# Patient Record
Sex: Female | Born: 1965 | Race: White | Hispanic: Yes | Marital: Single | Smoking: Former smoker
Health system: Southern US, Community
[De-identification: ages and names within clinical notes are randomized; demographics above are authoritative.]

## PROBLEM LIST (undated history)

## (undated) DIAGNOSIS — M1710 Unilateral primary osteoarthritis, unspecified knee: Secondary | ICD-10-CM

## (undated) DIAGNOSIS — K760 Fatty (change of) liver, not elsewhere classified: Secondary | ICD-10-CM

## (undated) DIAGNOSIS — M1990 Unspecified osteoarthritis, unspecified site: Secondary | ICD-10-CM

## (undated) DIAGNOSIS — R0789 Other chest pain: Secondary | ICD-10-CM

## (undated) DIAGNOSIS — M25579 Pain in unspecified ankle and joints of unspecified foot: Secondary | ICD-10-CM

## (undated) DIAGNOSIS — N200 Calculus of kidney: Secondary | ICD-10-CM

## (undated) DIAGNOSIS — E119 Type 2 diabetes mellitus without complications: Secondary | ICD-10-CM

## (undated) HISTORY — DX: Other chest pain: R07.89

## (undated) HISTORY — DX: Unspecified osteoarthritis, unspecified site: M19.90

## (undated) HISTORY — DX: Calculus of kidney: N20.0

## (undated) HISTORY — DX: Fatty (change of) liver, not elsewhere classified: K76.0

## (undated) HISTORY — DX: Unilateral primary osteoarthritis, unspecified knee: M17.10

## (undated) HISTORY — DX: Morbid (severe) obesity due to excess calories: E66.01

## (undated) HISTORY — PX: LITHOTRIPSY: SUR834

## (undated) HISTORY — DX: Pain in unspecified ankle and joints of unspecified foot: M25.579

---

## 1998-06-26 ENCOUNTER — Encounter: Payer: Self-pay | Admitting: Obstetrics & Gynecology

## 1998-06-26 ENCOUNTER — Inpatient Hospital Stay (HOSPITAL_COMMUNITY): Admission: AD | Admit: 1998-06-26 | Discharge: 1998-06-26 | Payer: Self-pay | Admitting: Obstetrics & Gynecology

## 1998-06-27 ENCOUNTER — Inpatient Hospital Stay (HOSPITAL_COMMUNITY): Admission: AD | Admit: 1998-06-27 | Discharge: 1998-06-27 | Payer: Self-pay | Admitting: Obstetrics & Gynecology

## 1998-07-04 ENCOUNTER — Encounter: Payer: Self-pay | Admitting: *Deleted

## 1998-07-04 ENCOUNTER — Inpatient Hospital Stay (HOSPITAL_COMMUNITY): Admission: AD | Admit: 1998-07-04 | Discharge: 1998-07-04 | Payer: Self-pay | Admitting: *Deleted

## 1998-07-12 ENCOUNTER — Inpatient Hospital Stay (HOSPITAL_COMMUNITY): Admission: AD | Admit: 1998-07-12 | Discharge: 1998-07-12 | Payer: Self-pay | Admitting: *Deleted

## 1998-07-14 ENCOUNTER — Inpatient Hospital Stay (HOSPITAL_COMMUNITY): Admission: AD | Admit: 1998-07-14 | Discharge: 1998-07-14 | Payer: Self-pay | Admitting: Obstetrics & Gynecology

## 1998-07-23 ENCOUNTER — Inpatient Hospital Stay (HOSPITAL_COMMUNITY): Admission: AD | Admit: 1998-07-23 | Discharge: 1998-07-23 | Payer: Self-pay | Admitting: Obstetrics

## 1998-07-25 ENCOUNTER — Inpatient Hospital Stay (HOSPITAL_COMMUNITY): Admission: EM | Admit: 1998-07-25 | Discharge: 1998-07-25 | Payer: Self-pay | Admitting: Obstetrics & Gynecology

## 1998-08-23 ENCOUNTER — Ambulatory Visit (HOSPITAL_COMMUNITY): Admission: RE | Admit: 1998-08-23 | Discharge: 1998-08-23 | Payer: Self-pay | Admitting: *Deleted

## 1998-09-21 ENCOUNTER — Inpatient Hospital Stay (HOSPITAL_COMMUNITY): Admission: AD | Admit: 1998-09-21 | Discharge: 1998-09-29 | Payer: Self-pay | Admitting: *Deleted

## 1998-09-21 ENCOUNTER — Encounter: Payer: Self-pay | Admitting: *Deleted

## 1998-09-25 ENCOUNTER — Encounter: Payer: Self-pay | Admitting: Obstetrics & Gynecology

## 1998-10-11 ENCOUNTER — Inpatient Hospital Stay (HOSPITAL_COMMUNITY): Admission: AD | Admit: 1998-10-11 | Discharge: 1998-10-11 | Payer: Self-pay | Admitting: *Deleted

## 1998-10-17 ENCOUNTER — Encounter (INDEPENDENT_AMBULATORY_CARE_PROVIDER_SITE_OTHER): Payer: Self-pay | Admitting: Specialist

## 1998-10-17 ENCOUNTER — Ambulatory Visit (HOSPITAL_COMMUNITY): Admission: AD | Admit: 1998-10-17 | Discharge: 1998-10-17 | Payer: Self-pay | Admitting: Obstetrics & Gynecology

## 1998-10-17 ENCOUNTER — Encounter: Payer: Self-pay | Admitting: Obstetrics & Gynecology

## 1998-10-18 ENCOUNTER — Encounter (INDEPENDENT_AMBULATORY_CARE_PROVIDER_SITE_OTHER): Payer: Self-pay | Admitting: Specialist

## 1998-10-18 ENCOUNTER — Inpatient Hospital Stay (HOSPITAL_COMMUNITY): Admission: AD | Admit: 1998-10-18 | Discharge: 1998-10-23 | Payer: Self-pay | Admitting: *Deleted

## 1998-10-19 ENCOUNTER — Encounter: Payer: Self-pay | Admitting: *Deleted

## 1998-10-20 ENCOUNTER — Encounter: Payer: Self-pay | Admitting: *Deleted

## 1999-03-16 ENCOUNTER — Inpatient Hospital Stay (HOSPITAL_COMMUNITY): Admission: AD | Admit: 1999-03-16 | Discharge: 1999-03-16 | Payer: Self-pay | Admitting: *Deleted

## 1999-04-26 ENCOUNTER — Encounter: Admission: RE | Admit: 1999-04-26 | Discharge: 1999-04-26 | Payer: Self-pay | Admitting: Obstetrics & Gynecology

## 1999-04-27 ENCOUNTER — Encounter: Payer: Self-pay | Admitting: Obstetrics & Gynecology

## 1999-04-27 ENCOUNTER — Ambulatory Visit (HOSPITAL_COMMUNITY): Admission: RE | Admit: 1999-04-27 | Discharge: 1999-04-27 | Payer: Self-pay | Admitting: Obstetrics & Gynecology

## 1999-05-01 ENCOUNTER — Inpatient Hospital Stay (HOSPITAL_COMMUNITY): Admission: AD | Admit: 1999-05-01 | Discharge: 1999-05-01 | Payer: Self-pay | Admitting: Obstetrics & Gynecology

## 1999-05-02 ENCOUNTER — Encounter (INDEPENDENT_AMBULATORY_CARE_PROVIDER_SITE_OTHER): Payer: Self-pay | Admitting: Specialist

## 1999-05-02 ENCOUNTER — Encounter: Payer: Self-pay | Admitting: Obstetrics & Gynecology

## 1999-05-02 ENCOUNTER — Ambulatory Visit (HOSPITAL_COMMUNITY): Admission: RE | Admit: 1999-05-02 | Discharge: 1999-05-02 | Payer: Self-pay | Admitting: Obstetrics & Gynecology

## 1999-05-10 ENCOUNTER — Encounter: Admission: RE | Admit: 1999-05-10 | Discharge: 1999-05-10 | Payer: Self-pay | Admitting: Obstetrics & Gynecology

## 1999-05-16 ENCOUNTER — Encounter: Admission: RE | Admit: 1999-05-16 | Discharge: 1999-05-16 | Payer: Self-pay | Admitting: Internal Medicine

## 1999-06-20 ENCOUNTER — Encounter: Admission: RE | Admit: 1999-06-20 | Discharge: 1999-06-20 | Payer: Self-pay | Admitting: Obstetrics & Gynecology

## 1999-07-07 ENCOUNTER — Encounter: Admission: RE | Admit: 1999-07-07 | Discharge: 1999-07-07 | Payer: Self-pay | Admitting: Obstetrics & Gynecology

## 1999-09-01 ENCOUNTER — Encounter: Admission: RE | Admit: 1999-09-01 | Discharge: 1999-09-01 | Payer: Self-pay | Admitting: Internal Medicine

## 1999-09-05 ENCOUNTER — Encounter: Admission: RE | Admit: 1999-09-05 | Discharge: 1999-09-05 | Payer: Self-pay | Admitting: Obstetrics

## 1999-09-08 ENCOUNTER — Encounter: Admission: RE | Admit: 1999-09-08 | Discharge: 1999-09-08 | Payer: Self-pay | Admitting: Internal Medicine

## 1999-11-24 ENCOUNTER — Encounter: Admission: RE | Admit: 1999-11-24 | Discharge: 1999-11-24 | Payer: Self-pay | Admitting: Obstetrics & Gynecology

## 2000-01-10 ENCOUNTER — Inpatient Hospital Stay (HOSPITAL_COMMUNITY): Admission: AD | Admit: 2000-01-10 | Discharge: 2000-01-10 | Payer: Self-pay | Admitting: Obstetrics

## 2000-01-15 ENCOUNTER — Inpatient Hospital Stay (HOSPITAL_COMMUNITY): Admission: AD | Admit: 2000-01-15 | Discharge: 2000-01-15 | Payer: Self-pay | Admitting: Obstetrics

## 2000-01-23 ENCOUNTER — Encounter: Admission: RE | Admit: 2000-01-23 | Discharge: 2000-01-23 | Payer: Self-pay | Admitting: Obstetrics & Gynecology

## 2000-03-07 ENCOUNTER — Emergency Department (HOSPITAL_COMMUNITY): Admission: EM | Admit: 2000-03-07 | Discharge: 2000-03-07 | Payer: Self-pay

## 2000-03-08 ENCOUNTER — Emergency Department (HOSPITAL_COMMUNITY): Admission: EM | Admit: 2000-03-08 | Discharge: 2000-03-09 | Payer: Self-pay | Admitting: Emergency Medicine

## 2000-03-18 ENCOUNTER — Encounter: Payer: Self-pay | Admitting: Urology

## 2000-03-18 ENCOUNTER — Ambulatory Visit (HOSPITAL_COMMUNITY): Admission: RE | Admit: 2000-03-18 | Discharge: 2000-03-18 | Payer: Self-pay | Admitting: Urology

## 2000-12-12 ENCOUNTER — Emergency Department (HOSPITAL_COMMUNITY): Admission: EM | Admit: 2000-12-12 | Discharge: 2000-12-12 | Payer: Self-pay | Admitting: Emergency Medicine

## 2001-02-02 ENCOUNTER — Emergency Department (HOSPITAL_COMMUNITY): Admission: EM | Admit: 2001-02-02 | Discharge: 2001-02-02 | Payer: Self-pay | Admitting: Emergency Medicine

## 2001-06-04 ENCOUNTER — Encounter: Admission: RE | Admit: 2001-06-04 | Discharge: 2001-06-04 | Payer: Self-pay | Admitting: *Deleted

## 2001-06-04 ENCOUNTER — Encounter: Payer: Self-pay | Admitting: Obstetrics and Gynecology

## 2001-06-04 ENCOUNTER — Inpatient Hospital Stay (HOSPITAL_COMMUNITY): Admission: AD | Admit: 2001-06-04 | Discharge: 2001-06-04 | Payer: Self-pay | Admitting: Obstetrics and Gynecology

## 2001-06-16 ENCOUNTER — Encounter: Payer: Self-pay | Admitting: Obstetrics and Gynecology

## 2001-06-16 ENCOUNTER — Ambulatory Visit (HOSPITAL_COMMUNITY): Admission: RE | Admit: 2001-06-16 | Discharge: 2001-06-16 | Payer: Self-pay | Admitting: Obstetrics and Gynecology

## 2001-06-19 ENCOUNTER — Inpatient Hospital Stay (HOSPITAL_COMMUNITY): Admission: AD | Admit: 2001-06-19 | Discharge: 2001-06-19 | Payer: Self-pay | Admitting: *Deleted

## 2001-06-23 ENCOUNTER — Encounter (INDEPENDENT_AMBULATORY_CARE_PROVIDER_SITE_OTHER): Payer: Self-pay | Admitting: *Deleted

## 2001-06-23 ENCOUNTER — Ambulatory Visit (HOSPITAL_COMMUNITY): Admission: AD | Admit: 2001-06-23 | Discharge: 2001-06-23 | Payer: Self-pay | Admitting: *Deleted

## 2001-09-26 ENCOUNTER — Inpatient Hospital Stay (HOSPITAL_COMMUNITY): Admission: RE | Admit: 2001-09-26 | Discharge: 2001-09-26 | Payer: Self-pay | Admitting: *Deleted

## 2001-09-26 ENCOUNTER — Encounter: Payer: Self-pay | Admitting: *Deleted

## 2001-09-29 ENCOUNTER — Inpatient Hospital Stay (HOSPITAL_COMMUNITY): Admission: AD | Admit: 2001-09-29 | Discharge: 2001-09-29 | Payer: Self-pay | Admitting: Obstetrics and Gynecology

## 2001-10-06 ENCOUNTER — Inpatient Hospital Stay (HOSPITAL_COMMUNITY): Admission: AD | Admit: 2001-10-06 | Discharge: 2001-10-06 | Payer: Self-pay | Admitting: Obstetrics and Gynecology

## 2001-10-13 ENCOUNTER — Inpatient Hospital Stay (HOSPITAL_COMMUNITY): Admission: AD | Admit: 2001-10-13 | Discharge: 2001-10-13 | Payer: Self-pay | Admitting: Obstetrics and Gynecology

## 2001-11-11 ENCOUNTER — Encounter: Admission: RE | Admit: 2001-11-11 | Discharge: 2001-11-11 | Payer: Self-pay | Admitting: *Deleted

## 2001-11-12 ENCOUNTER — Encounter: Admission: RE | Admit: 2001-11-12 | Discharge: 2001-11-12 | Payer: Self-pay | Admitting: Obstetrics and Gynecology

## 2001-12-12 ENCOUNTER — Encounter: Admission: RE | Admit: 2001-12-12 | Discharge: 2001-12-12 | Payer: Self-pay | Admitting: *Deleted

## 2001-12-26 ENCOUNTER — Encounter: Admission: RE | Admit: 2001-12-26 | Discharge: 2001-12-26 | Payer: Self-pay | Admitting: *Deleted

## 2002-01-02 ENCOUNTER — Ambulatory Visit (HOSPITAL_COMMUNITY): Admission: RE | Admit: 2002-01-02 | Discharge: 2002-01-02 | Payer: Self-pay | Admitting: *Deleted

## 2002-01-09 ENCOUNTER — Encounter: Admission: RE | Admit: 2002-01-09 | Discharge: 2002-01-09 | Payer: Self-pay | Admitting: *Deleted

## 2002-01-23 ENCOUNTER — Encounter: Admission: RE | Admit: 2002-01-23 | Discharge: 2002-04-23 | Payer: Self-pay | Admitting: *Deleted

## 2002-01-23 ENCOUNTER — Emergency Department (HOSPITAL_COMMUNITY): Admission: EM | Admit: 2002-01-23 | Discharge: 2002-01-24 | Payer: Self-pay | Admitting: Podiatry

## 2002-08-18 ENCOUNTER — Emergency Department (HOSPITAL_COMMUNITY): Admission: EM | Admit: 2002-08-18 | Discharge: 2002-08-18 | Payer: Self-pay

## 2002-08-18 ENCOUNTER — Encounter: Payer: Self-pay | Admitting: Emergency Medicine

## 2002-10-22 ENCOUNTER — Encounter: Payer: Self-pay | Admitting: Obstetrics & Gynecology

## 2002-10-22 ENCOUNTER — Inpatient Hospital Stay (HOSPITAL_COMMUNITY): Admission: AD | Admit: 2002-10-22 | Discharge: 2002-10-22 | Payer: Self-pay | Admitting: Obstetrics & Gynecology

## 2002-10-27 ENCOUNTER — Encounter: Admission: RE | Admit: 2002-10-27 | Discharge: 2002-10-27 | Payer: Self-pay | Admitting: *Deleted

## 2002-11-02 ENCOUNTER — Ambulatory Visit (HOSPITAL_COMMUNITY): Admission: RE | Admit: 2002-11-02 | Discharge: 2002-11-02 | Payer: Self-pay | Admitting: Obstetrics & Gynecology

## 2002-11-02 ENCOUNTER — Encounter: Payer: Self-pay | Admitting: Obstetrics & Gynecology

## 2002-11-04 ENCOUNTER — Encounter: Admission: RE | Admit: 2002-11-04 | Discharge: 2002-11-04 | Payer: Self-pay | Admitting: Family Medicine

## 2002-11-18 ENCOUNTER — Encounter: Admission: RE | Admit: 2002-11-18 | Discharge: 2002-11-18 | Payer: Self-pay | Admitting: *Deleted

## 2002-12-02 ENCOUNTER — Encounter: Admission: RE | Admit: 2002-12-02 | Discharge: 2002-12-02 | Payer: Self-pay | Admitting: *Deleted

## 2002-12-06 ENCOUNTER — Inpatient Hospital Stay (HOSPITAL_COMMUNITY): Admission: AD | Admit: 2002-12-06 | Discharge: 2002-12-07 | Payer: Self-pay | Admitting: Family Medicine

## 2002-12-09 ENCOUNTER — Encounter: Admission: RE | Admit: 2002-12-09 | Discharge: 2002-12-09 | Payer: Self-pay | Admitting: *Deleted

## 2002-12-16 ENCOUNTER — Encounter: Admission: RE | Admit: 2002-12-16 | Discharge: 2002-12-16 | Payer: Self-pay | Admitting: *Deleted

## 2002-12-30 ENCOUNTER — Ambulatory Visit (HOSPITAL_COMMUNITY): Admission: RE | Admit: 2002-12-30 | Discharge: 2002-12-30 | Payer: Self-pay | Admitting: Family Medicine

## 2002-12-30 ENCOUNTER — Encounter: Admission: RE | Admit: 2002-12-30 | Discharge: 2002-12-30 | Payer: Self-pay | Admitting: *Deleted

## 2002-12-30 ENCOUNTER — Encounter: Payer: Self-pay | Admitting: Family Medicine

## 2003-01-06 ENCOUNTER — Encounter: Admission: RE | Admit: 2003-01-06 | Discharge: 2003-01-06 | Payer: Self-pay | Admitting: *Deleted

## 2003-01-13 ENCOUNTER — Encounter: Admission: RE | Admit: 2003-01-13 | Discharge: 2003-01-13 | Payer: Self-pay | Admitting: *Deleted

## 2003-01-20 ENCOUNTER — Encounter: Admission: RE | Admit: 2003-01-20 | Discharge: 2003-01-20 | Payer: Self-pay | Admitting: *Deleted

## 2003-01-20 ENCOUNTER — Ambulatory Visit (HOSPITAL_COMMUNITY): Admission: RE | Admit: 2003-01-20 | Discharge: 2003-01-20 | Payer: Self-pay | Admitting: *Deleted

## 2003-01-27 ENCOUNTER — Encounter: Admission: RE | Admit: 2003-01-27 | Discharge: 2003-01-27 | Payer: Self-pay | Admitting: *Deleted

## 2003-02-03 ENCOUNTER — Encounter: Admission: RE | Admit: 2003-02-03 | Discharge: 2003-02-03 | Payer: Self-pay | Admitting: *Deleted

## 2003-02-10 ENCOUNTER — Encounter: Admission: RE | Admit: 2003-02-10 | Discharge: 2003-02-10 | Payer: Self-pay | Admitting: *Deleted

## 2003-02-17 ENCOUNTER — Encounter: Admission: RE | Admit: 2003-02-17 | Discharge: 2003-02-17 | Payer: Self-pay | Admitting: *Deleted

## 2003-02-24 ENCOUNTER — Encounter: Admission: RE | Admit: 2003-02-24 | Discharge: 2003-02-24 | Payer: Self-pay | Admitting: *Deleted

## 2003-03-03 ENCOUNTER — Ambulatory Visit (HOSPITAL_COMMUNITY): Admission: RE | Admit: 2003-03-03 | Discharge: 2003-03-03 | Payer: Self-pay | Admitting: *Deleted

## 2003-03-03 ENCOUNTER — Encounter: Admission: RE | Admit: 2003-03-03 | Discharge: 2003-03-03 | Payer: Self-pay | Admitting: *Deleted

## 2003-03-10 ENCOUNTER — Encounter: Admission: RE | Admit: 2003-03-10 | Discharge: 2003-03-10 | Payer: Self-pay | Admitting: *Deleted

## 2003-03-17 ENCOUNTER — Encounter: Admission: RE | Admit: 2003-03-17 | Discharge: 2003-03-17 | Payer: Self-pay | Admitting: *Deleted

## 2003-03-24 ENCOUNTER — Encounter: Admission: RE | Admit: 2003-03-24 | Discharge: 2003-03-24 | Payer: Self-pay | Admitting: *Deleted

## 2003-03-31 ENCOUNTER — Encounter: Admission: RE | Admit: 2003-03-31 | Discharge: 2003-03-31 | Payer: Self-pay | Admitting: *Deleted

## 2003-04-07 ENCOUNTER — Encounter: Admission: RE | Admit: 2003-04-07 | Discharge: 2003-04-07 | Payer: Self-pay | Admitting: *Deleted

## 2003-04-13 ENCOUNTER — Ambulatory Visit (HOSPITAL_COMMUNITY): Admission: RE | Admit: 2003-04-13 | Discharge: 2003-04-13 | Payer: Self-pay | Admitting: *Deleted

## 2003-04-14 ENCOUNTER — Encounter: Admission: RE | Admit: 2003-04-14 | Discharge: 2003-04-14 | Payer: Self-pay | Admitting: *Deleted

## 2003-04-21 ENCOUNTER — Encounter: Admission: RE | Admit: 2003-04-21 | Discharge: 2003-04-21 | Payer: Self-pay | Admitting: *Deleted

## 2003-04-28 ENCOUNTER — Encounter: Admission: RE | Admit: 2003-04-28 | Discharge: 2003-04-28 | Payer: Self-pay | Admitting: *Deleted

## 2003-04-29 ENCOUNTER — Inpatient Hospital Stay (HOSPITAL_COMMUNITY): Admission: AD | Admit: 2003-04-29 | Discharge: 2003-04-30 | Payer: Self-pay | Admitting: Obstetrics & Gynecology

## 2003-05-05 ENCOUNTER — Encounter: Admission: RE | Admit: 2003-05-05 | Discharge: 2003-05-05 | Payer: Self-pay | Admitting: *Deleted

## 2003-05-06 ENCOUNTER — Ambulatory Visit (HOSPITAL_COMMUNITY): Admission: RE | Admit: 2003-05-06 | Discharge: 2003-05-06 | Payer: Self-pay | Admitting: *Deleted

## 2003-05-10 ENCOUNTER — Encounter: Admission: RE | Admit: 2003-05-10 | Discharge: 2003-05-10 | Payer: Self-pay | Admitting: *Deleted

## 2003-05-12 ENCOUNTER — Encounter: Admission: RE | Admit: 2003-05-12 | Discharge: 2003-05-12 | Payer: Self-pay | Admitting: *Deleted

## 2003-05-17 ENCOUNTER — Encounter: Admission: RE | Admit: 2003-05-17 | Discharge: 2003-05-17 | Payer: Self-pay | Admitting: *Deleted

## 2003-05-19 ENCOUNTER — Ambulatory Visit (HOSPITAL_COMMUNITY): Admission: RE | Admit: 2003-05-19 | Discharge: 2003-05-19 | Payer: Self-pay | Admitting: *Deleted

## 2003-05-19 ENCOUNTER — Encounter: Admission: RE | Admit: 2003-05-19 | Discharge: 2003-05-19 | Payer: Self-pay | Admitting: *Deleted

## 2003-05-20 ENCOUNTER — Encounter: Admission: RE | Admit: 2003-05-20 | Discharge: 2003-05-20 | Payer: Self-pay | Admitting: *Deleted

## 2003-05-23 ENCOUNTER — Inpatient Hospital Stay (HOSPITAL_COMMUNITY): Admission: AD | Admit: 2003-05-23 | Discharge: 2003-05-23 | Payer: Self-pay | Admitting: Obstetrics & Gynecology

## 2003-05-24 ENCOUNTER — Encounter: Admission: RE | Admit: 2003-05-24 | Discharge: 2003-05-24 | Payer: Self-pay | Admitting: *Deleted

## 2003-05-26 ENCOUNTER — Encounter: Admission: RE | Admit: 2003-05-26 | Discharge: 2003-05-26 | Payer: Self-pay | Admitting: Obstetrics & Gynecology

## 2003-05-26 ENCOUNTER — Ambulatory Visit (HOSPITAL_COMMUNITY): Admission: RE | Admit: 2003-05-26 | Discharge: 2003-05-26 | Payer: Self-pay | Admitting: *Deleted

## 2003-05-28 ENCOUNTER — Inpatient Hospital Stay (HOSPITAL_COMMUNITY): Admission: AD | Admit: 2003-05-28 | Discharge: 2003-05-28 | Payer: Self-pay | Admitting: Family Medicine

## 2003-05-31 ENCOUNTER — Encounter: Admission: RE | Admit: 2003-05-31 | Discharge: 2003-05-31 | Payer: Self-pay | Admitting: *Deleted

## 2003-06-02 ENCOUNTER — Encounter: Admission: RE | Admit: 2003-06-02 | Discharge: 2003-06-02 | Payer: Self-pay | Admitting: Obstetrics & Gynecology

## 2003-06-02 ENCOUNTER — Inpatient Hospital Stay (HOSPITAL_COMMUNITY): Admission: AD | Admit: 2003-06-02 | Discharge: 2003-06-05 | Payer: Self-pay | Admitting: Obstetrics and Gynecology

## 2003-06-07 ENCOUNTER — Inpatient Hospital Stay (HOSPITAL_COMMUNITY): Admission: RE | Admit: 2003-06-07 | Discharge: 2003-06-07 | Payer: Self-pay | Admitting: Obstetrics and Gynecology

## 2003-07-20 ENCOUNTER — Encounter: Admission: RE | Admit: 2003-07-20 | Discharge: 2003-07-20 | Payer: Self-pay | Admitting: Internal Medicine

## 2004-07-12 ENCOUNTER — Emergency Department (HOSPITAL_COMMUNITY): Admission: EM | Admit: 2004-07-12 | Discharge: 2004-07-12 | Payer: Self-pay | Admitting: Emergency Medicine

## 2005-02-06 ENCOUNTER — Emergency Department (HOSPITAL_COMMUNITY): Admission: EM | Admit: 2005-02-06 | Discharge: 2005-02-06 | Payer: Self-pay | Admitting: Family Medicine

## 2005-09-14 ENCOUNTER — Inpatient Hospital Stay (HOSPITAL_COMMUNITY): Admission: AD | Admit: 2005-09-14 | Discharge: 2005-09-14 | Payer: Self-pay | Admitting: Obstetrics & Gynecology

## 2005-09-21 ENCOUNTER — Inpatient Hospital Stay (HOSPITAL_COMMUNITY): Admission: RE | Admit: 2005-09-21 | Discharge: 2005-09-21 | Payer: Self-pay | Admitting: Obstetrics & Gynecology

## 2005-09-23 ENCOUNTER — Inpatient Hospital Stay (HOSPITAL_COMMUNITY): Admission: AD | Admit: 2005-09-23 | Discharge: 2005-09-23 | Payer: Self-pay | Admitting: Obstetrics and Gynecology

## 2005-10-04 ENCOUNTER — Ambulatory Visit: Payer: Self-pay | Admitting: Gynecology

## 2005-10-04 ENCOUNTER — Encounter (INDEPENDENT_AMBULATORY_CARE_PROVIDER_SITE_OTHER): Payer: Self-pay | Admitting: Gynecology

## 2005-10-08 ENCOUNTER — Ambulatory Visit: Payer: Self-pay | Admitting: Gynecology

## 2005-11-01 ENCOUNTER — Ambulatory Visit: Payer: Self-pay | Admitting: Gynecology

## 2005-11-02 ENCOUNTER — Ambulatory Visit (HOSPITAL_COMMUNITY): Admission: RE | Admit: 2005-11-02 | Discharge: 2005-11-02 | Payer: Self-pay | Admitting: Family Medicine

## 2005-11-17 ENCOUNTER — Inpatient Hospital Stay (HOSPITAL_COMMUNITY): Admission: AD | Admit: 2005-11-17 | Discharge: 2005-11-17 | Payer: Self-pay | Admitting: Obstetrics & Gynecology

## 2005-11-29 ENCOUNTER — Ambulatory Visit: Payer: Self-pay | Admitting: Family Medicine

## 2005-12-09 ENCOUNTER — Inpatient Hospital Stay (HOSPITAL_COMMUNITY): Admission: AD | Admit: 2005-12-09 | Discharge: 2005-12-09 | Payer: Self-pay | Admitting: Obstetrics & Gynecology

## 2005-12-10 ENCOUNTER — Inpatient Hospital Stay (HOSPITAL_COMMUNITY): Admission: AD | Admit: 2005-12-10 | Discharge: 2005-12-10 | Payer: Self-pay | Admitting: Gynecology

## 2005-12-20 ENCOUNTER — Ambulatory Visit (HOSPITAL_COMMUNITY): Admission: RE | Admit: 2005-12-20 | Discharge: 2005-12-20 | Payer: Self-pay | Admitting: Obstetrics & Gynecology

## 2005-12-27 ENCOUNTER — Ambulatory Visit: Payer: Self-pay | Admitting: Family Medicine

## 2005-12-29 ENCOUNTER — Ambulatory Visit: Payer: Self-pay | Admitting: Obstetrics and Gynecology

## 2005-12-29 ENCOUNTER — Inpatient Hospital Stay (HOSPITAL_COMMUNITY): Admission: AD | Admit: 2005-12-29 | Discharge: 2005-12-29 | Payer: Self-pay | Admitting: Obstetrics & Gynecology

## 2006-01-17 ENCOUNTER — Ambulatory Visit: Payer: Self-pay | Admitting: Family Medicine

## 2006-02-07 ENCOUNTER — Ambulatory Visit: Payer: Self-pay | Admitting: *Deleted

## 2006-02-21 ENCOUNTER — Ambulatory Visit: Payer: Self-pay | Admitting: Family Medicine

## 2006-02-25 ENCOUNTER — Ambulatory Visit: Payer: Self-pay | Admitting: Obstetrics & Gynecology

## 2006-02-25 ENCOUNTER — Ambulatory Visit (HOSPITAL_COMMUNITY): Admission: RE | Admit: 2006-02-25 | Discharge: 2006-02-25 | Payer: Self-pay | Admitting: Obstetrics and Gynecology

## 2006-03-07 ENCOUNTER — Ambulatory Visit: Payer: Self-pay | Admitting: Family Medicine

## 2006-03-11 ENCOUNTER — Ambulatory Visit: Payer: Self-pay | Admitting: *Deleted

## 2006-03-18 ENCOUNTER — Ambulatory Visit: Payer: Self-pay | Admitting: Obstetrics & Gynecology

## 2006-03-21 ENCOUNTER — Ambulatory Visit: Payer: Self-pay | Admitting: Obstetrics & Gynecology

## 2006-03-25 ENCOUNTER — Ambulatory Visit (HOSPITAL_COMMUNITY): Admission: RE | Admit: 2006-03-25 | Discharge: 2006-03-25 | Payer: Self-pay | Admitting: Obstetrics and Gynecology

## 2006-03-25 ENCOUNTER — Ambulatory Visit: Payer: Self-pay | Admitting: Obstetrics & Gynecology

## 2006-03-28 ENCOUNTER — Ambulatory Visit: Payer: Self-pay | Admitting: Obstetrics and Gynecology

## 2006-04-01 ENCOUNTER — Ambulatory Visit (HOSPITAL_COMMUNITY): Admission: RE | Admit: 2006-04-01 | Discharge: 2006-04-01 | Payer: Self-pay | Admitting: Obstetrics and Gynecology

## 2006-04-01 ENCOUNTER — Ambulatory Visit: Payer: Self-pay | Admitting: Family Medicine

## 2006-04-04 ENCOUNTER — Ambulatory Visit: Payer: Self-pay | Admitting: Obstetrics & Gynecology

## 2006-04-08 ENCOUNTER — Ambulatory Visit (HOSPITAL_COMMUNITY): Admission: RE | Admit: 2006-04-08 | Discharge: 2006-04-08 | Payer: Self-pay | Admitting: Obstetrics and Gynecology

## 2006-04-08 ENCOUNTER — Ambulatory Visit: Payer: Self-pay | Admitting: Family Medicine

## 2006-04-11 ENCOUNTER — Ambulatory Visit: Payer: Self-pay | Admitting: Obstetrics and Gynecology

## 2006-04-15 ENCOUNTER — Ambulatory Visit (HOSPITAL_COMMUNITY): Admission: RE | Admit: 2006-04-15 | Discharge: 2006-04-15 | Payer: Self-pay | Admitting: Obstetrics and Gynecology

## 2006-04-15 ENCOUNTER — Ambulatory Visit: Payer: Self-pay | Admitting: Obstetrics & Gynecology

## 2006-04-18 ENCOUNTER — Ambulatory Visit: Payer: Self-pay | Admitting: Family Medicine

## 2006-04-22 ENCOUNTER — Ambulatory Visit: Payer: Self-pay | Admitting: Obstetrics & Gynecology

## 2006-04-22 ENCOUNTER — Ambulatory Visit (HOSPITAL_COMMUNITY): Admission: RE | Admit: 2006-04-22 | Discharge: 2006-04-22 | Payer: Self-pay | Admitting: Family Medicine

## 2006-04-24 ENCOUNTER — Ambulatory Visit: Payer: Self-pay | Admitting: Family Medicine

## 2006-04-24 ENCOUNTER — Inpatient Hospital Stay (HOSPITAL_COMMUNITY): Admission: AD | Admit: 2006-04-24 | Discharge: 2006-04-24 | Payer: Self-pay | Admitting: Obstetrics & Gynecology

## 2006-04-25 ENCOUNTER — Ambulatory Visit: Payer: Self-pay | Admitting: Gynecology

## 2006-04-25 ENCOUNTER — Inpatient Hospital Stay (HOSPITAL_COMMUNITY): Admission: AD | Admit: 2006-04-25 | Discharge: 2006-04-25 | Payer: Self-pay | Admitting: Gynecology

## 2006-04-29 ENCOUNTER — Ambulatory Visit: Payer: Self-pay | Admitting: Obstetrics & Gynecology

## 2006-04-29 ENCOUNTER — Encounter: Payer: Self-pay | Admitting: *Deleted

## 2006-04-29 ENCOUNTER — Inpatient Hospital Stay (HOSPITAL_COMMUNITY): Admission: RE | Admit: 2006-04-29 | Discharge: 2006-05-02 | Payer: Self-pay | Admitting: Obstetrics & Gynecology

## 2006-05-08 ENCOUNTER — Ambulatory Visit: Payer: Self-pay | Admitting: Gynecology

## 2007-09-04 ENCOUNTER — Emergency Department (HOSPITAL_COMMUNITY): Admission: EM | Admit: 2007-09-04 | Discharge: 2007-09-04 | Payer: Self-pay | Admitting: Emergency Medicine

## 2007-09-22 ENCOUNTER — Emergency Department (HOSPITAL_COMMUNITY): Admission: EM | Admit: 2007-09-22 | Discharge: 2007-09-22 | Payer: Self-pay | Admitting: Emergency Medicine

## 2009-10-13 ENCOUNTER — Ambulatory Visit: Payer: Self-pay | Admitting: Obstetrics and Gynecology

## 2009-10-14 ENCOUNTER — Encounter: Payer: Self-pay | Admitting: Family Medicine

## 2009-10-14 ENCOUNTER — Ambulatory Visit: Payer: Self-pay | Admitting: Obstetrics and Gynecology

## 2009-11-03 ENCOUNTER — Ambulatory Visit (HOSPITAL_COMMUNITY): Admission: RE | Admit: 2009-11-03 | Discharge: 2009-11-03 | Payer: Self-pay | Admitting: Family Medicine

## 2009-11-18 ENCOUNTER — Ambulatory Visit: Payer: Self-pay | Admitting: Obstetrics & Gynecology

## 2009-11-23 ENCOUNTER — Ambulatory Visit (HOSPITAL_COMMUNITY): Admission: RE | Admit: 2009-11-23 | Discharge: 2009-11-23 | Payer: Self-pay | Admitting: Obstetrics & Gynecology

## 2010-03-31 ENCOUNTER — Emergency Department (HOSPITAL_COMMUNITY)
Admission: EM | Admit: 2010-03-31 | Discharge: 2010-03-31 | Disposition: A | Discharge status: Other Acute Inpatient Hospital | Payer: Self-pay | Source: Home / Self Care | Admitting: Family Medicine

## 2010-03-31 ENCOUNTER — Emergency Department (HOSPITAL_COMMUNITY)
Admission: EM | Admit: 2010-03-31 | Discharge: 2010-04-01 | Payer: Self-pay | Source: Home / Self Care | Admitting: Emergency Medicine

## 2010-04-03 LAB — POCT URINALYSIS DIPSTICK
Ketones, ur: NEGATIVE mg/dL
Nitrite: NEGATIVE
Protein, ur: NEGATIVE mg/dL
Specific Gravity, Urine: 1.015 (ref 1.005–1.030)
Urine Glucose, Fasting: NEGATIVE mg/dL
Urobilinogen, UA: 0.2 mg/dL (ref 0.0–1.0)
pH: 7 (ref 5.0–8.0)

## 2010-04-03 LAB — DIFFERENTIAL
Basophils Absolute: 0 10*3/uL (ref 0.0–0.1)
Monocytes Relative: 6 % (ref 3–12)

## 2010-04-03 LAB — CBC
Hemoglobin: 13.7 g/dL (ref 12.0–15.0)
MCV: 87.9 fL (ref 78.0–100.0)
Platelets: 289 10*3/uL (ref 150–400)
RBC: 4.86 MIL/uL (ref 3.87–5.11)
RDW: 14.1 % (ref 11.5–15.5)

## 2010-04-03 LAB — COMPREHENSIVE METABOLIC PANEL
AST: 40 U/L — ABNORMAL HIGH (ref 0–37)
Alkaline Phosphatase: 98 U/L (ref 39–117)
Calcium: 8.9 mg/dL (ref 8.4–10.5)
Creatinine, Ser: 0.66 mg/dL (ref 0.4–1.2)
GFR calc non Af Amer: >60 mL/min (ref >60)
Sodium: 135 mEq/L (ref 135–145)

## 2010-04-03 LAB — POCT PREGNANCY, URINE: Preg Test, Ur: NEGATIVE

## 2010-07-28 NOTE — Op Note (Signed)
[facility]  Patient:    Amanda Santiago, Amanda Santiago                       MRN: [id number] Proc. Date: 05/02/99 Adm. Date:  [id number] Disc. Date: [id number] Attending:  Antionette Char CC:         [facility]                           Operative Report  PREOPERATIVE DIAGNOSIS:       Intrauterine pregnancy at 15+ weeks with anencephaly.  POSTOPERATIVE DIAGNOSIS:      Intrauterine pregnancy at 15+ weeks with anencephaly.  OPERATION:                    Suction D&E with ultrasound guidance.  SURGEON:                      Roseanna Rainbow, M.D.  ASSISTANT:  ANESTHESIA:                   Spinal.  COMPLICATIONS:                None.  ESTIMATED BLOOD LOSS:         500 cc.  FINDINGS:                     14 to 16 weeks size anteverted uterus.  Copious products of conception.  DESCRIPTION OF PROCEDURE:     The patient was taken to the operating room. Spinal anesthetic was administered without difficulty.  She was then placed in the dorsal lithotomy position and prepped and draped in the usual sterile fashion.  A weighted speculum was then placed in the vagina and Sims retractors used anteriorly. The anterior lip of the cervix was then grasped with a Christella Hartigan tenaculum.  The cervix was then dilated with [facility] dilators.  A 14 mm suction curet was then advanced nto the lower uterine segment and rotated to evacuate the uterus of the products of  conception.  Ovum-crushing forceps were then used to retrieve products of conception as well.  This technique was alternated until as per ultrasound the uterine cavity was completely evacuated of the products of conception.  Sharp curettage was then performed and a gritty texture was noted.  There was moderate bleeding noted which responded to intravenous Pitocin and massage.  The tenaculum was removed with good hemostasis noted.  The patient tolerated the procedure well. The patient was  taken to the PACU in stable condition.  PATHOLOGY:                    Products of conception. DD:  05/04/99 TD:  05/04/99 Job: [id number]

## 2010-07-28 NOTE — Op Note (Signed)
[facility]  Patient:    DENYLA, CORTESE Visit Number: [id number] MRN: [id number]          Service Type: OBS Location: [facility] Attending Physician:  Enid Cutter Dictated by:   Elinor Dodge, M.D. Proc. Date: 06/23/01 Admit Date:  06/23/2001                             Operative Report  PREOPERATIVE DIAGNOSIS:       Missed abortion.  POSTOPERATIVE DIAGNOSES:      1. Missed abortion.                               2. ______ abortion.  PROCEDURE:                    Dilatation and suction curettage.  SURGEON:                      Elinor Dodge, M.D.  DESCRIPTION OF PROCEDURE:     Under satisfactory IV sedation with the patient in the dorsal lithotomy position, bimanual pelvic examination revealed a uterus about six weeks gestational size, freely moveable and anterior.  The adnexa could not be palpated.  A weighted speculum was placed in the posterior fourchette of the vagina.  The anterior lip of the cervix was grasped with a single-tooth tenaculum.  A total of 10 cc was injected into the paracervical areas for local anesthetic at 4 and 8 oclock.  The cervix was clean and nulliparous.  The uterine cavity sounded to a depth of 8 cm.  The cervical oximetry dilated up to a #7 Hegar dilator.  The uterine cavity was evacuated with a suction curet without incident and seemed to empty at the end of the procedure after it was explored with polyp forceps.  The tenaculum and speculum were removed from the vagina and the patient transferred to the recovery room in satisfactory condition, having tolerated the procedure well. Dictated by:   Elinor Dodge, M.D. Attending Physician:  Enid Cutter DD:  06/23/01 TD:  06/23/01 Job: [id number]

## 2010-07-28 NOTE — Op Note (Signed)
Amanda Santiago, Amanda Santiago       ACCOUNT NO.:  [id number]   MEDICAL RECORD NO.:  [id number]          PATIENT TYPE:  INP   LOCATION:                                FACILITY:  [facility]   PHYSICIAN:  Lesly Dukes, M.D. DATE OF BIRTH:  [birth year]   DATE OF PROCEDURE:  04/29/2006  DATE OF DISCHARGE:                               OPERATIVE REPORT   PREOP DIAGNOSIS:  1. Intrauterine pregnancy at term.  2. Gestational diabetes.  3. Advanced maternal age.  4. Transverse infant.  5. Oligohydramnios.  6. Morbid obesity.  7. Desires permanent sterilization.   POSTOP DIAGNOSIS:  1. Intrauterine pregnancy at term.  2. Gestational diabetes.  3. Advanced maternal age.  4. Transverse infant.  5. Oligohydramnios.  6. Morbid obesity.  7. Desires permanent sterilization.   PROCEDURE:  1. Primary classical cesarean section.  2. Bilateral tubal ligation with Filshie clips.   SURGEON:  Lesly Dukes, M.D.   ASSISTANT:  Allie Bossier, MD and Paticia Stack, MD   ANESTHESIA:  Spinal and local.   SPECIMEN:  Placenta sent to L&D.   ESTIMATED BLOOD LOSS:  800 mL.   URINE OUTPUT:  300 mL.   FLUIDS:  4 liters.   COMPLICATIONS:  None.   FINDINGS:  Viable female infant, Apgars 8 and 10 at one and five minutes  respectively.  Birth weight 6 pounds 11 ounces.   REASON FOR PROCEDURE:  This is a 45 year old gravida 7, para 1-1-4-1 at  10 and 1/7th weeks' gestation who presented to out clinic this morning.  She had testing done and was found to have low fluid with an AFI of 5.  Ultrasound showed baby in transverse position with the back down.  She  was sent for primary cesarean section.   DESCRIPTION OF PROCEDURE:  The patient was taken to the operating room  where her spinal anesthesia was found to be adequate.  She was then  prepped and draped in a normal sterile fashion in dorsal supine position  with a leftward tilt.  A Pfannenstiel skin incision was then made with  the scalpel, and  carried through the underlying layer of fascia.  The  fascia was incised in the midline and the incision extended laterally  with Mayo scissors.  The superior aspect of the fascial incision was  then grasped with the Kocher clamps, elevated, and the underlying rectus  muscles dissected off bluntly.   Attention was then turned to the inferior aspect of the incision which,  in a similar fashion was grasped, tented up with Kocher clamps, and the  rectus muscles dissected off bluntly.  The rectus muscles were then  separated in the midline; and the peritoneum identified, tented up, and  entered sharply with the Metzenbaum scissors.  The peritoneal incision  was then extended superiorly and inferiorly with good visualization of  the bladder.  The bladder blade was inserted, and the vesicouterine  peritoneum identified, grasped with the pickups and entered sharply with  the Metzenbaum scissors.  The incision was then extended laterally and  the bladder flap created digitally, inserted in the lower uterine  segment.  The bladder blade was reinserted and a vertical uterine  incision was made with a scalpel.  The incision was then extended  superiorly with the bandage scissors.   The bladder blade was removed.  The infant was found to be transverse,  back down, with the head to the maternal left.  Baby was rotated and  delivered cephalically.  The nose and mouth were suctioned; and the cord  clamped and cut.  The infant was handed off to the waiting  pediatricians.  Cord blood was obtained.  The placenta was then removed  manually.  The uterus exteriorized and cleared of all clots and debris.  The uterine incision was repaired with #0 chromic in a running locked  fashion. The second imbricating layer of the same suture was then used  to obtain excellent hemostasis.   Attention was then turned to the tubal ligation.  The patient's left  fallopian tube was identified, grasped with Babcock clamps,  and Filshie  clips placed in the isthmus region of the tube; excellent hemostasis was  noted.  The patient's right fallopian tube was identified, grasped with  Babcock clamps, and Filshie clip placed in the isthmus region of the  tube; excellent hemostasis was noted.  The uterus was returned to the  abdomen, and the uterine incision was reinspected with excellent  hemostasis noted.  The gutters were cleared of all clots; and the fascia  was reapproximated with #0 Vicryl in a running locked fashion.  The skin  was closed with staples.  The patient tolerated the procedure well.  Sponge, lap and needle counts were correct x2; 1 gram of Ancef was given  at cord clamp.  The patient was taken to recovery room in stable  condition.     ______________________________  Paticia Stack, MD    ______________________________  Lesly Dukes, M.D.    LNJ/MEDQ  D:  04/29/2006  T:  04/29/2006  Job:  [id number]

## 2010-07-28 NOTE — Discharge Summary (Signed)
Amanda Santiago, Amanda Santiago       ACCOUNT NO.:  [id number]   MEDICAL RECORD NO.:  [id number]          PATIENT TYPE:  INP   LOCATION:  9147                          FACILITY:  [facility]   PHYSICIAN:  Allie Bossier, MD        DATE OF BIRTH:  1965/08/02   DATE OF ADMISSION:  04/29/2006  DATE OF DISCHARGE:                               DISCHARGE SUMMARY   DISCHARGE DIAGNOSES:  1. Primary classical cesarean section.  2. Term delivery.  3. Decreased amniotic fluid with transverse lie in back-down position.  4. Status post bilateral tubal ligation.   DISCHARGE MEDICATIONS:  1. Motrin 600 mg p.o. every 6 hours as needed for pain.  2. Percocet 5/325 mg one to two tablets every 4-6 hours as needed for      pain.  3. Prenatal vitamins daily while breastfeeding.  4. Colace 100 mg p.o. b.i.d. for constipation.   BRIEF HOSPITAL COURSE:  This is a 45 year old Hispanic female who  presented as a G7, P1-1-4-2 at 69 and one-seventh weeks to clinic with  gestational diabetes for BPP and NST.  The patient was found to have  decreased amniotic fluid on BPP as well as transverse lie in the back-  down position.  Thus, the patient was sent for primary classical C-  section.  The patient tolerated the procedure well and underwent  bilateral tubal ligation during the procedure.  The patient delivered a  viable female infant with Apgars of 8 at one minute and 10 at five  minutes.  The patient declined circumcision for her infant.  The patient  is bottle feeding at the time of discharge.   PERTINENT LABORATORY DATA:  OB panel revealed O positive blood type,  antibody negative.  RPR nonreactive.  Rubella immune.  Hepatitis B  surface antigen negative.  GBS negative.  HIV was declined.  Hemoglobin  was 12.1 on April 30, 2006, postoperative day #1 prior to discharge.  The patient did undergo PIH labs postoperatively for mildly elevated  blood pressures in the 130s to 140s systolic with normal diastolics and  PIH labs were within normal limits.  Thus, no magnesium was needed.   The patient will need 2-hour glucose tolerance test at 6 weeks  postpartum visit.   INFANT DATA:  The patient delivered female infant weighing 6 pounds 11  ounces and 19.25 inches in length.  The infant was not circumcised and  was discharged home with the mother with no complications.   DISCHARGE INSTRUCTIONS:  The patient is to avoid heavy lifting and avoid  sexual activity for 6 weeks postpartum.  The patient may follow a  routine diabetic diet.  The patient is to take medications as prescribed  above.  The [facility]  The  patient's [facility] nurse is to remove her staples on postoperative day  #7 or #8, which will be May 06, 2006.   The patient was discharged home in stable condition with her infant.     ______________________________  Drue Dun, M.D.      Allie Bossier, MD  Electronically  Signed    EE/MEDQ  D:  05/02/2006  T:  05/02/2006  Job:  [id number]

## 2010-07-28 NOTE — H&P (Signed)
Amanda Santiago, Amanda Santiago       ACCOUNT NO.:  [id number]   MEDICAL RECORD NO.:  [id number]          PATIENT TYPE:  INP   LOCATION:                                FACILITY:  [facility]   PHYSICIAN:  Allie Bossier, MD        DATE OF BIRTH:  07-Mar-1966   DATE OF ADMISSION:  04/29/2006  DATE OF DISCHARGE:                              HISTORY & PHYSICAL   Mrs. Pitstick is a 45 year old, married Hispanic, gravida 7, para 1-  1-4-1. She arrived to her regularly scheduled routine OB visit today,  mentioning that she had a gush of fluid last night.  Sterile speculum  exam was negative for Nitrazine, negative for fern, negative for pool,  negative for Valsalva.  However, an ultrasound done today showed not  only a transverse lie of the baby, but oligohydramnios (5.5 cm).  Her  AFI last week was 11.5 cm.  Her pregnancy has been complicated by the  fact that she is 45 years old. She has a history of second trimester  loss.  She has gestational diabetes, class A2, and she is morbidly  obese.  She also wishes permanent sterility in the form of a tubal  ligation.  I have discussed with her the 1% failure rate. She declines  alternative forms of birth control.   PAST MEDICAL HISTORY:  As above.   PAST SURGICAL HISTORY:  1. Urethral dilation done at [facility].  2. She had suction D&E for an anencephalic baby in 9528.  3. She had a D&C done in 2003 for miscarriage.   ALLERGIES:  No known drug allergies. No latex allergies.   SOCIAL HISTORY:  Negative for tobacco, alcohol, or drug use.   PHYSICAL EXAMINATION:  VITAL SIGNS:  Weight 305 pounds, blood pressure  141/93.  HEENT:  Normal.  HEART:  Regular rate and rhythm.  ABDOMEN:  Benign.  Obese.  Gravid. The baby does have a transverse lie.  Biophysical profile is 6/8.  Capillary blood glucose today is 77.  Pulse  81.   ASSESSMENT/PLAN:  1. Pregnancy 38+ weeks with gestational diabetes, advanced maternal      age, pregnancy-induced  hypertension, and oligohydramnios.  Will      proceed with primary low transverse cesarean in light of the baby's      transverse lie.  2. Multiparity, desires sterility.  Will plan for BPS.  Risks,      benefits and alternatives of surgery is explained and accepted.      Allie Bossier, MD  Electronically Signed     MCD/MEDQ  D:  04/29/2006  T:  04/29/2006  Job:  [id number]

## 2013-07-03 ENCOUNTER — Ambulatory Visit: Payer: Self-pay

## 2013-09-01 ENCOUNTER — Ambulatory Visit: Payer: Self-pay | Admitting: Internal Medicine

## 2013-09-14 ENCOUNTER — Encounter: Payer: Self-pay | Admitting: Internal Medicine

## 2013-09-14 ENCOUNTER — Ambulatory Visit: Payer: No Typology Code available for payment source | Attending: Internal Medicine | Admitting: Internal Medicine

## 2013-09-14 VITALS — BP 108/68 | HR 66 | Temp 97.8°F | Resp 22 | Ht 64.0 in | Wt 318.8 lb

## 2013-09-14 DIAGNOSIS — R1011 Right upper quadrant pain: Secondary | ICD-10-CM

## 2013-09-14 DIAGNOSIS — Z87891 Personal history of nicotine dependence: Secondary | ICD-10-CM | POA: Insufficient documentation

## 2013-09-14 DIAGNOSIS — M129 Arthropathy, unspecified: Secondary | ICD-10-CM | POA: Insufficient documentation

## 2013-09-14 DIAGNOSIS — Z1239 Encounter for other screening for malignant neoplasm of breast: Secondary | ICD-10-CM

## 2013-09-14 DIAGNOSIS — M79609 Pain in unspecified limb: Secondary | ICD-10-CM | POA: Insufficient documentation

## 2013-09-14 DIAGNOSIS — M25569 Pain in unspecified knee: Secondary | ICD-10-CM | POA: Insufficient documentation

## 2013-09-14 DIAGNOSIS — M79671 Pain in right foot: Secondary | ICD-10-CM

## 2013-09-14 DIAGNOSIS — Z7689 Persons encountering health services in other specified circumstances: Secondary | ICD-10-CM

## 2013-09-14 DIAGNOSIS — Z0000 Encounter for general adult medical examination without abnormal findings: Secondary | ICD-10-CM | POA: Insufficient documentation

## 2013-09-14 DIAGNOSIS — Z7189 Other specified counseling: Secondary | ICD-10-CM

## 2013-09-14 LAB — CBC
HCT: 43.3 % (ref 36.0–46.0)
Hemoglobin: 14.6 g/dL (ref 12.0–15.0)
MCH: 29.5 pg (ref 26.0–34.0)
MCHC: 33.7 g/dL (ref 30.0–36.0)
MCV: 87.5 fL (ref 78.0–100.0)
Platelets: 235 10*3/uL (ref 150–400)
RBC: 4.95 MIL/uL (ref 3.87–5.11)
RDW: 14.6 % (ref 11.5–15.5)
WBC: 7.3 10*3/uL (ref 4.0–10.5)

## 2013-09-14 LAB — LIPID PANEL
Cholesterol: 88 mg/dL (ref 0–200)
HDL: 41 mg/dL (ref >39)
LDL CALC: 24 mg/dL (ref 0–99)
TRIGLYCERIDES: 116 mg/dL (ref <150)
Total CHOL/HDL Ratio: 2.1 Ratio
VLDL: 23 mg/dL (ref 0–40)

## 2013-09-14 LAB — COMPLETE METABOLIC PANEL WITH GFR
ALT: 39 U/L — ABNORMAL HIGH (ref 0–35)
AST: 32 U/L (ref 0–37)
Albumin: 4 g/dL (ref 3.5–5.2)
Alkaline Phosphatase: 79 U/L (ref 39–117)
BILIRUBIN TOTAL: 0.5 mg/dL (ref 0.2–1.2)
BUN: 11 mg/dL (ref 6–23)
CALCIUM: 8.4 mg/dL (ref 8.4–10.5)
CO2: 26 meq/L (ref 19–32)
Chloride: 102 mEq/L (ref 96–112)
Creat: 0.41 mg/dL — ABNORMAL LOW (ref 0.50–1.10)
GFR, Est African American: >89 mL/min
GFR, Est Non African American: >89 mL/min
GLUCOSE: 108 mg/dL — AB (ref 70–99)
Potassium: 4.1 mEq/L (ref 3.5–5.3)
SODIUM: 139 meq/L (ref 135–145)
TOTAL PROTEIN: 7.4 g/dL (ref 6.0–8.3)

## 2013-09-14 LAB — HEMOGLOBIN A1C
Hgb A1c MFr Bld: 6.3 % — ABNORMAL HIGH (ref <5.7)
Mean Plasma Glucose: 134 mg/dL — ABNORMAL HIGH (ref <117)

## 2013-09-14 LAB — TSH: TSH: 0.968 u[IU]/mL (ref 0.350–4.500)

## 2013-09-14 NOTE — Patient Instructions (Signed)
Fascitis plantar (sndrome del espoln en el taln) con rehabilitacin (Plantar Fasciitis, Heel Spur Syndrome, with Rehab) La fascia plantar es una estructura fibrosa, tipo ligamento, de tejido blando que abarca la parte inferior del pie. La fascitis plantar es una enfermedad que ocasiona dolor en el pie debido a la inflamacin del tejido. SNTOMAS  Dolor y sensibilidad en la planta del pie.  Dolor especialmente al ponerse de pie o caminar. CAUSAS La fascitis plantar est causada por irritacin y lesin en la fascia plantar debajo del pie. Los mecanismos ms frecuentes de una lesin son:  Golpe directo en la planta del pie.  Dao a un pequeo nervio que [employer], Mudlogger en que la fascia principal se une al hueso del taln.  Estrs aplicado en la fascia plantar debido a espolones seos. EL RIESGO AUMENTA CON:   Actividades que estresan la fascia plantar (correr, saltar, pivotar o cortar).  Poca fuerza y flexibilidad.  Calzado mal ajustado.  Msculos de la pantorrilla tensos.  Pie plano.  No hacer un precalentamiento adecuado.  Obesidad. PREVENCIN  Precalentamiento adecuado y elongacin antes de la [place].  Descanso y recuperacin entre actividades.  Mantener la forma fsica:  Kerry Hough, flexibilidad y resistencia muscular.  Capacidad cardiovascular.  Mantenga un peso corporal adecuado.  Evite el estrs en la fascia plantar.  Para deportistas con pie plano, utilizacin de plantillas anatmicas para los arcos. PRONSTICO Si se trata adecuadamente, generalmente es curable sin [place]. En ocasiones requiere someterse a [place]. POSIBLES COMPLICACIONES  La recurrencia frecuente de los sntomas puede dar como resultado un problema crnico.  Problemas en la cintura causados para compensar la lesin, como renguera.  Dolor o debilidad en el pie al adelantar el pie luego de la [place].  Inflamacin crnica, cicatrizacin y ruptura parcial o completa  de la fascia, que se produce luego de repetidas inyecciones. TRATAMIENTO El tratamiento inicial incluye el uso de medicamentos y la aplicacin de hielo para reducir Conservation officer, historic buildings y la inflamacin. Los ejercicios de elongacin y fortalecimiento pueden ayudar a reducir Conservation officer, historic buildings con la actividad, en especial los dirigidos al tendn de [place]. Los ejercicios pueden Press photographer o con un terapeuta. El [employer] podr recomendar que utilice tacos o soportes para el arco para ayudar a Software engineer de la fascia plantar. En algunos casos se indica una inyeccin de corticoides para reducir la inflamacin. Si los sntomas persisten por ms de 6 meses de tratamiento no quirrgico (conservador), se Biochemist, clinical.  MEDICAMENTOS  Si necesita analgsicos, se recomiendan los antiinflamatorios no esteroides, como aspirina e ibuprofeno y otros calmantes menores, como acetaminofeno  No tome medicamentos para Conservation officer, historic buildings dentro de los 7 das previos a la [place].  Los analgsicos prescriptos se indicarn si el mdico lo considera necesario. Utilcelos como se le indique y slo cuando lo necesite.  En algunos casos se indica una inyeccin de corticosteroides. Estas inyecciones deben reservarse para los casos graves, porque slo se pueden administrar una determinada cantidad de veces. CALOR Y FRO  El tratamiento con fro [employer] y reduce la inflamacin. El fro debe aplicarse durante 10 a 15 minutos cada 2  3 horas para reducir la inflamacin y Conservation officer, historic buildings e inmediatamente despus de cualquier actividad que agrava los sntomas. Utilice bolsas de hielo o masajee la zona con un trozo de hielo (masaje de hielo).  El calor puede usarse antes de Neurosurgeon y [place] fortalecimiento indicadas por el profesional, le fisioterapeuta  o el entrenador. Utilice una bolsa trmica o sumerja la lesin en agua caliente. SOLICITE ATENCIN MDICA DE INMEDIATO SI:  El tratamiento no lo beneficia, o el  trastorno empeora.  Los medicamentos producen efectos secundarios. EJERCICIOS EJERCICIOS DE AMPLITUD DE MOVIMIENTOS Y ELONGACIN - Fascitis plantar (sndrome del espoln en el taln) Estos ejercicios le ayudarn en la recuperacin de la lesin. Los sntomas podrn aliviarse con o sin asistencia adicional de su mdico, fisioterapeuta o entrenador. Al completar estos ejercicios, recuerde:   Restaurar la flexibilidad del tejido ayuda a que las articulaciones recuperen el movimiento normal. Esto permite que el movimiento y la actividad sea ms saludables y menos dolorosos.  Para que sea efectiva, cada elongacin debe realizarse durante al menos 30 segundos.  La elongacin nunca debe ser dolorosa. Deber sentir slo un alargamiento o distensin suave del tejido que estira. AMPLITUD DE MOVIMIENTOS - Extensin de los dedos - flexin  Sintese con la pierna derecha / izquierdo cruzada sobre la rodilla opuesta.  Tome los dedos de los pies y empjelos hacia usted. Debe sentir un estiramiento suave en la zona inferior de los dedos y del pie.  Mantenga esta posicin durante __________ segundos.  Luego, tome los dedos de los pies y empjelos hacia abajo. Debe sentir un estiramiento suave en la zona superior de los dedos y del pie.  Mantenga esta posicin durante __________ segundos. Reptalo __________ veces. Realice este estiramiento __________ veces por da.  AMPLITUD DE MOVIMIENTOS - Dorsiflexin del tobillo - activa, asistida  Qutese los zapatos y sintese en una silla, preferiblemente en una superficie sin alfombra.  Coloque el pie derecha / izquierdo debajo de la rodilla. Extienda la pierna contraria para estar apoyado.  Con el taln hacia abajo, deslice el pie derecha / izquierdo hacia la silla hasta que sienta un estiramiento en el tobillo o pantorrilla. Si no lo siente, deslice la cadera hacia adelante hasta el borde de la silla, manteniendo el taln hacia abajo.  Mantenga esta posicin  durante __________ segundos. Reptalo __________ veces. Realice este estiramiento __________ veces por da.  ELONGACIN - Gastroc De pie  Coloque las manos en la pared.  Extienda la pierna derecha / izquierdo y mantenga la rodilla levemente flexionada.  Apunte los dedos ligeramente hacia adentro con el pie de atrs.  Mantenga el taln derecha / izquierdo en el suelo y la rodilla recta, cambie el peso hacia la pared y no permita que la espalda se arquee.  Debe sentir un estiramiento en la pantorrila derecha / izquierdo. Mantenga esta posicicin durante __________ segundos. Reptalo __________ veces. Realice este estiramiento __________ veces por da. ELONGACIN - Sleo De pie  Coloque las manos en la pared.  Extienda la pierna derecha / izquierdo y mantenga la rodilla levemente flexionada.  Apunte los dedos ligeramente hacia adentro con el pie de atrs.  Mantenga el taln derecha / izquierdo en el suelo, doble la rodilla de atrs y cambie suavemente el peso sobre la pierna de atrs hasta que sienta un ligero estiramiento en la pantorrilla de atrs.  Mantenga esta posicicin durante __________ segundos. Reptalo __________ veces. Realice este estiramiento __________ veces por da. ELONGACIN - Gastrocsoleus De pie Nota: Este ejercicio puede ser muy estresante para el pie y el tobillo. Realice los ejercicios slo en la forma indicada por el profesional que lo asiste.   Coloque la regin metatarsiana del pie derecha / izquierdo y el otro en el mismo escaln.  Si es necesario, sostngase de la pared o de la barandilla de   una escalera para mantener el equilibrio.  Levante lentamente el [employer], y permita que el peso del cuerpo presione el taln sobre el borde del escaln.  Debe sentir un estiramiento en la pantorrilla derecha / izquierdo.  Mantenga esta posicicin durante __________ segundos.  Repita este ejercicio con la rodilla derecha / izquierdo levemente flexionada. Reptalo  __________ veces. Realice este estiramiento __________ Vicenta Aly por da.  EJERCICIOS DE FORTALECIMIENTO - Fascitis plantar (sndrome del espoln en el taln) Estos ejercicios le ayudarn en la recuperacin de la lesin. Los sntomas podrn desaparecer con o sin mayor intervencin del profesional, el fisioterapeuta o Industrial/product designer. Al completar estos ejercicios, recuerde:   Los msculos pueden ganar tanto la resistencia como la fortaleza que necesita para sus actividades diarias a travs de ejercicios controlados.  Realice los ejercicios como se lo indic el mdico, el fisioterapeuta o Industrial/product designer. Aumente la resistencia y las repeticiones segn se le haya indicado. [place] de toalla  Sintese en una silla en una superficie no alfombrada.  Coloque el pie en Rozann Lesches, y mantenga el taln en el suelo.  Coloque la toalla alrededor del taln pero envuelva slo los dedos del pie. Mantenga el taln contra el piso.  Aada ____________________ al extremo de la toalla si el mdico, fisioterapeuta o entrenador se lo indican. Reptalo __________ veces. Realice este ejercicio __________ veces por da. FUERZA - Inversin del tobillo  Asegure un extremo de una banda de goma para ejercicios a un objeto fijo (mesa, columna). Ate el extremo opuesto a su pie, justo antes de los dedos.  Coloque los puos [employer] rodillas. Esto har que la fuerza se concentre en el tobillo.  Lentamente, tire del dedo gordo [place] y Johny Shears y asegrese de que la banda est posicionada de tal forma que puede resistir el movimiento completo.  Mantenga esta posicicin durante __________ segundos.  Haga que los msculos resistan la banda mientras tira lentamente el pie hacia atrs hasta la posicin inicial. Reptalo __________ veces. Realice este ejercicio __________ veces por da.  Document Released: 12/13/2005 Document Revised: 05/21/2011 [facility] Patient Information 2015 [place]. This information  is not intended to replace advice given to you by your health care provider. Make sure you discuss any questions you have with your health care provider. Ejercicios para perder peso (Exercise to Lose Weight) La actividad fsica y Ardelia Mems dieta saludable ayudan a perder peso. El mdico podr sugerirle ejercicios especficos. IDEAS Y CONSEJOS PARA HACER EJERCICIOS  Elija opciones econmicas que disfrute hacer , como caminar, andar en bicicleta o los vdeos para ejercitarse.  Utilice las Clinical cytogeneticist del ascensor.  Camine durante la hora del almuerzo.  Estacione el auto lejos del lugar de [place] o [place].  Concurra a un gimnasio o tome clases de gimnasia.  Comience con 5  10 minutos de actividad fsica por da. Ejercite hasta 30 minutos, 4 a 6 das por semana.  Utilice zapatos que tengan un buen soporte y ropas cmodas.  Elongue antes y despus de Chief Technology Officer.  Ejercite hasta que aumente la respiracin y el corazn palpite rpido.  Beba agua extra cuando ejercite.  No haga ejercicio Contractor, sentirse mareado o que le falte mucho el aire. La actividad fsica puede quemar alrededor de 150 caloras.  Correr 20 cuadras en 15 minutos.  Jugar vley durante 45 a 60 minutos.  Limpiar y encerar el auto durante 45 a 60 minutos.  Jugar ftbol americano de toque.  Caminar 25 cuadras en 35  minutos.  Empujar un cochecito 20 cuadras en 30 minutos.  Jugar baloncesto durante 30 minutos.  Rastrillar hojas secas durante 30 minutos.  Andar en bicicleta 80 cuadras en 30 minutos.  Caminar 30 cuadras en 30 minutos.  Bailar durante 30 minutos.  Quitar la nieve con una pala durante 15 minutos.  Nadar vigorosamente durante 20 minutos.  Subir escaleras durante 15 minutos.  Andar en bicicleta 60 cuadras durante 15 minutos.  Arreglar el jardn entre 30 y 69 minutos.  Saltar a la soga durante 15 minutos.  Limpiar vidrios o pisos durante 45 a 60 minutos. Document Released:  06/02/2010 Document Revised: 05/21/2011 [facility] Patient Information 2015 [place]. This information is not intended to replace advice given to you by your health care provider. Make sure you discuss any questions you have with your health care provider.

## 2013-09-14 NOTE — Progress Notes (Signed)
Patient ID: Amanda Santiago, female   DOB: 1965/12/06, 48 y.o.   MRN: [id number]   SFK:[id number]  [id number]  DOB - 01/31/1966  CC:  Chief Complaint  Patient presents with  . Establish Care  . Arthritis  . Knee Pain       HPI: Amanda Santiago is a 48 y.o. female with a past medical history of arthritis, here today to establish medical care. Patient reports that she has bilateral knee pain from arthritis.  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  For the past 8 months she has had pain in her right heel that is preventing her from exercising and walking. She would like to be referred back to podiatry for injections.  Patient c/o of itching on face for past two months and is unsure if it is related to chemicals in swimming pool.  She c/o a non-tender bump on left side of head. Pain in RUQ-in the past was told that she needs to have her gallbladder removed.  Feels like pressure and sharp pain with movement, not associated with food intake. Usually has BM two times per day, last two days have been constipated.   Allergies  Allergen Reactions  . Alka4-Complex   . Aspirin Swelling  . Shellfish-Derived Products Swelling   Past Medical History  Diagnosis Date  . Arthritis   . Kidney stone 9-10 years ago   No current outpatient prescriptions on file prior to visit.   No current facility-administered medications on file prior to visit.   No family history on file. History   Social History  . Marital Status: Married    Spouse Name: N/A    Number of Children: N/A  . Years of Education: N/A   Occupational History  . Not on file.   Social History Main Topics  . Smoking status: Former Smoker -- 1.50 packs/day for 18 years    Types: Cigarettes  . Smokeless tobacco: Former Systems developer    Quit date: 03/12/1997  . Alcohol Use: Not on file  . Drug Use: Not on file  . Sexual Activity:  Not on file   Other Topics Concern  . Not on file   Social History Narrative  . No narrative on file    Review of Systems: See HPI  Objective:   Filed Vitals:   09/14/13 0907  BP: 108/68  Pulse: 66  Temp: 97.8 F (36.6 C)  Resp: 22    Physical Exam: Constitutional: Patient appears well-developed and well-nourished. No distress. HENT: Normocephalic, atraumatic, External right and left ear normal. Oropharynx is clear and moist.  Eyes: Conjunctivae and EOM are normal. PERRLA, no scleral icterus. Neck: Normal ROM. Neck supple. No JVD. No tracheal deviation. No thyromegaly. CVS: RRR, S1/S2 +, no murmurs, no gallops, no carotid bruit.  Pulmonary: Effort and breath sounds normal, no stridor, rhonchi, wheezes, rales.  Abdominal: Soft. BS +, no distension, tenderness, rebound or guarding.  Musculoskeletal: Normal range of motion. No edema and no tenderness.  Lymphadenopathy: No lymphadenopathy noted, cervical Neuro: Alert. Normal reflexes, muscle tone coordination. No cranial nerve deficit. Skin: Skin is warm and dry. No rash noted. Not diaphoretic. No erythema. No pallor. Psychiatric: Normal mood and affect. Behavior, judgment, thought content normal.  Lab Results  Component Value Date   WBC 10.3 03/31/2010   HGB 13.7 03/31/2010   HCT 42.7 03/31/2010   MCV 87.9 03/31/2010   PLT 289  03/31/2010   Lab Results  Component Value Date   CREATININE 0.66 03/31/2010   BUN 11 03/31/2010   NA 135 03/31/2010   K 4.8 HEMOLYZED SPECIMEN, RESULTS MAY BE AFFECTED 03/31/2010   CL 102 03/31/2010   CO2 22 03/31/2010    No results found for this basename: HGBA1C   Lipid Panel  No results found for this basename: chol, trig, hdl, cholhdl, vldl, ldlcalc       Assessment and plan:   Amanda Santiago was seen today for establish care, arthritis, knee pain and abdominal pain.  Diagnoses and associated orders for this visit:  Encounter to establish care - CBC - Hemoglobin A1C - COMPLETE METABOLIC  PANEL WITH GFR - TSH - Lipid panel  RUQ abdominal pain - US Abdomen Limited RUQ; Future  Heel pain, right - Ambulatory referral to Podiatry Explained that patient may use diclofenac BID for two weeks to help with inflammation and pain Breast cancer screening - MM Digital Screening; Future   Return in about 4 weeks (around 10/12/2013) for pap smear/breast exam.   Chari Manning, [place] [facility] [phone] 09/14/2013, 9:31 AM

## 2013-09-14 NOTE — Progress Notes (Signed)
Patient presents to establish care C/O bilateral knee pain for 8 years right > left  C/O bilateral foot pain for 8 months States she was seen in ED a year ago for RUQ pain and told she needed gallbladder surgery. RUQ pain has been constant for last 3 days Interpreter present.

## 2013-09-15 ENCOUNTER — Other Ambulatory Visit: Payer: Self-pay | Admitting: Internal Medicine

## 2013-09-15 ENCOUNTER — Telehealth: Payer: Self-pay | Admitting: *Deleted

## 2013-09-15 DIAGNOSIS — R7303 Prediabetes: Secondary | ICD-10-CM

## 2013-09-15 MED ORDER — METFORMIN HCL ER 500 MG PO TB24: 500.0000 mg | ORAL_TABLET | Freq: Every day | ORAL | Status: DC

## 2013-09-15 NOTE — Telephone Encounter (Signed)
Message copied by Velora Heckler on Tue Sep 15, 2013  2:47 PM ------      Message from: Chari Manning A      Created: Tue Sep 15, 2013 10:41 AM       Please call patient and inform her that she is prediabetic. Explain to patient what the Hemoglobin a1c is and the normal value.  Explain to patient that she is high risk for developing diabetes and I would like her to begin taking Metformin XR 500 mg daily to help prevent the onset.  Explain to patient that Metformin may help with mild weight loss as well. Stress to patient the importance of weight loss and diet in slowing the onset of diabetes.  All other labs are normal. Thanks. ------

## 2013-09-15 NOTE — Telephone Encounter (Signed)
Patient notified of lab results and instructions via [employer]. Patient states understanding. States she will pick up med today.

## 2013-09-16 ENCOUNTER — Ambulatory Visit (HOSPITAL_COMMUNITY)
Admission: RE | Admit: 2013-09-16 | Discharge: 2013-09-16 | Disposition: A | Discharge status: Home / Self Care | Payer: No Typology Code available for payment source | Source: Ambulatory Visit | Attending: Internal Medicine | Admitting: Internal Medicine

## 2013-09-16 DIAGNOSIS — R1011 Right upper quadrant pain: Secondary | ICD-10-CM

## 2013-09-16 DIAGNOSIS — K7689 Other specified diseases of liver: Secondary | ICD-10-CM | POA: Insufficient documentation

## 2013-09-24 ENCOUNTER — Telehealth: Payer: Self-pay

## 2013-09-24 NOTE — Telephone Encounter (Signed)
Interpreter line used  Patient is aware of her ultra sound results

## 2013-09-24 NOTE — Telephone Encounter (Signed)
Message copied by Dorothe Pea on Thu Sep 24, 2013 10:34 AM ------      Message from: Chari Manning A      Created: Wed Sep 23, 2013  7:14 PM       Reason for patient that her abdominal ultrasound showed fatty liver. Explained to patient weight loss and dietary changes will help with fatty liver. Explained to patient pain iis usually not associated with fatty liver. Please advise patient to avoid alcohol ------

## 2013-10-05 ENCOUNTER — Ambulatory Visit
Admission: RE | Admit: 2013-10-05 | Discharge: 2013-10-05 | Disposition: A | Discharge status: Home / Self Care | Payer: No Typology Code available for payment source | Source: Ambulatory Visit | Attending: Internal Medicine | Admitting: Internal Medicine

## 2013-10-05 DIAGNOSIS — Z1239 Encounter for other screening for malignant neoplasm of breast: Secondary | ICD-10-CM

## 2013-10-22 ENCOUNTER — Ambulatory Visit: Payer: No Typology Code available for payment source | Attending: Internal Medicine | Admitting: Internal Medicine

## 2013-10-22 ENCOUNTER — Encounter: Payer: Self-pay | Admitting: Internal Medicine

## 2013-10-22 ENCOUNTER — Other Ambulatory Visit (HOSPITAL_COMMUNITY)
Admission: RE | Admit: 2013-10-22 | Discharge: 2013-10-22 | Disposition: A | Discharge status: Home / Self Care | Payer: No Typology Code available for payment source | Source: Ambulatory Visit | Attending: Internal Medicine | Admitting: Internal Medicine

## 2013-10-22 VITALS — BP 116/74 | HR 69 | Temp 97.5°F | Resp 20 | Ht 66.0 in | Wt 302.8 lb

## 2013-10-22 DIAGNOSIS — Z01419 Encounter for gynecological examination (general) (routine) without abnormal findings: Secondary | ICD-10-CM | POA: Insufficient documentation

## 2013-10-22 DIAGNOSIS — L538 Other specified erythematous conditions: Secondary | ICD-10-CM | POA: Insufficient documentation

## 2013-10-22 DIAGNOSIS — Z1272 Encounter for screening for malignant neoplasm of vagina: Secondary | ICD-10-CM | POA: Insufficient documentation

## 2013-10-22 DIAGNOSIS — M25562 Pain in left knee: Secondary | ICD-10-CM

## 2013-10-22 DIAGNOSIS — R739 Hyperglycemia, unspecified: Secondary | ICD-10-CM

## 2013-10-22 DIAGNOSIS — Z113 Encounter for screening for infections with a predominantly sexual mode of transmission: Secondary | ICD-10-CM | POA: Insufficient documentation

## 2013-10-22 DIAGNOSIS — R7309 Other abnormal glucose: Secondary | ICD-10-CM | POA: Insufficient documentation

## 2013-10-22 DIAGNOSIS — Z124 Encounter for screening for malignant neoplasm of cervix: Secondary | ICD-10-CM

## 2013-10-22 DIAGNOSIS — M25569 Pain in unspecified knee: Secondary | ICD-10-CM | POA: Insufficient documentation

## 2013-10-22 DIAGNOSIS — Z87891 Personal history of nicotine dependence: Secondary | ICD-10-CM | POA: Insufficient documentation

## 2013-10-22 DIAGNOSIS — Z1151 Encounter for screening for human papillomavirus (HPV): Secondary | ICD-10-CM | POA: Insufficient documentation

## 2013-10-22 DIAGNOSIS — L304 Erythema intertrigo: Secondary | ICD-10-CM

## 2013-10-22 DIAGNOSIS — N760 Acute vaginitis: Secondary | ICD-10-CM | POA: Insufficient documentation

## 2013-10-22 DIAGNOSIS — M25561 Pain in right knee: Secondary | ICD-10-CM

## 2013-10-22 LAB — POCT CBG (FASTING - GLUCOSE)-MANUAL ENTRY: Glucose Fasting, POC: 111 mg/dL — AB (ref 70–99)

## 2013-10-22 MED ORDER — NYSTATIN 100000 UNIT/GM EX POWD: CUTANEOUS | Status: DC

## 2013-10-22 MED ORDER — DICLOFENAC SODIUM 75 MG PO TBEC: 75.0000 mg | DELAYED_RELEASE_TABLET | Freq: Two times a day (BID) | ORAL | Status: DC

## 2013-10-22 MED ORDER — CLOTRIMAZOLE 1 % EX CREA: 1.0000 "application " | TOPICAL_CREAM | Freq: Two times a day (BID) | CUTANEOUS | Status: DC

## 2013-10-22 NOTE — Progress Notes (Signed)
Pap smear last one was done 3 years ago stated no hx of abnormal pap in the past

## 2013-10-22 NOTE — Progress Notes (Signed)
Patient ID: Amanda Santiago, female   DOB: [birth year], 48 y.o.   MRN: [id number]  CC: GYN exam  HPI:  Last pap 3 years ago and was normal.  She c/o bilateral achy knee pain.  She has lost 16 pounds since last visit, last month.   She has been using the [facility] system to help with weight loss.  No vaginal c/o today.    Allergies  Allergen Reactions  . Alk Sq Cat [Cat Hair Extract]   . Aspirin Swelling  . Shellfish-Derived Products Swelling   Past Medical History  Diagnosis Date  . Arthritis   . Kidney stone 9-10 years ago   Current Outpatient Prescriptions on File Prior to Visit  Medication Sig Dispense Refill  . metFORMIN (GLUCOPHAGE-XR) 500 MG 24 hr tablet Take 1 tablet (500 mg total) by mouth daily with breakfast.  30 tablet  3  . Specialty Vitamins Products (WEIGHT LOSS DAILY MULTI) TABS Take 1 tablet by mouth 4 (four) times daily.       No current facility-administered medications on file prior to visit.   Family History  Problem Relation Age of Onset  . Diabetes Mother   . Hyperlipidemia Mother   . Hypertension Mother    History   Social History  . Marital Status: Married    Spouse Name: N/A    Number of Children: N/A  . Years of Education: N/A   Occupational History  . Not on file.   Social History Main Topics  . Smoking status: Former Smoker -- 1.50 packs/day for 18 years    Types: Cigarettes  . Smokeless tobacco: Former Systems developer    Quit date: 03/12/1997  . Alcohol Use: Not on file  . Drug Use: Not on file  . Sexual Activity: Not on file   Other Topics Concern  . Not on file   Social History Narrative  . No narrative on file    Review of Systems: Constitutional: Negative for fever, chills, diaphoresis, activity change, appetite change and fatigue. HENT: Negative for ear pain, nosebleeds, congestion, facial swelling, rhinorrhea, neck pain, neck stiffness and ear discharge.  Eyes: Negative for pain, discharge, redness, itching and visual  disturbance. Respiratory: Negative for cough, choking, chest tightness, shortness of breath, wheezing and stridor.  Cardiovascular: Negative for chest pain, palpitations and leg swelling. Gastrointestinal: Negative for abdominal distention. Genitourinary: Negative for dysuria, urgency, frequency, hematuria, flank pain, decreased urine volume, difficulty urinating and dyspareunia.  Musculoskeletal: Negative for back pain, joint swelling, and gait problem. + knee pain Neurological: Negative for dizziness, tremors, seizures, syncope, facial asymmetry, speech difficulty, weakness, light-headedness, numbness and headaches.  Hematological: Negative for adenopathy. Does not bruise/bleed easily. Psychiatric/Behavioral: Negative for hallucinations, behavioral problems, confusion, dysphoric mood, decreased concentration and agitation.    Objective:   Filed Vitals:   10/22/13 1147  BP: 116/74  Pulse: 69  Temp: 97.5 F (36.4 C)  Resp: 20    Physical Exam  Constitutional: She is oriented to person, place, and time.  Cardiovascular: Normal rate, regular rhythm and normal heart sounds.   Pulmonary/Chest: Effort normal and breath sounds normal. Right breast exhibits no mass. Left breast exhibits no mass.    Intertrigo   Abdominal: Soft. Bowel sounds are normal.  Genitourinary: Vagina normal and uterus normal. No vaginal discharge found.  Musculoskeletal: Normal range of motion.  Neurological: She is alert and oriented to person, place, and time.     Lab Results  Component Value Date   WBC 7.3 09/14/2013  HGB 14.6 09/14/2013   HCT 43.3 09/14/2013   MCV 87.5 09/14/2013   PLT 235 09/14/2013   Lab Results  Component Value Date   CREATININE 0.41* 09/14/2013   BUN 11 09/14/2013   NA 139 09/14/2013   K 4.1 09/14/2013   CL 102 09/14/2013   CO2 26 09/14/2013    Lab Results  Component Value Date   HGBA1C 6.3* 09/14/2013   Lipid Panel     Component Value Date/Time   CHOL 88 09/14/2013 1012   TRIG 116  09/14/2013 1012   HDL 41 09/14/2013 1012   CHOLHDL 2.1 09/14/2013 1012   VLDL 23 09/14/2013 1012   LDLCALC 24 09/14/2013 1012       Assessment and plan:   Amanda Santiago was seen today for gynecologic exam and medication refill.  Diagnoses and associated orders for this visit:  Papanicolaou smear - Cytology - PAP [place] - Cervicovaginal ancillary only  Arthralgia of both knees - diclofenac (VOLTAREN) 75 MG EC tablet; Take 1 tablet (75 mg total) by mouth 2 (two) times daily.  Intertrigo - nystatin (MYCOSTATIN/NYSTOP) 100000 UNIT/GM POWD; Use twice daily in skin folds  Hyperglycemia - Glucose (CBG), Fasting Continue metformin and weight loss   Return in about 3 months (around 01/22/2014) for prediabetes.        Chari Manning, NP-C [facility] and [facility] [phone] 10/26/2013, 9:26 AM

## 2013-10-22 NOTE — Patient Instructions (Signed)
Ejercicios para perder peso (Exercise to Lose Weight) La actividad fsica y una dieta saludable ayudan a perder peso. El mdico podr sugerirle ejercicios especficos. IDEAS Y CONSEJOS PARA HACER EJERCICIOS  Elija opciones econmicas que disfrute hacer , como caminar, andar en bicicleta o los vdeos para ejercitarse.  Utilice las escaleras en lugar del ascensor.  Camine durante la hora del almuerzo.  Estacione el auto lejos del lugar de trabajo o estudio.  Concurra a un gimnasio o tome clases de gimnasia.  Comience con 5  10 minutos de actividad fsica por da. Ejercite hasta 30 minutos, 4 a 6 das por semana.  Utilice zapatos que tengan un buen soporte y ropas cmodas.  Elongue antes y despus de ejercitar.  Ejercite hasta que aumente la respiracin y el corazn palpite rpido.  Beba agua extra cuando ejercite.  No haga ejercicio hasta lastimarse, sentirse mareado o que le falte mucho el aire. La actividad fsica puede quemar alrededor de 150 caloras.  Correr 20 cuadras en 15 minutos.  Jugar vley durante 45 a 60 minutos.  Limpiar y encerar el auto durante 45 a 60 minutos.  Jugar ftbol americano de toque.  Caminar 25 cuadras en 35 minutos.  Empujar un cochecito 20 cuadras en 30 minutos.  Jugar baloncesto durante 30 minutos.  Rastrillar hojas secas durante 30 minutos.  Andar en bicicleta 80 cuadras en 30 minutos.  Caminar 30 cuadras en 30 minutos.  Bailar durante 30 minutos.  Quitar la nieve con una pala durante 15 minutos.  Nadar vigorosamente durante 20 minutos.  Subir escaleras durante 15 minutos.  Andar en bicicleta 60 cuadras durante 15 minutos.  Arreglar el jardn entre 30 y 45 minutos.  Saltar a la soga durante 15 minutos.  Limpiar vidrios o pisos durante 45 a 60 minutos. Document Released: 06/02/2010 Document Revised: 05/21/2011 [employer] Patient Information 2015 [employer]. This information is not intended to replace advice given to you  by your health care provider. Make sure you discuss any questions you have with your health care provider.  

## 2013-10-23 LAB — CYTOLOGY - PAP

## 2013-10-28 ENCOUNTER — Telehealth: Payer: Self-pay | Admitting: Internal Medicine

## 2013-10-28 NOTE — Telephone Encounter (Signed)
Pt. Calling to get results of Pap smear. Please f/u with pt.

## 2013-10-29 NOTE — Telephone Encounter (Signed)
Patient notified via [facility] that pap was negative for malignancies and infection. Patient instructed to repeat pap in 3 years per provider.

## 2013-10-29 NOTE — Telephone Encounter (Signed)
Message copied by Velora Heckler on Thu Oct 29, 2013 11:32 AM ------      Message from: Chari Manning A      Created: Tue Oct 27, 2013 11:09 PM       Pap negative for malignancies and infections ------

## 2013-11-05 ENCOUNTER — Ambulatory Visit: Payer: No Typology Code available for payment source | Attending: Internal Medicine

## 2013-11-12 ENCOUNTER — Ambulatory Visit: Payer: No Typology Code available for payment source | Attending: Internal Medicine

## 2013-12-23 ENCOUNTER — Encounter: Payer: Self-pay | Admitting: Internal Medicine

## 2013-12-23 ENCOUNTER — Ambulatory Visit: Payer: Self-pay | Attending: Internal Medicine | Admitting: Internal Medicine

## 2013-12-23 ENCOUNTER — Encounter (HOSPITAL_COMMUNITY): Payer: Self-pay | Admitting: Emergency Medicine

## 2013-12-23 ENCOUNTER — Emergency Department (HOSPITAL_COMMUNITY)
Admission: EM | Admit: 2013-12-23 | Discharge: 2013-12-23 | Disposition: A | Discharge status: Home / Self Care | Payer: Self-pay | Attending: Emergency Medicine | Admitting: Emergency Medicine

## 2013-12-23 VITALS — BP 124/80 | HR 62 | Temp 98.5°F | Resp 16 | Ht 64.0 in | Wt 291.0 lb

## 2013-12-23 DIAGNOSIS — T360X5A Adverse effect of penicillins, initial encounter: Secondary | ICD-10-CM | POA: Insufficient documentation

## 2013-12-23 DIAGNOSIS — T50905A Adverse effect of unspecified drugs, medicaments and biological substances, initial encounter: Secondary | ICD-10-CM

## 2013-12-23 DIAGNOSIS — Z87442 Personal history of urinary calculi: Secondary | ICD-10-CM | POA: Insufficient documentation

## 2013-12-23 DIAGNOSIS — Y9289 Other specified places as the place of occurrence of the external cause: Secondary | ICD-10-CM | POA: Insufficient documentation

## 2013-12-23 DIAGNOSIS — Z87891 Personal history of nicotine dependence: Secondary | ICD-10-CM | POA: Insufficient documentation

## 2013-12-23 DIAGNOSIS — Z792 Long term (current) use of antibiotics: Secondary | ICD-10-CM | POA: Insufficient documentation

## 2013-12-23 DIAGNOSIS — M13862 Other specified arthritis, left knee: Secondary | ICD-10-CM | POA: Insufficient documentation

## 2013-12-23 DIAGNOSIS — Z23 Encounter for immunization: Secondary | ICD-10-CM | POA: Insufficient documentation

## 2013-12-23 DIAGNOSIS — R202 Paresthesia of skin: Secondary | ICD-10-CM | POA: Insufficient documentation

## 2013-12-23 DIAGNOSIS — M1990 Unspecified osteoarthritis, unspecified site: Secondary | ICD-10-CM | POA: Insufficient documentation

## 2013-12-23 DIAGNOSIS — M13861 Other specified arthritis, right knee: Secondary | ICD-10-CM | POA: Insufficient documentation

## 2013-12-23 DIAGNOSIS — R7309 Other abnormal glucose: Secondary | ICD-10-CM | POA: Insufficient documentation

## 2013-12-23 DIAGNOSIS — Z3202 Encounter for pregnancy test, result negative: Secondary | ICD-10-CM | POA: Insufficient documentation

## 2013-12-23 DIAGNOSIS — K047 Periapical abscess without sinus: Secondary | ICD-10-CM | POA: Insufficient documentation

## 2013-12-23 DIAGNOSIS — T404X5A Adverse effect of other synthetic narcotics, initial encounter: Secondary | ICD-10-CM | POA: Insufficient documentation

## 2013-12-23 DIAGNOSIS — R110 Nausea: Secondary | ICD-10-CM | POA: Insufficient documentation

## 2013-12-23 DIAGNOSIS — M129 Arthropathy, unspecified: Secondary | ICD-10-CM

## 2013-12-23 DIAGNOSIS — Z79899 Other long term (current) drug therapy: Secondary | ICD-10-CM | POA: Insufficient documentation

## 2013-12-23 DIAGNOSIS — Y939 Activity, unspecified: Secondary | ICD-10-CM | POA: Insufficient documentation

## 2013-12-23 DIAGNOSIS — R7303 Prediabetes: Secondary | ICD-10-CM

## 2013-12-23 DIAGNOSIS — Z791 Long term (current) use of non-steroidal anti-inflammatories (NSAID): Secondary | ICD-10-CM | POA: Insufficient documentation

## 2013-12-23 DIAGNOSIS — M170 Bilateral primary osteoarthritis of knee: Secondary | ICD-10-CM

## 2013-12-23 LAB — CBC WITH DIFFERENTIAL/PLATELET
BASOS PCT: 0 % (ref 0–1)
Basophils Absolute: 0 10*3/uL (ref 0.0–0.1)
EOS ABS: 0.1 10*3/uL (ref 0.0–0.7)
Eosinophils Relative: 1 % (ref 0–5)
HCT: 44.1 % (ref 36.0–46.0)
Hemoglobin: 15 g/dL (ref 12.0–15.0)
LYMPHS ABS: 4.2 10*3/uL — AB (ref 0.7–4.0)
Lymphocytes Relative: 36 % (ref 12–46)
MCH: 29.9 pg (ref 26.0–34.0)
MCHC: 34 g/dL (ref 30.0–36.0)
MCV: 87.8 fL (ref 78.0–100.0)
MONOS PCT: 6 % (ref 3–12)
Monocytes Absolute: 0.7 10*3/uL (ref 0.1–1.0)
NEUTROS ABS: 6.7 10*3/uL (ref 1.7–7.7)
NEUTROS PCT: 57 % (ref 43–77)
PLATELETS: 252 10*3/uL (ref 150–400)
RBC: 5.02 MIL/uL (ref 3.87–5.11)
RDW: 14.9 % (ref 11.5–15.5)
WBC: 11.7 10*3/uL — ABNORMAL HIGH (ref 4.0–10.5)

## 2013-12-23 LAB — COMPREHENSIVE METABOLIC PANEL
ALK PHOS: 117 U/L (ref 39–117)
ALT: 23 U/L (ref 0–35)
ANION GAP: 14 (ref 5–15)
AST: 25 U/L (ref 0–37)
Albumin: 3.5 g/dL (ref 3.5–5.2)
BUN: 17 mg/dL (ref 6–23)
CHLORIDE: 99 meq/L (ref 96–112)
CO2: 25 mEq/L (ref 19–32)
Calcium: 9.2 mg/dL (ref 8.4–10.5)
Creatinine, Ser: 0.62 mg/dL (ref 0.50–1.10)
GFR calc Af Amer: >90 mL/min (ref >90)
GFR calc non Af Amer: >90 mL/min (ref >90)
Glucose, Bld: 103 mg/dL — ABNORMAL HIGH (ref 70–99)
Potassium: 4.1 mEq/L (ref 3.7–5.3)
SODIUM: 138 meq/L (ref 137–147)
Total Protein: 8.6 g/dL — ABNORMAL HIGH (ref 6.0–8.3)

## 2013-12-23 LAB — URINALYSIS, ROUTINE W REFLEX MICROSCOPIC
Bilirubin Urine: NEGATIVE
GLUCOSE, UA: NEGATIVE mg/dL
HGB URINE DIPSTICK: NEGATIVE
Ketones, ur: NEGATIVE mg/dL
LEUKOCYTES UA: NEGATIVE
Nitrite: NEGATIVE
Protein, ur: NEGATIVE mg/dL
SPECIFIC GRAVITY, URINE: 1.023 (ref 1.005–1.030)
Urobilinogen, UA: 0.2 mg/dL (ref 0.0–1.0)
pH: 7.5 (ref 5.0–8.0)

## 2013-12-23 LAB — POCT GLYCOSYLATED HEMOGLOBIN (HGB A1C): Hemoglobin A1C: 5.9

## 2013-12-23 LAB — GLUCOSE, POCT (MANUAL RESULT ENTRY): POC GLUCOSE: 106 mg/dL — AB (ref 70–99)

## 2013-12-23 LAB — PREGNANCY, URINE: Preg Test, Ur: NEGATIVE

## 2013-12-23 LAB — LIPASE, BLOOD: Lipase: 39 U/L (ref 11–59)

## 2013-12-23 MED ORDER — METFORMIN HCL ER 500 MG PO TB24: 500.0000 mg | ORAL_TABLET | Freq: Every day | ORAL | Status: DC

## 2013-12-23 MED ORDER — NAPROXEN 500 MG PO TABS: 500.0000 mg | ORAL_TABLET | Freq: Two times a day (BID) | ORAL | Status: DC

## 2013-12-23 MED ORDER — TRAMADOL HCL 50 MG PO TABS: 50.0000 mg | ORAL_TABLET | Freq: Three times a day (TID) | ORAL | Status: DC | PRN

## 2013-12-23 MED ORDER — LACTINEX PO CHEW: 1.0000 | CHEWABLE_TABLET | Freq: Three times a day (TID) | ORAL | Status: DC

## 2013-12-23 MED ORDER — ONDANSETRON HCL 4 MG PO TABS: 4.0000 mg | ORAL_TABLET | Freq: Three times a day (TID) | ORAL | Status: DC | PRN

## 2013-12-23 MED ORDER — AMOXICILLIN-POT CLAVULANATE 875-125 MG PO TABS: 1.0000 | ORAL_TABLET | Freq: Two times a day (BID) | ORAL | Status: DC

## 2013-12-23 NOTE — Progress Notes (Signed)
Patient ID: Amanda Santiago, female   DOB: 06/21/1965, 48 y.o.   MRN: [id number]  CC: dental pain  HPI:  Patient presents to clinic today for mouth pain.  She states that she had her lower right molar removed on Monday by a hispanic dentist in [place].  She states that the tooth was infected and it was completed as a emergency extraction.  She feels as if she has swelling in her mouth and her face is sore under her right eye.  She reports that she has taken two days worth of antibiotics from [place] that is similar to amoxicillin.   She has lost 27 pounds in 3 months and is still taking dietary supplements called "Yes I can".  She states that she has continued to have severe pain in her bilateral knees. She has noticed some improvement since weight loss.  She was told by a provider that she has chronic arthritis in her knees.    Allergies  Allergen Reactions  . Alk Sq Cat [Cat Hair Extract]   . Aspirin Swelling  . Shellfish-Derived Products Swelling   Past Medical History  Diagnosis Date  . Arthritis   . Kidney stone 9-10 years ago   Current Outpatient Prescriptions on File Prior to Visit  Medication Sig Dispense Refill  . metFORMIN (GLUCOPHAGE-XR) 500 MG 24 hr tablet Take 1 tablet (500 mg total) by mouth daily with breakfast.  30 tablet  3  . clotrimazole (LOTRIMIN) 1 % cream Apply 1 application topically 2 (two) times daily.  30 g  0  . diclofenac (VOLTAREN) 75 MG EC tablet Take 1 tablet (75 mg total) by mouth 2 (two) times daily.  60 tablet  1  . nystatin (MYCOSTATIN/NYSTOP) 100000 UNIT/GM POWD Use twice daily in skin folds  30 g  2  . Specialty Vitamins Products (WEIGHT LOSS DAILY MULTI) TABS Take 1 tablet by mouth 4 (four) times daily.       No current facility-administered medications on file prior to visit.   Family History  Problem Relation Age of Onset  . Diabetes Mother   . Hyperlipidemia Mother   . Hypertension Mother    History   Social History  . Marital Status:  Married    Spouse Name: N/A    Number of Children: N/A  . Years of Education: N/A   Occupational History  . Not on file.   Social History Main Topics  . Smoking status: Former Smoker -- 1.50 packs/day for 18 years    Types: Cigarettes  . Smokeless tobacco: Former Systems developer    Quit date: 03/12/1997  . Alcohol Use: Not on file  . Drug Use: Not on file  . Sexual Activity: Not on file   Other Topics Concern  . Not on file   Social History Narrative  . No narrative on file    Review of Systems: See HPI   Objective:   Filed Vitals:   12/23/13 0927  BP: 124/80  Pulse: 62  Temp: 98.5 F (36.9 C)  Resp: 16    Physical Exam  Constitutional: She is oriented to person, place, and time.  HENT:  Right Ear: External ear normal.  Left Ear: External ear normal.  Right sided facial tenderness  Cardiovascular: Normal rate, regular rhythm and normal heart sounds.   Pulmonary/Chest: Effort normal and breath sounds normal.  Abdominal: Soft. Bowel sounds are normal.  Musculoskeletal: Normal range of motion. She exhibits no edema and no tenderness.  Neurological: She is alert and oriented  to person, place, and time.     Lab Results  Component Value Date   WBC 7.3 09/14/2013   HGB 14.6 09/14/2013   HCT 43.3 09/14/2013   MCV 87.5 09/14/2013   PLT 235 09/14/2013   Lab Results  Component Value Date   CREATININE 0.41* 09/14/2013   BUN 11 09/14/2013   NA 139 09/14/2013   K 4.1 09/14/2013   CL 102 09/14/2013   CO2 26 09/14/2013    Lab Results  Component Value Date   HGBA1C 5.9 12/23/2013   Lipid Panel     Component Value Date/Time   CHOL 88 09/14/2013 1012   TRIG 116 09/14/2013 1012   HDL 41 09/14/2013 1012   CHOLHDL 2.1 09/14/2013 1012   VLDL 23 09/14/2013 1012   LDLCALC 24 09/14/2013 1012       Assessment and plan:   Blanca was seen today for follow-up.  Diagnoses and associated orders for this visit:  Prediabetes - Glucose (CBG) - HgB A1c - Continue metFORMIN (GLUCOPHAGE-XR) 500 MG 24  hr tablet; Take 1 tablet (500 mg total) by mouth daily with breakfast.  Dental infection - amoxicillin-clavulanate (AUGMENTIN) 875-125 MG per tablet; Take 1 tablet by mouth 2 (two) times daily. - traMADol (ULTRAM) 50 MG tablet; Take 1 tablet (50 mg total) by mouth every 8 (eight) hours as needed.  Arthritis of both knees - Ambulatory referral to Sports Medicine  Need for influenza vaccination received  Return if symptoms worsen or fail to improve.        Chari Manning, NP-C [facility] [phone] 12/23/2013, 9:59 AM

## 2013-12-23 NOTE — ED Notes (Signed)
Attempted to draw blood x2 with no success. Notified Phlebotomy

## 2013-12-23 NOTE — ED Provider Notes (Signed)
CSN: [id number]     Arrival date & time 12/23/13  1853 History   First MD Initiated Contact with Patient 12/23/13 2004     Chief Complaint  Patient presents with  . Abdominal Pain     (Consider location/radiation/quality/duration/timing/severity/associated sxs/prior Treatment) HPI  Amanda Santiago is a(n) 48 y.o. female who presents to the ED with CC of nausea, tingling over her whole body , and abnormal sensation to her vagina which she describes as tingling. The symptoms began yesterday after taking her first dose of tramadol and Augmentin which she was prescribed from her PCP after undergoing a dental procedure. She denies wheezing, swelling of lips/tongue/ throat, urticaria or other sxs of alllergic rxn. She denies vomiting, abdominal pain, diarrhea or constipation. She denies any other vaginal sxs She states that she feels woozy after taking the pain medication.  Denies fevers, chills, myalgias, arthralgias. Denies DOE, SOB, chest tightness or pressure, radiation to left arm, jaw or back, or diaphoresis. Denies dysuria, flank pain, suprapubic pain, frequency, urgency, or hematuria. Denies headaches,weakness, visual disturbances.   Past Medical History  Diagnosis Date  . Arthritis   . Kidney stone 9-10 years ago   Past Surgical History  Procedure Laterality Date  . Cesarean section     Family History  Problem Relation Age of Onset  . Diabetes Mother   . Hyperlipidemia Mother   . Hypertension Mother    History  Substance Use Topics  . Smoking status: Former Smoker -- 1.50 packs/day for 18 years    Types: Cigarettes  . Smokeless tobacco: Former Neurosurgeon    Quit date: 03/12/1997  . Alcohol Use: Not on file   OB History   Grav Para Term Preterm Abortions TAB SAB Ect Mult Living                 Review of Systems Ten systems reviewed and are negative for acute change, except as noted in the HPI.      Allergies  Alk sq cat; Aspirin; and Shellfish-derived  products  Home Medications   Prior to Admission medications   Medication Sig Start Date End Date Taking? Authorizing Provider  amoxicillin-clavulanate (AUGMENTIN) 875-125 MG per tablet Take 1 tablet by mouth 2 (two) times daily. 12/23/13   Ambrose Finland, NP  clotrimazole (LOTRIMIN) 1 % cream Apply 1 application topically 2 (two) times daily. 10/22/13   Ambrose Finland, NP  diclofenac (VOLTAREN) 75 MG EC tablet Take 1 tablet (75 mg total) by mouth 2 (two) times daily. 10/22/13   Ambrose Finland, NP  metFORMIN (GLUCOPHAGE-XR) 500 MG 24 hr tablet Take 1 tablet (500 mg total) by mouth daily with breakfast. 12/23/13   Ambrose Finland, NP  nystatin (MYCOSTATIN/NYSTOP) 100000 UNIT/GM POWD Use twice daily in skin folds 10/22/13   Ambrose Finland, NP  Specialty Vitamins Products (WEIGHT LOSS DAILY MULTI) TABS Take 1 tablet by mouth 4 (four) times daily. 08/13/13   Historical Provider, MD  traMADol (ULTRAM) 50 MG tablet Take 1 tablet (50 mg total) by mouth every 8 (eight) hours as needed. 12/23/13   Ambrose Finland, NP   BP 133/84  Pulse 69  Temp(Src) 98.3 F (36.8 C) (Oral)  Resp 18  Ht 5\' 4"  (1.626 m)  Wt 288 lb (130.636 kg)  BMI 49.41 kg/m2  SpO2 94% Physical Exam Physical Exam  Nursing note and vitals reviewed. Constitutional: She is oriented to person, place, and time. She appears well-developed and well-nourished. No distress.  HENT:  Head:  Normocephalic and atraumatic.  Eyes: Conjunctivae normal and EOM are normal. Pupils are equal, round, and reactive to light. No scleral icterus.  Neck: Normal range of motion.  Cardiovascular: Normal rate, regular rhythm and normal heart sounds.  Exam reveals no gallop and no friction rub.   No murmur heard. Pulmonary/Chest: Effort normal and breath sounds normal. No respiratory distress.  Abdominal: Soft. Bowel sounds are normal. She exhibits no distension and no mass. There is no tenderness. There is no guarding.  Neurological: She is alert and oriented to  person, place, and time.  Skin: Skin is warm and dry. She is not diaphoretic.    ED Course  Procedures (including critical care time) Labs Review Labs Reviewed  CBC WITH DIFFERENTIAL - Abnormal; Notable for the following:    WBC 11.7 (*)    Lymphs Abs 4.2 (*)    All other components within normal limits  COMPREHENSIVE METABOLIC PANEL - Abnormal; Notable for the following:    Glucose, Bld 103 (*)    Total Protein 8.6 (*)    Total Bilirubin <0.2 (*)    All other components within normal limits  URINALYSIS, ROUTINE W REFLEX MICROSCOPIC - Abnormal; Notable for the following:    APPearance CLOUDY (*)    All other components within normal limits  LIPASE, BLOOD  PREGNANCY, URINE    Imaging Review No results found.   EKG Interpretation None      MDM   Final diagnoses:  Medication side effects present, initial encounter  Nausea   .  Patient with vague complaints that appear to be side effects of her new mediations. No focal neuro deficits, no abdominal pain , afebrile, labs are unremarkable, and HDS. I doubt any emergent cause of her sxs such as stroke, anaphylaxis, or infection.  I will d/c patient with antiemetic and change in pain medication. I have encouraged her to continue with the course of abx and explained possible side effects as well as reasons to discontinue to medication and to seek emergent medical attention. Transalations services used due to language barrier. The patient appears reasonably screened and/or stabilized for discharge and I doubt any other medical condition or other [facility] requiring further screening, evaluation, or treatment in the ED at this time prior to discharge.    Margarita Mail, PA-C 01/05/14 1034

## 2013-12-23 NOTE — Discharge Instructions (Signed)
Abdominal (belly) pain can be caused by many things. Your caregiver performed an examination and possibly ordered blood/urine tests and imaging (CT scan, x-rays, ultrasound). Many cases can be observed and treated at home after initial evaluation in the emergency department. Even though you are being discharged home, abdominal pain can be unpredictable. Therefore, you need a repeated exam if your pain does not resolve, returns, or worsens. Most patients with abdominal pain don't have to be admitted to the hospital or have surgery, but serious problems like appendicitis and gallbladder attacks can start out as nonspecific pain. Many abdominal conditions cannot be diagnosed in one visit, so follow-up evaluations are very important. SEEK IMMEDIATE MEDICAL ATTENTION IF: The pain does not go away or becomes severe.  A temperature above 101 develops.  Repeated vomiting occurs (multiple episodes).  The pain becomes localized to portions of the abdomen. The right side could possibly be appendicitis. In an adult, the left lower portion of the abdomen could be colitis or diverticulitis.  Blood is being passed in stools or vomit (bright red or black tarry stools).  Return also if you develop chest pain, difficulty breathing, dizziness or fainting, or become confused, poorly responsive, or inconsolable (young children).  Nuseas, Adulto (Nausea, Adult)  La nusea es la sensacin de Tree surgeon en el estmago o de la necesidad de vomitar. No constituye una preocupacin seria en s misma, pero puede ser un signo de problemas mdicos ms graves. Si empeora, puede provocar vmitos. Si aparecen vmitos, hay riesgo de deshidratacin.  CAUSES   Infecciones por virus.  Intoxicacin alimentaria.  Medicamentos.  Embarazo.  Mareos por movimiento.  Cefaleas migraosas.  Estrs emocional.  Dolor intenso producido en Clinical cytogeneticist.  Intoxicacin por alcohol. INSTRUCCIONES PARA EL CUIDADO EN EL HOGAR   Debe  hacer reposo.  Pida instrucciones especficas a su mdico con respecto a la rehidratacin.  Consuma cantidades pequeas de alimentos y tome sorbos de lquidos con ms frecuencia.  [employer] como le indic el mdico. SOLICITE ATENCIN MDICA SI:   No mejora, o [place], despus de 2 das de tratamiento.  Tiene cefalea. SOLICITE ATENCIN MDICA DE INMEDIATO SI:  Tiene fiebre.  Se desmaya.  Sigue vomitando u observa sangre en el vmito.  Se siente extremadamente dbil o deshidratado.  La materia fecal es negra o tiene [place].  Siente dolor intenso en el pecho o en el abdomen. ASEGRESE DE QUE:   Comprende estas instrucciones.  Controlar su enfermedad.  Solicitar ayuda de inmediato si no mejora o si empeora. Document Released: 02/26/2005 Document Revised: 11/21/2011 [facility] Patient Information 2015 [place]. This information is not intended to replace advice given to you by your health care provider. Make sure you discuss any questions you have with your health care provider.    Antibiotic Medication Antibiotics are among the most frequently prescribed medicines. Antibiotics cure illness by assisting our body to injure or kill the bacteria that cause infection. While antibiotics are useful to treat a wide variety of infections they are useless against viruses. Antibiotics cannot cure colds, flu, or other viral infections.  There are many types of antibiotics available. Your caregiver will decide which antibiotic will be useful for an illness. Never take or give someone else's antibiotics or left over medicine. Your caregiver may also take into account:  Allergies.  The cost of the medicine.  Dosing schedules.  Taste.  Common side effects when choosing an antibiotic for an infection. Ask your caregiver if you have questions about why  a certain medicine was chosen. HOME CARE INSTRUCTIONS Read all instructions and labels on medicine bottles  carefully. Some antibiotics should be taken on an empty stomach while others should be taken with food. Taking antibiotics incorrectly may reduce how well they work. Some antibiotics need to be kept in the refrigerator. Others should be kept at room temperature. Ask your caregiver or pharmacist if you do not understand how to give the medicine. Be sure to give the amount of medicine your caregiver has prescribed. Even if you feel better and your symptoms improve, bacteria may still remain alive in the body. Taking all of the medicine will prevent:  The infection from returning and becoming harder to treat.  Complications from partially treated infections. If there is any medicine left over after you have taken the medicine as your caregiver has instructed, throw the medicine away. Be sure to tell your caregiver if you:  Are allergic to any medicines.  Are pregnant or intend to become pregnant while using this medicine.  Are breastfeeding.  Are taking any other prescription, non-prescription medicine, or herbal remedies.  Have any other medical conditions or problems you have not already discussed. If you are taking birth control pills, they may not work while you are on antibiotics. To avoid unwanted pregnancy:  Continue taking your birth control pills as usual.  Use a second form of birth control (such as condoms) while you are taking antibiotic medicine.  When you finish taking the antibiotic medicine, continue using the second form of birth control until you are finished with your current 1 month cycle of birth control pills. Try not to miss any doses of medicine. If you miss a dose, take it as soon as possible. However, if it is almost time for the next dose and the dosing schedule is:  2 doses a day, take the missed dose and the next dose 5 to 6 hours apart.  3 or more doses a day, take the missed dose and the next dose 2 to 4 hours apart, then go back to the normal schedule.  If  you are unable to make up a missed dose, take the next scheduled dose on time and complete the missed dose at the end of the prescribed time for your medicine. SIDE EFFECTS TO TAKING ANTIBIOTICS Common side effects to antibiotic use include:  Soft stools or diarrhea.  Mild stomach upset.  Sun sensitivity. SEEK MEDICAL CARE IF:   If you get worse or do not improve within a few days of starting the medicine.  Vomiting develops.  Diaper rash or rash on the genitals appears.  Vaginal itching occurs.  White patches appear on the tongue or in the mouth.  Severe watery diarrhea and abdominal cramps occur.  Signs of an allergy develop (hives, unknown itchy rash appears). STOP TAKING THE ANTIBIOTIC. SEEK IMMEDIATE MEDICAL CARE IF:   Urine turns dark or blood colored.  Skin turns yellow.  Easy bruising or bleeding occurs.  Joint pain or muscle aches occur.  Fever returns.  Severe headache occurs.  Signs of an allergy develop (trouble breathing, wheezing, swelling of the lips, face or tongue, fainting, or blisters on the skin or in the mouth). STOP TAKING THE ANTIBIOTIC. Document Released: 11/09/2003 Document Revised: 05/21/2011 Document Reviewed: 11/18/2008 [facility] Patient Information 2015 [place], [place]. This information is not intended to replace advice given to you by your health care provider. Make sure you discuss any questions you have with your health care provider.  Tramadol tablets  What is this medicine? TRAMADOL (TRA ma dole) is a pain reliever. It is used to treat moderate to severe pain in adults. This medicine may be used for other purposes; ask your health care provider or pharmacist if you have questions. COMMON BRAND NAME(S): Ultram What should I tell my health care provider before I take this medicine? They need to know if you have any of these conditions: -brain tumor -depression -drug abuse or addiction -head injury -if you frequently drink alcohol  containing drinks -kidney disease or trouble passing urine -liver disease -lung disease, asthma, or breathing problems -seizures or epilepsy -suicidal thoughts, plans, or attempt; a previous suicide attempt by you or a family member -an unusual or allergic reaction to tramadol, codeine, other medicines, foods, dyes, or preservatives -pregnant or trying to get pregnant -breast-feeding How should I use this medicine? Take this medicine by mouth with a full glass of water. Follow the directions on the prescription label. If the medicine upsets your stomach, take it with food or milk. Do not take more medicine than you are told to take. Talk to your pediatrician regarding the use of this medicine in children. Special care may be needed. Overdosage: If you think you have taken too much of this medicine contact a poison control center or emergency room at once. NOTE: This medicine is only for you. Do not share this medicine with others. What if I miss a dose? If you miss a dose, take it as soon as you can. If it is almost time for your next dose, take only that dose. Do not take double or extra doses. What may interact with this medicine? Do not take this medicine with any of the following medications: -MAOIs like Carbex, Eldepryl, Marplan, Nardil, and Parnate This medicine may also interact with the following medications: -alcohol or medicines that contain alcohol -antihistamines -benzodiazepines -bupropion -carbamazepine or oxcarbazepine -clozapine -cyclobenzaprine -digoxin -furazolidone -linezolid -medicines for depression, anxiety, or psychotic disturbances -medicines for migraine headache like almotriptan, eletriptan, frovatriptan, naratriptan, rizatriptan, sumatriptan, zolmitriptan -medicines for pain like pentazocine, buprenorphine, butorphanol, meperidine, nalbuphine, and propoxyphene -medicines for sleep -muscle relaxants -naltrexone -phenobarbital -phenothiazines like  perphenazine, thioridazine, chlorpromazine, mesoridazine, fluphenazine, prochlorperazine, promazine, and trifluoperazine -procarbazine -warfarin This list may not describe all possible interactions. Give your health care provider a list of all the medicines, herbs, non-prescription drugs, or dietary supplements you use. Also tell them if you smoke, drink alcohol, or use illegal drugs. Some items may interact with your medicine. What should I watch for while using this medicine? Tell your doctor or health care professional if your pain does not go away, if it gets worse, or if you have new or a different type of pain. You may develop tolerance to the medicine. Tolerance means that you will need a higher dose of the medicine for pain relief. Tolerance is normal and is expected if you take this medicine for a long time. Do not suddenly stop taking your medicine because you may develop a severe reaction. Your body becomes used to the medicine. This does NOT mean you are addicted. Addiction is a behavior related to getting and using a drug for a non-medical reason. If you have pain, you have a medical reason to take pain medicine. Your doctor will tell you how much medicine to take. If your doctor wants you to stop the medicine, the dose will be slowly lowered over time to avoid any side effects. You may get drowsy or dizzy. Do not drive, use machinery,  or do anything that needs mental alertness until you know how this medicine affects you. Do not stand or sit up quickly, especially if you are an older patient. This reduces the risk of dizzy or fainting spells. Alcohol can increase or decrease the effects of this medicine. Avoid alcoholic drinks. You may have constipation. Try to have a bowel movement at least every 2 to 3 days. If you do not have a bowel movement for 3 days, call your doctor or health care professional. Your mouth may get dry. Chewing sugarless gum or sucking hard candy, and drinking plenty of  water may help. Contact your doctor if the problem does not go away or is severe. What side effects may I notice from receiving this medicine? Side effects that you should report to your doctor or health care professional as soon as possible: -allergic reactions like skin rash, itching or hives, swelling of the face, lips, or tongue -breathing difficulties, wheezing -confusion -itching -light headedness or fainting spells -redness, blistering, peeling or loosening of the skin, including inside the mouth -seizures Side effects that usually do not require medical attention (report to your doctor or health care professional if they continue or are bothersome): -constipation -dizziness -drowsiness -headache -nausea, vomiting This list may not describe all possible side effects. Call your doctor for medical advice about side effects. You may report side effects to FDA at 1-800-FDA-1088. Where should I keep my medicine? Keep out of the reach of children. Store at room temperature between 15 and 30 degrees C (59 and 86 degrees F). Keep container tightly closed. Throw away any unused medicine after the expiration date. NOTE: This sheet is a summary. It may not cover all possible information. If you have questions about this medicine, talk to your doctor, pharmacist, or health care provider.  2015, Elsevier/[employer] Standard. (2009-11-09 11:55:44)

## 2013-12-23 NOTE — ED Notes (Signed)
Pt. reports mid abdominal pain with nausea , lips/feet tingling onset this evening , denies diarrhea , no fever or chills.

## 2013-12-23 NOTE — ED Notes (Signed)
Pt went to dr for antibiotics for tooth pain today at 1730 and after taking augmentin and tramadol she started feeling nauseated, pain in abdomin, tingling in mouth feet and vaginal area.  Denies itching or redness.  Pt also stated she had the flu vaccine today and wonders if she is having a reaction to it.

## 2013-12-23 NOTE — Progress Notes (Signed)
Pt is here following up on her prediabetes. Pt states that she had a molar removed and now she is in pain and she said that there is puss. Her neck is swollen and throbbing. Pt is requesting a specialist to look at her knees.

## 2013-12-23 NOTE — Patient Instructions (Signed)
Absceso dental  (Dental Abscess)  Un absceso dental es la acumulacin de lquido infectado (pus) debido a una infeccin bacteriana en la parte interna del diente (pulpa). Generalmente se produce en la punta de la raz del diente.  CAUSAS   Caries dentales graves.  Traumatismo dental que permite el ingreso de bacterias en la pulpa, como en el caso de un diente roto o [place]. SNTOMAS   Dolor intenso en el interior y alrededor del diente infectado.  Hinchazn y enrojecimiento alrededor del diente, en la boca o en la cara.  Sensibilidad.  Secrecin de pus.  Mal aliento.  Gusto desagradable en la boca.  Dificultad para tragar.  Dificultad para abrir Equities trader.  Nuseas.  Vmitos.  Escalofros.  Ganglios hinchados en el cuello. DIAGNSTICO   Deben tomarle una historia clnica y dental.  El examen consistir en punzar el absceso del diente.  Le tomarn radiografas del diente para identificar el absceso. TRATAMIENTO  El objetivo del tratamiento es eliminar la infeccin. Le indicarn antibiticos para evitar que la infeccin se extienda. Para salvar el diente, el dentista podra realizarle un tratamiento de conducto. Si el diente no puede salvarse, habr que sacarlo (extraerlo) y se drenar el absceso.  INSTRUCCIONES PARA EL CUIDADO EN EL HOGAR   Slo tome medicamentos de venta libre o recetados para [employer], la fiebre, o el [place], segn las indicaciones de su mdico.  Enjuguese la boca con frecuencia (haga buches) con agua y sal ( de cucharadita de sal en 240 ml. de agua tibia) para aliviar el dolor y la inflamacin.  No conduzca mientras toma analgsicos (narcticos).  No aplique calor en la parte externa del rostro.  Regrese para completar el tratamiento con el dentista, segn las indicaciones. SOLICITE ATENCIN MDICA SI:   El dolor no se alivia con los [employer].  El dolor empeora en vez de [place]. SOLICITE ATENCIN MDICA DE INMEDIATO SI:    Tiene fiebre o sntomas persistentes durante ms de 2  3 das.  Tiene fiebre y los sntomas empeoran.  Siente escalofros o un dolor de cabeza muy intenso.  Tiene problemas para respirar o tragar.  Tiene dificultad para abrir Equities trader.  Observa hinchazn en el cuello o alrededor del ojo. Document Released: 02/26/2005 Document Revised: 08/28/2011 [facility] Patient Information 2015 [place], [place]. This information is not intended to replace advice given to you by your health care provider. Make sure you discuss any questions you have with your health care provider.

## 2014-01-07 ENCOUNTER — Ambulatory Visit: Payer: Self-pay | Admitting: Sports Medicine

## 2014-01-07 NOTE — ED Provider Notes (Signed)
Medical screening examination/treatment/procedure(s) were performed by non-physician practitioner and as supervising physician I was immediately available for consultation/collaboration.   EKG Interpretation None        Sharyon Cable, MD 01/07/14 1601

## 2014-01-15 ENCOUNTER — Encounter: Payer: Self-pay | Admitting: Sports Medicine

## 2014-01-15 ENCOUNTER — Ambulatory Visit (INDEPENDENT_AMBULATORY_CARE_PROVIDER_SITE_OTHER): Payer: Self-pay | Admitting: Sports Medicine

## 2014-01-15 VITALS — BP 150/95 | HR 62 | Ht 64.0 in | Wt 291.0 lb

## 2014-01-15 DIAGNOSIS — M25561 Pain in right knee: Secondary | ICD-10-CM | POA: Insufficient documentation

## 2014-01-15 DIAGNOSIS — M25562 Pain in left knee: Secondary | ICD-10-CM

## 2014-01-15 MED ORDER — METHYLPREDNISOLONE ACETATE 40 MG/ML IJ SUSP
40.0000 mg | Freq: Once | INTRAMUSCULAR | Status: AC
  Administered 2014-01-15: 40 mg via INTRA_ARTICULAR

## 2014-01-15 NOTE — Patient Instructions (Signed)
Interpreter, Windle Guard

## 2014-01-15 NOTE — Progress Notes (Signed)
Amanda Santiago - 48 y.o. female MRN [id number]  Date of birth: Dec 22, 1965  CC & HPI:  Patient presents as new patient for evaluation of: Bilateral knee pain: patient reports long-standing history of bilateral knee pain. She denies any significant trauma to her knees in the past but has previously had injections for her right knee. She's been told she has arthritis and is here today for further evaluation. She reports significant difficulty with walking, pain with prolonged standing or prolonged sitting. She denies any significant locking or giving way. Her knee pain is diffuse in bilateral but seems to be worse over the medial aspect bilaterally. Right leg is worse than left.  She reports significant 50-60 pound weight loss associated with increased activity and swimming. She is interested in resuming swimming and is looking into joining the [facility].   ROS:  Per HPI.   HISTORY: Past Medical, Surgical, Social, and Family History Reviewed & Updated per EMR.  Pertinent Historical Findings include: Prediabetes, arthritis, dental infections. Obesity Prior C-section, no orthopedic surgeries Non smoker currently quit 1999. 27 pack yr hx  Historical Data Reviewed: Prior x-ray of right knee from 2009 shows tricompartmental disease.  OBJECTIVE:  VS:   HT:5\' 4"  (162.6 cm)   WT:291 lb (131.997 kg)  BMI:50.1          BP:(!) 150/95 mmHg  HR:62bpm  TEMP: ( )  RESP:   PHYSICAL EXAM: GENERAL: Adult morbidly obese Hispanic  female. In no discomfort; no respiratory distress . In person interpreter from [facility] use today for the entirety of the exam  PSYCH: alert and appropriate, good insight   NEURO: Sensation is intact to light touch in bilateral lower extremities  VASCULAR: DP pulses 2+/4.  No significant edema.    BILATERAL KNEE EXAM: Appearance: Large knee with chronic osteoarthritic bossing. No significant effusion appreciated but exam limited due to body habitus.  Skin: No overlying  erythema/ecchymosis.  Palpation: TTP over: medial joint line  No TTP over: patellar tendon  Strength & ROM: 0-90 of knee flexion. Extensor mechanism strength intact, poor VMO definition  Special Tests: Knee is stable to varus and valgus strain, anterior posterior drawer. Negative McMurray's.   ASSESSMENT: 1. Knee pain, bilateral   - Osteoarthrtic changes on X-ray from 2009.  Suspect bilateral worsening given significant wt gain since that time  PROCEDURE NOTE : Left Knee Injection After discussing the risks, benefits and expected outcomes of the injection and all questions were reviewed and answered,  she wished to undergo the above named procedure.  Written consent was obtained. After an appropriate time out was taken the Left knee was sterilely prepped and injected as below: Prep:    Betadine and alcohol,  Ethel chloride.  Approach:  anteriomedial Needle:  22g 1.5 inch Meds:   3cc 1% lido; 1cc 40mg  depomedrol A bandaid was applied to the area. This procedure was well tolerated and there were no complications.    PROCEDURE NOTE : Right Knee Injection After discussing the risks, benefits and expected outcomes of the injection and all questions were reviewed and answered,  she wished to undergo the above named procedure.  Written consent was obtained. After an appropriate time out was taken the right knee was sterilely prepped and injected as below: Prep:    Betadine and alcohol,  Ethel chloride.  Approach:  anteriomedial Needle:  22g 1.5 inch Meds:   3cc 1% lido; 1cc 40mg  depomedrol A bandaid was applied to the area. This procedure was well tolerated and there  were no complications.    PLAN: See problem based charting & AVS for additional documentation. - Bilateral knee injections today - recommended resuming aerobic exercise and encouraged to join [employer] to continue swimming given significant wt loss over the summer - recommended bilateral straight leg raises - Will follow-up when  necessary, consider viscous supplementation. Will need repeat standing x-rays if worsening or recurrent within the next 6 months. > Return if symptoms worsen or fail to improve.

## 2014-02-16 ENCOUNTER — Other Ambulatory Visit: Payer: Self-pay | Admitting: Internal Medicine

## 2014-03-04 ENCOUNTER — Ambulatory Visit: Payer: Self-pay | Admitting: Internal Medicine

## 2014-03-08 ENCOUNTER — Encounter: Payer: Self-pay | Admitting: Internal Medicine

## 2014-03-08 ENCOUNTER — Ambulatory Visit: Payer: Self-pay | Attending: Internal Medicine | Admitting: Internal Medicine

## 2014-03-08 VITALS — BP 121/80 | HR 71 | Temp 97.8°F | Resp 14 | Ht 64.0 in | Wt 296.0 lb

## 2014-03-08 DIAGNOSIS — Z87891 Personal history of nicotine dependence: Secondary | ICD-10-CM | POA: Insufficient documentation

## 2014-03-08 DIAGNOSIS — K088 Other specified disorders of teeth and supporting structures: Secondary | ICD-10-CM | POA: Insufficient documentation

## 2014-03-08 DIAGNOSIS — R1900 Intra-abdominal and pelvic swelling, mass and lump, unspecified site: Secondary | ICD-10-CM | POA: Insufficient documentation

## 2014-03-08 DIAGNOSIS — K760 Fatty (change of) liver, not elsewhere classified: Secondary | ICD-10-CM | POA: Insufficient documentation

## 2014-03-08 DIAGNOSIS — Z98811 Dental restoration status: Secondary | ICD-10-CM

## 2014-03-08 DIAGNOSIS — M25562 Pain in left knee: Secondary | ICD-10-CM | POA: Insufficient documentation

## 2014-03-08 LAB — POCT URINALYSIS DIPSTICK
Bilirubin, UA: NEGATIVE
GLUCOSE UA: NEGATIVE
KETONES UA: NEGATIVE
Leukocytes, UA: NEGATIVE
Nitrite, UA: NEGATIVE
Protein, UA: NEGATIVE
SPEC GRAV UA: 1.015
UROBILINOGEN UA: 1
pH, UA: 7.5

## 2014-03-08 MED ORDER — METFORMIN HCL ER 500 MG PO TB24: ORAL_TABLET | ORAL | Status: DC

## 2014-03-08 NOTE — Patient Instructions (Signed)
Hgado graso (Fatty Liver) El hgado graso es la acumulacin de grasa en las clulas del hgado. Tambin se llama hepatoesteatosis o esteatohepatitis. Es normal que el hgado contenga algo de [place]. Si la grasa es de ms del 5% al 10% del peso del hgado, la persona tiene hgado graso.  A menudo, esta afeccin no presenta sntomas (problemas) durante aos, pero el dao va avanzando. Las personas suelen enterarse de que tienen hgado graso cuando se hacen estudios por otras razones. La grasa puede daar el hgado durante aos o incluso dcadas sin causar problemas. Cuando se convierte en algo grave, puede causar fatiga, prdida de peso, debilidad y confusin. Esto aumenta las probabilidades de tener problemas hepticos ms graves. El hgado es el rgano ms grande del cuerpo. [place] mucho y, cuando est enfermo, en general no muestra signos de advertencia hasta una etapa avanzada. El hgado cumple muchas funciones importantes, que incluyen:  Nurse, mental health los alimentos.  Almacenar vitaminas, hierro y [employer].  Producir protenas.  Producir bilis para la digestin de los alimentos.  Degradar muchos productos, entre ellos, medicamentos, alcohol y algunos txicos. CAUSAS  Hay una variedad de afecciones, medicamentos y txicos diferentes que pueden causar hgado graso. El consumo de demasiadas caloras hace que la grasa se acumule en el hgado. [place] no se procesan y degradan normalmente, tambin pueden acumularse. Determinadas afecciones, como obesidad, diabetes y triglicridos altos, podran ser [place] causas. En general, la [employer] con hgado graso son de edad media y tienen sobrepeso.  Algunas de las causas del hgado graso incluyen:  Consumo excesivo de alcohol.  Desnutricin.  Consumo de corticoides.  Toxicidad por cido valproico.  Ser obeso.  Sndrome de [place].  Txicos.  Tetraciclina en altas  dosis.  Embarazo.  Diabetes.  Hiperlipidemia.  Rpida prdida de peso. Algunas personas presentan hgado graso aunque no tengan ninguna de estas afecciones. SNTOMAS  En la [employer], el hgado graso no causa problemas. Esto significa que es asintomtico.  Puede diagnosticarse a travs de anlisis de [place], adems de una biopsia de hgado.  Es una de las causas ms frecuentes de las pequeas elevaciones de enzimas hepticas en los anlisis de [place].  Los estudios especializados de diagnstico por imgenes del hgado que se Market researcher, tomografas computarizadas o resonancias magnticas pueden sugerir la presencia de hgado [place], pero se necesita una biopsia para confirmarlo.  Una biopsia implica extraer Radford Pax de tejido del hgado con Maxwell Caul. Luego, un especialista examina la muestra con un microscopio. TRATAMIENTO  Es importante tratar la causa. El hgado graso simple sin una razn mdica puede no Warden/ranger.  La prdida de peso, la restriccin de grasas y la actividad fsica en los pacientes con sobrepeso tienen resultados imprevisibles, pero vale la pena intentar con estas medidas.  El hgado graso causado por el consumo excesivo de alcohol puede no mejorar incluso cuando se deja de beber.  El control adecuado de la diabetes puede reducir el hgado graso.  Reduzca los triglicridos a travs de la dieta o de medicamentos, o con ambos mtodos.  Consuma una dieta saludable y equilibrada.  Aumente la actividad fsica.  Haga controles frecuentes con un especialista en hgado.  No hay tratamientos quirrgicos o mdicos para el hgado graso o la esteatohepatitis no Risk analyst, Environmental health practitioner la dieta y aumentar la actividad fsica puede ayudar a Product/process development scientist o a Scientist, research (medical) del dao. PRONSTICO  El hgado graso puede no causar  dao o puede inflamar el hgado. Esto se denomina esteatohepatitis. Cuando se relaciona con el  abuso de alcohol, se conoce como esteatohepatitis alcohlica. A menudo, no se vincula con el alcohol. En este caso, se llama esteatohepatitis no alcohlica. Con el tiempo, el hgado puede endurecerse y formar tejido cicatricial. Esta afeccin se llama cirrosis. La cirrosis es grave y puede llevar a la insuficiencia heptica o al cncer. La esteatohepatitis no alcohlica es una de las causas principales de la cirrosis. Alrededor del 10% al 20% de los estadounidenses tienen hgado [place], y una cantidad Garment/textile technologist, del 2% al 5%, tiene esteatohepatitis no Risk analyst. Document Released: 12/17/2012 [facility] Patient Information 2015 [place]. This information is not intended to replace advice given to you by your health care provider. Make sure you discuss any questions you have with your health care provider.

## 2014-03-08 NOTE — Progress Notes (Signed)
Patient ID: Amanda Santiago, female   DOB: [birth year], 48 y.o.   MRN: [id number]  CC: RUQ pain, left knee pain  HPI: Amanda Santiago is a 48 y.o. female here today for a follow up visit.  Patient has past medical history of arthritis and kidney stones.  Patient reports that she has been having RUQ pain for 20 days.  She states that some days the pain becomes very severe.  She states that while sitting she has a pulsating pain and when she stands to walk it becomes a sharp pain.  No nausea or vomiting.  Not associated with food intake. Worse at night with movement.  Today she is concerned about left knee pain that has been present for several years.  She is established with sports medicine who gives her injections in her knee.  She reports that after the injections she is able to walk better but she continues to have sharp pains in her left knee. She was told that if she continues to have pain she was told that she may need another referral to return back to sports medicine.   Allergies  Allergen Reactions  . Alk Sq Cat [Cat Hair Extract]   . Aspirin Swelling  . Morphine And Related Swelling  . Shellfish-Derived Products Swelling   Past Medical History  Diagnosis Date  . Arthritis   . Kidney stone 9-10 years ago   Current Outpatient Prescriptions on File Prior to Visit  Medication Sig Dispense Refill  . amoxicillin-clavulanate (AUGMENTIN) 875-125 MG per tablet Take 1 tablet by mouth 2 (two) times daily. (Patient not taking: Reported on 03/08/2014) 20 tablet 0  . clotrimazole (LOTRIMIN) 1 % cream Apply 1 application topically 2 (two) times daily. (Patient not taking: Reported on 03/08/2014) 30 g 0  . diclofenac (VOLTAREN) 75 MG EC tablet Take 1 tablet (75 mg total) by mouth 2 (two) times daily. (Patient not taking: Reported on 03/08/2014) 60 tablet 1  . lactobacillus acidophilus & bulgar (LACTINEX) chewable tablet Chew 1 tablet by mouth 3 (three) times daily with meals. (Patient not  taking: Reported on 03/08/2014) 90 tablet 0  . metFORMIN (GLUCOPHAGE-XR) 500 MG 24 hr tablet TAKE 1 TABLET BY MOUTH ONCE DAILY WITH BREAKFAST (Patient not taking: Reported on 03/08/2014) 30 tablet 3  . naproxen (NAPROSYN) 500 MG tablet Take 1 tablet (500 mg total) by mouth 2 (two) times daily with a meal. (Patient not taking: Reported on 03/08/2014) 30 tablet 0  . nystatin (MYCOSTATIN/NYSTOP) 100000 UNIT/GM POWD Use twice daily in skin folds (Patient not taking: Reported on 03/08/2014) 30 g 2  . ondansetron (ZOFRAN) 4 MG tablet Take 1 tablet (4 mg total) by mouth every 8 (eight) hours as needed for nausea or vomiting. (Patient not taking: Reported on 03/08/2014) 10 tablet 0  . Specialty Vitamins Products (WEIGHT LOSS DAILY MULTI) TABS Take 1 tablet by mouth 4 (four) times daily.    . traMADol (ULTRAM) 50 MG tablet   0   No current facility-administered medications on file prior to visit.   Family History  Problem Relation Age of Onset  . Diabetes Mother   . Hyperlipidemia Mother   . Hypertension Mother    History   Social History  . Marital Status: Married    Spouse Name: N/A    Number of Children: N/A  . Years of Education: N/A   Occupational History  . Not on file.   Social History Main Topics  . Smoking status: Former Smoker -- 1.50 packs/day for  18 years    Types: Cigarettes  . Smokeless tobacco: Former Systems developer    Quit date: 03/12/1997  . Alcohol Use: Not on file  . Drug Use: Not on file  . Sexual Activity: Not on file   Other Topics Concern  . Not on file   Social History Narrative    Review of Systems  Constitutional: Negative for fever and chills.  HENT: Positive for ear pain and sore throat.        Ear itching   Eyes: Negative.   Respiratory: Negative for cough, sputum production and shortness of breath.   Cardiovascular: Negative.   Gastrointestinal: Positive for abdominal pain (RUQ). Negative for nausea, vomiting, constipation and blood in stool.   Genitourinary: Negative.   Musculoskeletal: Positive for joint pain.  Neurological: Negative for headaches.     Objective:   Filed Vitals:   03/08/14 1700  BP: 121/80  Pulse: 71  Temp: 97.8 F (36.6 C)  Resp: 14    Physical Exam  Constitutional: She is oriented to person, place, and time.  Cardiovascular: Normal rate, regular rhythm and normal heart sounds.   Pulmonary/Chest: Effort normal and breath sounds normal.  Abdominal: Bowel sounds are normal. She exhibits no pulsatile liver, no abdominal bruit and no pulsatile midline mass. There is hepatomegaly (very hard). There is tenderness in the right upper quadrant.  Neurological: She is alert and oriented to person, place, and time.  Skin: Skin is warm and dry.     Lab Results  Component Value Date   WBC 11.7* 12/23/2013   HGB 15.0 12/23/2013   HCT 44.1 12/23/2013   MCV 87.8 12/23/2013   PLT 252 12/23/2013   Lab Results  Component Value Date   CREATININE 0.62 12/23/2013   BUN 17 12/23/2013   NA 138 12/23/2013   K 4.1 12/23/2013   CL 99 12/23/2013   CO2 25 12/23/2013    Lab Results  Component Value Date   HGBA1C 5.9 12/23/2013   Lipid Panel     Component Value Date/Time   CHOL 88 09/14/2013 1012   TRIG 116 09/14/2013 1012   HDL 41 09/14/2013 1012   CHOLHDL 2.1 09/14/2013 1012   VLDL 23 09/14/2013 1012   LDLCALC 24 09/14/2013 1012       Assessment and plan:   Amanda Santiago was seen today for follow-up.  Diagnoses and associated orders for this visit:  Fatty liver/Abdominal mass - MR Liver W Contrast; Future - POCT urinalysis dipstick  Dental crowns status (missing) - Ambulatory referral to Dentistry  Other Orders - Refill metFORMIN (GLUCOPHAGE-XR) 500 MG 24 hr tablet; TAKE 1 TABLET BY MOUTH ONCE DAILY WITH BREAKFAST  Due to language barrier, an interpreter was present during the history-taking and subsequent discussion (and for part of the physical exam) with this patient.   Return for will  call with results.        Chari Manning, NP-C [facility] [phone] 03/08/2014, 5:31 PM

## 2014-03-08 NOTE — Progress Notes (Signed)
Pt is here today wanting to address her pain in her right upper abdomen and in her left knee.  Pt is requesting a referral to a dentist.

## 2014-03-09 ENCOUNTER — Telehealth: Payer: Self-pay | Admitting: Internal Medicine

## 2014-03-09 NOTE — Telephone Encounter (Signed)
Patient called to check on the status of the appointment she was going to get in order to get an MRI, patient was not very clear on what appointment she needs but does believe it is for the MRI for the pain on her right side. Patient states that she has been in pain all night and would like to get the appointment as soon as possible. Please f/u with pt.

## 2014-03-15 NOTE — Telephone Encounter (Signed)
Appointment On Mar 26, 2014 at 09:00am arriving at 08:45 NPO 4 hr prior. Left voice message with appointment information, return call if any question

## 2014-03-22 ENCOUNTER — Ambulatory Visit (INDEPENDENT_AMBULATORY_CARE_PROVIDER_SITE_OTHER): Payer: Self-pay | Admitting: Sports Medicine

## 2014-03-22 ENCOUNTER — Encounter: Payer: Self-pay | Admitting: Sports Medicine

## 2014-03-22 ENCOUNTER — Ambulatory Visit: Payer: Self-pay | Admitting: Sports Medicine

## 2014-03-22 VITALS — BP 142/83 | HR 72 | Ht 64.0 in | Wt 295.0 lb

## 2014-03-22 DIAGNOSIS — M25562 Pain in left knee: Secondary | ICD-10-CM

## 2014-03-22 DIAGNOSIS — M25561 Pain in right knee: Secondary | ICD-10-CM

## 2014-03-22 MED ORDER — TRAMADOL HCL 50 MG PO TABS: ORAL_TABLET | ORAL | Status: DC

## 2014-03-22 NOTE — Progress Notes (Signed)
Patient ID: Amanda Santiago, female   DOB: 1966-01-24, 49 y.o.   MRN: [id number]   Patient comes in today for follow-up on bilateral knee pain. Recent cortisone injections provided her with a positive yet limited response. Pain has now returned. Physical exam was not repeated. We simply talked about getting some plain x-rays of her knee so that I can evaluate the degree of osteoarthritis present. I will see her back in the office in a week or two to discuss these findings. In the meantime, she has some diclofenac which she can continue to take and I will give her a prescription for tramadol as well.

## 2014-03-26 ENCOUNTER — Ambulatory Visit (HOSPITAL_COMMUNITY): Admission: RE | Admit: 2014-03-26 | Payer: Self-pay | Source: Ambulatory Visit

## 2014-03-31 ENCOUNTER — Ambulatory Visit
Admission: RE | Admit: 2014-03-31 | Discharge: 2014-03-31 | Disposition: A | Discharge status: Home / Self Care | Payer: No Typology Code available for payment source | Source: Ambulatory Visit | Attending: Sports Medicine | Admitting: Sports Medicine

## 2014-03-31 DIAGNOSIS — M25561 Pain in right knee: Secondary | ICD-10-CM

## 2014-03-31 DIAGNOSIS — M25562 Pain in left knee: Principal | ICD-10-CM

## 2014-04-05 ENCOUNTER — Encounter: Payer: Self-pay | Admitting: Family Medicine

## 2014-04-05 ENCOUNTER — Ambulatory Visit (INDEPENDENT_AMBULATORY_CARE_PROVIDER_SITE_OTHER): Payer: Self-pay | Admitting: Family Medicine

## 2014-04-05 VITALS — BP 136/86 | HR 65 | Ht 64.0 in | Wt 295.0 lb

## 2014-04-05 DIAGNOSIS — M25572 Pain in left ankle and joints of left foot: Secondary | ICD-10-CM

## 2014-04-05 DIAGNOSIS — M25562 Pain in left knee: Secondary | ICD-10-CM

## 2014-04-05 DIAGNOSIS — M170 Bilateral primary osteoarthritis of knee: Secondary | ICD-10-CM

## 2014-04-05 DIAGNOSIS — M25561 Pain in right knee: Secondary | ICD-10-CM

## 2014-04-05 MED ORDER — METHYLPREDNISOLONE ACETATE 40 MG/ML IJ SUSP
40.0000 mg | Freq: Once | INTRAMUSCULAR | Status: AC
  Administered 2014-04-05: 40 mg via INTRA_ARTICULAR

## 2014-04-05 NOTE — Patient Instructions (Signed)
Interpreter for visit is Zenda Alpers

## 2014-04-06 DIAGNOSIS — M179 Osteoarthritis of knee, unspecified: Secondary | ICD-10-CM

## 2014-04-06 DIAGNOSIS — M25579 Pain in unspecified ankle and joints of unspecified foot: Secondary | ICD-10-CM | POA: Insufficient documentation

## 2014-04-06 DIAGNOSIS — M1710 Unilateral primary osteoarthritis, unspecified knee: Secondary | ICD-10-CM

## 2014-04-06 HISTORY — DX: Pain in unspecified ankle and joints of unspecified foot: M25.579

## 2014-04-06 HISTORY — DX: Morbid (severe) obesity due to excess calories: E66.01

## 2014-04-06 HISTORY — DX: Osteoarthritis of knee, unspecified: M17.9

## 2014-04-06 HISTORY — DX: Unilateral primary osteoarthritis, unspecified knee: M17.10

## 2014-04-06 NOTE — Assessment & Plan Note (Signed)
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 I do think she would benefit from orthotic discussed that. She would need to get some lace up shoes and come back for that and she agrees.

## 2014-04-06 NOTE — Assessment & Plan Note (Signed)
Bilateral corticosteroid injections today. We discussed how often she can have these. They usually last about 6 weeks. Typically most physicians will inject every 3 months. Given her situation I would be willing to inject her a little bit more frequently, perhaps every 8-10 weeks with the goal that she would be more mobile and get very serious about weight loss. She has successfully lost weight in the past and I encouraged her to get back into that program.

## 2014-04-06 NOTE — Progress Notes (Signed)
Patient ID: Amanda Santiago, female   DOB: [birth year], 49 y.o.   MRN: [id number]  Amanda Santiago - 49 y.o. female MRN [id number]  Date of birth: 09/28/1965    SUBJECTIVE:      Interview conducted with assistance of an interpreter: Blanca Bilateral knee pain, left greater than right which she has had before in new complaint of left ankle/foot pain. She has previously been diagnosed with osteoarthritis. Recently had some new x-rays. Had benefited from corticosteroid injections in the past and would like to consider some today. Would also like to discuss her x-rays and long-term treatment. #2. Ankle/foot pain on the left. She notes that her shoes are wearing unevenly. The pain is in the area of the lateral heel and the distal Achilles, not exactly over the area of the Achilles insertion but about 3 cm proximal to that. No specific injury. Pain has been insidious over the last 4-6 weeks. ROS:     She had some successful weight loss last year but has unfortunately gained some of that back. Bilateral knee pain, some knee swelling on the left. Has noted no erythema or warmth of either knee. She's not had any fever. No other arthralgias noted except as in history of present illness.  PERTINENT  PMH / PSH FH / / SH:  Past Medical, Surgical, Social, and Family History Reviewed & Updated in the EMR.  Pertinent findings include:  Nephrolithiasis, obesity, nonsmoker. No personal history of diabetes mellitus, her last A1c was 5.9.  OBJECTIVE: BP 136/86 mmHg  Pulse 65  Ht 5\' 4"  (1.626 m)  Wt 295 lb (133.811 kg)  BMI 50.61 kg/m2  Physical Exam:  Vital signs are reviewed. GEN.: Well-developed obese female no acute distress KNEES: Bilaterally symmetrical although landmarks are less easily define secondary to habitus. Full extension and flexion bilaterally. Ligaments intact to varus and valgus stress and normal Lachman bilaterally. Crepitus on extension left greater than right. Extremity: Calf is  soft NEURO:distally she has intact sensation to soft touch bilaterally bilateral feet. VASCULAR: Posterior tibialis and dorsalis pedis pulses 2+ bilaterally equal. FEET: Area of pain she points to is in the Achilles tendon but is not at the insertion. Is about 3-4 cm proximal. There is no defect here. Very mildly tender to palpation. She has pes planus, midfoot collapse on the left. Examination of her shoes reveals extreme amount of wear on the medial heel counter. GAIT: Antalgic. Foot plan on the left is toe out and very slight shortened stride length.  INJECTION: Patient was given informed consent, signed copy in the chart. Appropriate time out was taken. Area prepped and draped in usual sterile fashion. 1 cc of methylprednisolone 40 mg/ml plus  4 cc of 1% lidocaine without epinephrine was injected into the bilateral knees using a(n) anterior medial approach. The patient tolerated the procedure well. There were no complications. Post procedure instructions were given.   ASSESSMENT & PLAN:  See problem based charting & AVS for pt instructions.

## 2014-04-12 ENCOUNTER — Ambulatory Visit (HOSPITAL_COMMUNITY)
Admission: RE | Admit: 2014-04-12 | Discharge: 2014-04-12 | Disposition: A | Discharge status: Home / Self Care | Payer: No Typology Code available for payment source | Source: Ambulatory Visit | Attending: Internal Medicine | Admitting: Internal Medicine

## 2014-04-12 DIAGNOSIS — K760 Fatty (change of) liver, not elsewhere classified: Secondary | ICD-10-CM | POA: Insufficient documentation

## 2014-04-12 DIAGNOSIS — R1011 Right upper quadrant pain: Secondary | ICD-10-CM | POA: Insufficient documentation

## 2014-04-12 DIAGNOSIS — R1900 Intra-abdominal and pelvic swelling, mass and lump, unspecified site: Secondary | ICD-10-CM

## 2014-04-12 MED ORDER — GADOBENATE DIMEGLUMINE 529 MG/ML IV SOLN
20.0000 mL | Freq: Once | INTRAVENOUS | Status: AC | PRN
  Administered 2014-04-12: 20 mL via INTRAVENOUS

## 2014-04-14 ENCOUNTER — Telehealth: Payer: Self-pay | Admitting: *Deleted

## 2014-04-14 NOTE — Telephone Encounter (Signed)
-----   Message from Lance Bosch, NP sent at 04/13/2014  8:49 PM EST ----- Normal liver MRI

## 2014-04-14 NOTE — Telephone Encounter (Signed)
Left voice message with normal MRI If any question return call (LVM in Spanish)

## 2014-04-19 ENCOUNTER — Encounter: Payer: Self-pay | Admitting: Family Medicine

## 2014-04-19 ENCOUNTER — Ambulatory Visit (INDEPENDENT_AMBULATORY_CARE_PROVIDER_SITE_OTHER): Payer: No Typology Code available for payment source | Admitting: Family Medicine

## 2014-04-19 VITALS — BP 165/94 | HR 61 | Ht 64.0 in | Wt 295.0 lb

## 2014-04-19 DIAGNOSIS — M25562 Pain in left knee: Secondary | ICD-10-CM

## 2014-04-19 DIAGNOSIS — M25561 Pain in right knee: Secondary | ICD-10-CM

## 2014-04-19 DIAGNOSIS — M25572 Pain in left ankle and joints of left foot: Secondary | ICD-10-CM

## 2014-04-19 DIAGNOSIS — M25571 Pain in right ankle and joints of right foot: Secondary | ICD-10-CM

## 2014-04-19 NOTE — Patient Instructions (Signed)
Interpreter for pt visit today is Doretha Imus

## 2014-04-20 NOTE — Progress Notes (Signed)
Patient ID: Amanda Santiago, female   DOB: [birth year], 49 y.o.   MRN: [id number] Today's office visit conducted with the aid of an interpreter.  At last office visit I had given her bilateral knee injections which seem to help significantly. She's had about 50% improvement in her pain. We had also discussed possibly getting her into some orthotics that she was having some left ankle pain. She's here today for evaluation of custom molded orthotics.  Objective: Obese Hispanic female no acute distress FEET: Bilaterally pes planus. She has an extremely broad foot withbi lateral transverse arch collapse. She's tender to palpation at the left medial ankle and during her stance phase of her gait she has some medial collapse of the subtalar joint. She has intact sensation to soft touch bilateral feet. VASCULAR: Dorsalis and posterior tibialis pulses are 2+ bilaterally equal. SKIN: Slight callus formation but no fissuring. There are no ecchymoses, no lesions, no rash she has normal capillary refill and skin color bilateral feet.  Patient was fitted for a : standard, cushioned, semi-rigid orthotic. The orthotic was heated, placed on the orthotic stand. The patient was positioned in subtalar neutral position and 10 degrees of ankle dorsiflexion in a weight bearing stance on the heated orthotic blank After completion of molding, a stable base was applied to the orthotic blank. The blank was ground to a stable position for weight bearing. Blank: Red size 13 Base: White F for Posting: Slight amount of first ray post mostly for padding especially on the right foot. Also put some scaphoid pads on the top of her orthotic.  Face to face time spent in evaluation, measurement and manufacture of custom molded orthotic was 40 minutes.  Hopefully will this will help her ankle and foot pain some. I told her to bring them back if she needs adjustments.

## 2014-06-01 ENCOUNTER — Ambulatory Visit: Payer: No Typology Code available for payment source | Attending: Internal Medicine | Admitting: Internal Medicine

## 2014-06-01 ENCOUNTER — Encounter: Payer: Self-pay | Admitting: Internal Medicine

## 2014-06-01 VITALS — BP 122/80 | HR 79 | Temp 98.6°F | Resp 16 | Ht 64.0 in | Wt 295.0 lb

## 2014-06-01 DIAGNOSIS — R7309 Other abnormal glucose: Secondary | ICD-10-CM | POA: Insufficient documentation

## 2014-06-01 DIAGNOSIS — J309 Allergic rhinitis, unspecified: Secondary | ICD-10-CM | POA: Insufficient documentation

## 2014-06-01 DIAGNOSIS — R7303 Prediabetes: Secondary | ICD-10-CM

## 2014-06-01 DIAGNOSIS — K088 Other specified disorders of teeth and supporting structures: Secondary | ICD-10-CM | POA: Insufficient documentation

## 2014-06-01 DIAGNOSIS — Z79899 Other long term (current) drug therapy: Secondary | ICD-10-CM | POA: Insufficient documentation

## 2014-06-01 DIAGNOSIS — K0889 Other specified disorders of teeth and supporting structures: Secondary | ICD-10-CM

## 2014-06-01 LAB — POCT GLYCOSYLATED HEMOGLOBIN (HGB A1C): HEMOGLOBIN A1C: 5.9

## 2014-06-01 LAB — GLUCOSE, POCT (MANUAL RESULT ENTRY): POC Glucose: 94 mg/dl (ref 70–99)

## 2014-06-01 MED ORDER — METFORMIN HCL ER 500 MG PO TB24
ORAL_TABLET | ORAL | Status: DC
Start: 2014-06-01 — End: 2015-04-04

## 2014-06-01 NOTE — Progress Notes (Signed)
Patient ID: Amanda Santiago, female   DOB: 07/14/1965, 49 y.o.   MRN: [id number]  CC: allergy, dental pain HPI: Amanda Santiago is a 49 y.o. female here today for a follow up visit.  Patient has past medical history of arthritis.  She states that she has been having upper left back tooth pain since last night and she does not have a dentist due to lack of insurance. Patient reports that 10 years ago she was diagnosed with a seafood allergy and was told that she almost died. She reports that she began to eat seafood again one year later without any problems. 15 days ago she reports that she had shrimp that she cooked at home and she began to have hives, swelling, and dry skin on her face. She took claritin and zyrtec which made the symptoms disappear. She denies swelling of lips, tongue, or throat closure.    Allergies  Allergen Reactions  . Alk Sq Cat [Cat Hair Extract]   . Aspirin Swelling  . Morphine And Related Swelling  . Shellfish-Derived Products Swelling   Past Medical History  Diagnosis Date  . Arthritis   . Kidney stone 9-10 years ago   Current Outpatient Prescriptions on File Prior to Visit  Medication Sig Dispense Refill  . ibuprofen (ADVIL,MOTRIN) 200 MG tablet Take 200 mg by mouth every 6 (six) hours as needed.    . metFORMIN (GLUCOPHAGE-XR) 500 MG 24 hr tablet TAKE 1 TABLET BY MOUTH ONCE DAILY WITH BREAKFAST 30 tablet 3  . amoxicillin-clavulanate (AUGMENTIN) 875-125 MG per tablet   0  . clotrimazole (LOTRIMIN) 1 % cream Apply 1 application topically 2 (two) times daily. (Patient not taking: Reported on 03/08/2014) 30 g 0  . diclofenac (VOLTAREN) 75 MG EC tablet Take 1 tablet (75 mg total) by mouth 2 (two) times daily. (Patient not taking: Reported on 03/08/2014) 60 tablet 1  . lactobacillus acidophilus & bulgar (LACTINEX) chewable tablet Chew 1 tablet by mouth 3 (three) times daily with meals. (Patient not taking: Reported on 03/08/2014) 90 tablet 0  . nystatin  (MYCOSTATIN/NYSTOP) 100000 UNIT/GM POWD Use twice daily in skin folds (Patient not taking: Reported on 03/08/2014) 30 g 2  . Specialty Vitamins Products (WEIGHT LOSS DAILY MULTI) TABS Take 1 tablet by mouth 4 (four) times daily.    . traMADol (ULTRAM) 50 MG tablet Take one tablet twice a day as needed for pain (Patient not taking: Reported on 06/01/2014) 60 tablet 0   No current facility-administered medications on file prior to visit.   Family History  Problem Relation Age of Onset  . Diabetes Mother   . Hyperlipidemia Mother   . Hypertension Mother    History   Social History  . Marital Status: Married    Spouse Name: N/A  . Number of Children: N/A  . Years of Education: N/A   Occupational History  . Not on file.   Social History Main Topics  . Smoking status: Former Smoker -- 1.50 packs/day for 18 years    Types: Cigarettes  . Smokeless tobacco: Former Systems developer    Quit date: 03/12/1997  . Alcohol Use: Not on file  . Drug Use: Not on file  . Sexual Activity: Not on file   Other Topics Concern  . Not on file   Social History Narrative    Review of Systems  HENT: Positive for congestion.        Rhinitis Left upper molar pain  Eyes:       Watery eyes  Skin: Positive for itching and rash.  All other systems reviewed and are negative.     Objective:   Filed Vitals:   06/01/14 1504  BP: 122/80  Pulse: 79  Temp: 98.6 F (37 C)  Resp: 16    Physical Exam  Constitutional: She is oriented to person, place, and time.  HENT:  Mouth/Throat: Dental caries present.    Cardiovascular: Normal rate, regular rhythm and normal heart sounds.   Pulmonary/Chest: Effort normal and breath sounds normal.  Abdominal: Soft. Bowel sounds are normal.  Neurological: She is alert and oriented to person, place, and time.  Skin: Skin is warm and dry. No rash noted.     Lab Results  Component Value Date   WBC 11.7* 12/23/2013   HGB 15.0 12/23/2013   HCT 44.1 12/23/2013    MCV 87.8 12/23/2013   PLT 252 12/23/2013   Lab Results  Component Value Date   CREATININE 0.62 12/23/2013   BUN 17 12/23/2013   NA 138 12/23/2013   K 4.1 12/23/2013   CL 99 12/23/2013   CO2 25 12/23/2013    Lab Results  Component Value Date   HGBA1C 5.90 06/01/2014   Lipid Panel     Component Value Date/Time   CHOL 88 09/14/2013 1012   TRIG 116 09/14/2013 1012   HDL 41 09/14/2013 1012   CHOLHDL 2.1 09/14/2013 1012   VLDL 23 09/14/2013 1012   LDLCALC 24 09/14/2013 1012       Assessment and plan:   Amanda Santiago was seen today for follow-up.  Diagnoses and all orders for this visit:  Prediabetes Orders: -     Glucose (CBG) -     HgB A1c -    refill metFORMIN (GLUCOPHAGE-XR) 500 MG 24 hr tablet; TAKE 1 TABLET BY MOUTH ONCE DAILY WITH BREAKFAST Patient will continue metformin in hopes that it will assist with some weight loss.  Morbid obesity Weight loss discussed at length and its complications to health.  Patient will loss 10 months by next visit in 3 months.  Diet and exercise discussed as well as calorie intake.  Pain, dental Orders: -     Ambulatory referral to Dentistry  Allergic rhinitis, unspecified allergic rhinitis type Patient will continue to use claritin daily   Return in about 3 months (around 09/01/2014) for prediabetes/weight .  Due to language barrier, an interpreter was present during the history-taking and subsequent discussion (and for part of the physical exam) with this patient.       Chari Manning, NP-C [facility] [phone] 06/01/2014, 3:41 PM

## 2014-06-01 NOTE — Patient Instructions (Signed)
Ejercicios para perder peso (Exercise to Lose Weight) La actividad fsica y Ardelia Mems dieta saludable ayudan a perder peso. El mdico podr sugerirle ejercicios especficos. IDEAS Y CONSEJOS PARA HACER EJERCICIOS  Elija opciones econmicas que disfrute hacer , como caminar, andar en bicicleta o los vdeos para ejercitarse.  Utilice las Clinical cytogeneticist del ascensor.  Camine durante la hora del almuerzo.  Estacione el auto lejos del lugar de [place] o [place].  Concurra a un gimnasio o tome clases de gimnasia.  Comience con 5  10 minutos de actividad fsica por da. Ejercite hasta 30 minutos, 4 a 6 das por semana.  Utilice zapatos que tengan un buen soporte y ropas cmodas.  Elongue antes y despus de Chief Technology Officer.  Ejercite hasta que aumente la respiracin y el corazn palpite rpido.  Beba agua extra cuando ejercite.  No haga ejercicio Contractor, sentirse mareado o que le falte mucho el aire. La actividad fsica puede quemar alrededor de 150 caloras.  Correr 20 cuadras en 15 minutos.  Jugar vley durante 45 a 60 minutos.  Limpiar y encerar el auto durante 45 a 60 minutos.  Jugar ftbol americano de toque.  Caminar 25 cuadras en 35 minutos.  Empujar un cochecito 20 cuadras en 30 minutos.  Jugar baloncesto durante 30 minutos.  Rastrillar hojas secas durante 30 minutos.  Andar en bicicleta 80 cuadras en 30 minutos.  Caminar 30 cuadras en 30 minutos.  Bailar durante 30 minutos.  Quitar la nieve con una pala durante 15 minutos.  Nadar vigorosamente durante 20 minutos.  Subir escaleras durante 15 minutos.  Andar en bicicleta 60 cuadras durante 15 minutos.  Arreglar el jardn entre 30 y 34 minutos.  Saltar a la soga durante 15 minutos.  Limpiar vidrios o pisos durante 45 a 60 minutos. Document Released: 06/02/2010 Document Revised: 05/21/2011 [facility] Patient Information 2015 [place]. This information is not intended to replace advice given to you  by your health care provider. Make sure you discuss any questions you have with your health care provider.

## 2014-06-01 NOTE — Progress Notes (Signed)
Pt is here today b/c she ate some seafood and had an allergy. Pt has some spots on her feet that seem to come every year that are bothersome.

## 2014-06-02 ENCOUNTER — Telehealth: Payer: Self-pay | Admitting: Internal Medicine

## 2014-06-02 ENCOUNTER — Ambulatory Visit: Payer: No Typology Code available for payment source | Attending: Internal Medicine | Admitting: Internal Medicine

## 2014-06-02 VITALS — BP 147/84 | HR 83 | Temp 98.5°F | Resp 16 | Ht 64.0 in | Wt 292.0 lb

## 2014-06-02 DIAGNOSIS — K0889 Other specified disorders of teeth and supporting structures: Secondary | ICD-10-CM

## 2014-06-02 DIAGNOSIS — K088 Other specified disorders of teeth and supporting structures: Secondary | ICD-10-CM

## 2014-06-02 MED ORDER — AMOXICILLIN 500 MG PO CAPS: 500.0000 mg | ORAL_CAPSULE | Freq: Three times a day (TID) | ORAL | Status: AC

## 2014-06-02 MED ORDER — TRAMADOL HCL 50 MG PO TABS: 50.0000 mg | ORAL_TABLET | Freq: Three times a day (TID) | ORAL | Status: DC | PRN

## 2014-06-02 NOTE — Progress Notes (Signed)
Patient here c/o left upper tooth pain Pain is intense 9/10 throbbing and radiates into face Asking for antibiotics

## 2014-06-02 NOTE — Telephone Encounter (Signed)
Patient is calling to speak to nurse about her tooth, she states that she is in a lot of pain and believes that it is infected. Patient would like to be prescribed some antibiotics. Please f/u with pt with an interpreter.

## 2014-06-10 ENCOUNTER — Emergency Department (HOSPITAL_COMMUNITY)
Admission: EM | Admit: 2014-06-10 | Discharge: 2014-06-10 | Disposition: A | Discharge status: Home / Self Care | Payer: No Typology Code available for payment source | Attending: Emergency Medicine | Admitting: Emergency Medicine

## 2014-06-10 ENCOUNTER — Emergency Department (HOSPITAL_COMMUNITY): Payer: No Typology Code available for payment source

## 2014-06-10 ENCOUNTER — Encounter (HOSPITAL_COMMUNITY): Payer: Self-pay | Admitting: *Deleted

## 2014-06-10 DIAGNOSIS — S29001A Unspecified injury of muscle and tendon of front wall of thorax, initial encounter: Secondary | ICD-10-CM | POA: Insufficient documentation

## 2014-06-10 DIAGNOSIS — Z8739 Personal history of other diseases of the musculoskeletal system and connective tissue: Secondary | ICD-10-CM | POA: Insufficient documentation

## 2014-06-10 DIAGNOSIS — Z87891 Personal history of nicotine dependence: Secondary | ICD-10-CM | POA: Diagnosis not present

## 2014-06-10 DIAGNOSIS — Z791 Long term (current) use of non-steroidal anti-inflammatories (NSAID): Secondary | ICD-10-CM | POA: Insufficient documentation

## 2014-06-10 DIAGNOSIS — Z79899 Other long term (current) drug therapy: Secondary | ICD-10-CM | POA: Insufficient documentation

## 2014-06-10 DIAGNOSIS — Y9389 Activity, other specified: Secondary | ICD-10-CM | POA: Insufficient documentation

## 2014-06-10 DIAGNOSIS — Y998 Other external cause status: Secondary | ICD-10-CM | POA: Diagnosis not present

## 2014-06-10 DIAGNOSIS — Z87442 Personal history of urinary calculi: Secondary | ICD-10-CM | POA: Diagnosis not present

## 2014-06-10 DIAGNOSIS — S3992XA Unspecified injury of lower back, initial encounter: Secondary | ICD-10-CM | POA: Diagnosis not present

## 2014-06-10 DIAGNOSIS — Y92410 Unspecified street and highway as the place of occurrence of the external cause: Secondary | ICD-10-CM | POA: Insufficient documentation

## 2014-06-10 MED ORDER — IBUPROFEN 400 MG PO TABS
400.0000 mg | ORAL_TABLET | Freq: Once | ORAL | Status: AC
  Administered 2014-06-10: 400 mg via ORAL
  Filled 2014-06-10: qty 1
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
## 2014-06-15 ENCOUNTER — Encounter (HOSPITAL_COMMUNITY): Payer: Self-pay

## 2014-06-15 ENCOUNTER — Encounter (HOSPITAL_COMMUNITY): Payer: Self-pay | Admitting: Emergency Medicine

## 2014-06-15 ENCOUNTER — Emergency Department (INDEPENDENT_AMBULATORY_CARE_PROVIDER_SITE_OTHER): Payer: No Typology Code available for payment source

## 2014-06-15 ENCOUNTER — Emergency Department (INDEPENDENT_AMBULATORY_CARE_PROVIDER_SITE_OTHER)
Admission: EM | Admit: 2014-06-15 | Discharge: 2014-06-15 | Disposition: A | Discharge status: Home / Self Care | Payer: Self-pay | Source: Home / Self Care | Attending: Family Medicine | Admitting: Family Medicine

## 2014-06-15 ENCOUNTER — Emergency Department (HOSPITAL_COMMUNITY)
Admission: EM | Admit: 2014-06-15 | Discharge: 2014-06-15 | Disposition: A | Discharge status: Home / Self Care | Payer: No Typology Code available for payment source | Attending: Emergency Medicine | Admitting: Emergency Medicine

## 2014-06-15 ENCOUNTER — Emergency Department (HOSPITAL_COMMUNITY): Payer: No Typology Code available for payment source

## 2014-06-15 DIAGNOSIS — R0789 Other chest pain: Secondary | ICD-10-CM

## 2014-06-15 DIAGNOSIS — Y998 Other external cause status: Secondary | ICD-10-CM | POA: Insufficient documentation

## 2014-06-15 DIAGNOSIS — S20219A Contusion of unspecified front wall of thorax, initial encounter: Secondary | ICD-10-CM | POA: Insufficient documentation

## 2014-06-15 DIAGNOSIS — M1990 Unspecified osteoarthritis, unspecified site: Secondary | ICD-10-CM | POA: Diagnosis not present

## 2014-06-15 DIAGNOSIS — S3992XA Unspecified injury of lower back, initial encounter: Secondary | ICD-10-CM | POA: Insufficient documentation

## 2014-06-15 DIAGNOSIS — Z87891 Personal history of nicotine dependence: Secondary | ICD-10-CM | POA: Diagnosis not present

## 2014-06-15 DIAGNOSIS — Y92410 Unspecified street and highway as the place of occurrence of the external cause: Secondary | ICD-10-CM | POA: Insufficient documentation

## 2014-06-15 DIAGNOSIS — Z87442 Personal history of urinary calculi: Secondary | ICD-10-CM | POA: Diagnosis not present

## 2014-06-15 DIAGNOSIS — Z79899 Other long term (current) drug therapy: Secondary | ICD-10-CM | POA: Insufficient documentation

## 2014-06-15 DIAGNOSIS — Y9389 Activity, other specified: Secondary | ICD-10-CM | POA: Insufficient documentation

## 2014-06-15 DIAGNOSIS — Z791 Long term (current) use of non-steroidal anti-inflammatories (NSAID): Secondary | ICD-10-CM | POA: Diagnosis not present

## 2014-06-15 DIAGNOSIS — S299XXA Unspecified injury of thorax, initial encounter: Secondary | ICD-10-CM | POA: Diagnosis present

## 2014-06-15 HISTORY — DX: Type 2 diabetes mellitus without complications: E11.9

## 2014-06-15 LAB — I-STAT TROPONIN, ED: Troponin i, poc: 0 ng/mL (ref 0.00–0.08)

## 2014-06-15 LAB — I-STAT CHEM 8, ED
BUN: 17 mg/dL (ref 6–23)
CALCIUM ION: 1.1 mmol/L — AB (ref 1.12–1.23)
Chloride: 99 mmol/L (ref 96–112)
Creatinine, Ser: 0.5 mg/dL (ref 0.50–1.10)
GLUCOSE: 96 mg/dL (ref 70–99)
HEMATOCRIT: 50 % — AB (ref 36.0–46.0)
HEMOGLOBIN: 17 g/dL — AB (ref 12.0–15.0)
Potassium: 4.1 mmol/L (ref 3.5–5.1)
Sodium: 139 mmol/L (ref 135–145)
TCO2: 26 mmol/L (ref 0–100)

## 2014-06-15 MED ORDER — FENTANYL CITRATE 0.05 MG/ML IJ SOLN
50.0000 ug | Freq: Once | INTRAMUSCULAR | Status: AC
  Administered 2014-06-15: 50 ug via INTRAVENOUS
  Filled 2014-06-15: qty 2

## 2014-06-15 MED ORDER — TRAMADOL HCL 50 MG PO TABS: 50.0000 mg | ORAL_TABLET | Freq: Four times a day (QID) | ORAL | Status: DC | PRN

## 2014-06-15 MED ORDER — IOHEXOL 350 MG/ML SOLN
100.0000 mL | Freq: Once | INTRAVENOUS | Status: AC | PRN
  Administered 2014-06-15: 100 mL via INTRAVENOUS

## 2014-06-15 MED ORDER — NAPROXEN 500 MG PO TABS: 500.0000 mg | ORAL_TABLET | Freq: Two times a day (BID) | ORAL | Status: DC | PRN
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
## 2014-06-15 NOTE — ED Notes (Signed)
Patient transported to CT 
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
## 2014-06-15 NOTE — ED Notes (Signed)
Unable to obtain IV access.

## 2014-06-15 NOTE — ED Notes (Signed)
Blood drawn by Roxanne Gates. Blood brought to mini-lab and handed to phlebotomist by Erlene Quan EMT.
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
## 2014-06-21 ENCOUNTER — Ambulatory Visit: Payer: No Typology Code available for payment source | Attending: Internal Medicine | Admitting: Family Medicine

## 2014-06-21 ENCOUNTER — Encounter: Payer: Self-pay | Admitting: Family Medicine

## 2014-06-21 VITALS — BP 117/77 | HR 71 | Temp 97.6°F | Resp 18 | Ht 64.0 in | Wt 299.0 lb

## 2014-06-21 DIAGNOSIS — R0789 Other chest pain: Secondary | ICD-10-CM | POA: Insufficient documentation

## 2014-06-21 DIAGNOSIS — E119 Type 2 diabetes mellitus without complications: Secondary | ICD-10-CM | POA: Diagnosis not present

## 2014-06-21 DIAGNOSIS — R079 Chest pain, unspecified: Secondary | ICD-10-CM

## 2014-06-21 HISTORY — DX: Other chest pain: R07.89

## 2014-06-21 LAB — GLUCOSE, POCT (MANUAL RESULT ENTRY): POC GLUCOSE: 129 mg/dL — AB (ref 70–99)

## 2014-06-21 MED ORDER — ALBUTEROL SULFATE HFA 108 (90 BASE) MCG/ACT IN AERS: 2.0000 | INHALATION_SPRAY | Freq: Four times a day (QID) | RESPIRATORY_TRACT | Status: DC | PRN
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
## 2014-06-22 NOTE — Progress Notes (Signed)
Quick Note:  Labs addressed at office as well as medications and patient was made aware. ______ 

## 2014-06-27 ENCOUNTER — Encounter: Payer: Self-pay | Admitting: Internal Medicine

## 2014-06-27 NOTE — Progress Notes (Signed)
   Subjective:    Patient ID: Amanda Santiago, female    DOB: [birth year], 49 y.o.   MRN: [id number]  Dental Pain  This is a new problem. The current episode started in the past 7 days. The problem occurs constantly. The problem has been unchanged. The pain is at a severity of 8/10. Associated symptoms include facial pain. Pertinent negatives include no difficulty swallowing or fever.     Review of Systems  Constitutional: Negative for fever and diaphoresis.  HENT: Positive for dental problem. Negative for ear pain and facial swelling.   All other systems reviewed and are negative.      Objective:   Physical Exam  Constitutional: She is oriented to person, place, and time.  HENT:  Right Ear: External ear normal.  Left Ear: External ear normal.  Mouth/Throat: Oropharynx is clear and moist.  Neurological: She is alert and oriented to person, place, and time.      Assessment & Plan:  [name] was seen today for dental pain.  Diagnoses and all orders for this visit:  Pain, dental Orders: -     Ambulatory referral to Dentistry -     amoxicillin (AMOXIL) 500 MG capsule; Take 1 capsule (500 mg total) by mouth 3 (three) times daily. -     traMADol (ULTRAM) 50 MG tablet; Take 1 tablet (50 mg total) by mouth every 8 (eight) hours as needed for moderate pain. (Patient not taking: Reported on 06/21/2014)  Due to language barrier, an interpreter was present during the history-taking and subsequent discussion (and for part of the physical exam) with this patient.  May f/u as needed  Chari Manning, NP 06/27/2014  10:48 PM

## 2014-07-05 ENCOUNTER — Encounter: Payer: Self-pay | Admitting: Family Medicine

## 2014-07-05 ENCOUNTER — Ambulatory Visit (INDEPENDENT_AMBULATORY_CARE_PROVIDER_SITE_OTHER): Payer: Self-pay | Admitting: Family Medicine

## 2014-07-05 VITALS — BP 146/74 | HR 71 | Ht 64.0 in | Wt 299.0 lb

## 2014-07-05 DIAGNOSIS — M25571 Pain in right ankle and joints of right foot: Secondary | ICD-10-CM

## 2014-07-05 DIAGNOSIS — M25561 Pain in right knee: Secondary | ICD-10-CM

## 2014-07-05 DIAGNOSIS — M25562 Pain in left knee: Secondary | ICD-10-CM

## 2014-07-05 MED ORDER — METHYLPREDNISOLONE ACETATE 40 MG/ML IJ SUSP
40.0000 mg | Freq: Once | INTRAMUSCULAR | Status: AC
  Administered 2014-07-05: 40 mg via INTRA_ARTICULAR

## 2014-07-05 NOTE — Progress Notes (Signed)
Interpreter for visit is Marlena [place]

## 2014-07-06 ENCOUNTER — Encounter: Payer: Self-pay | Admitting: Family Medicine

## 2014-07-06 NOTE — Assessment & Plan Note (Signed)
Bilateral CSI  Long discussion about natural progression of osteoarthritis in setting of obesity, risk benefit of continued corticosteroid injection. Greater than 50% of our 40 minute office visit was spent in counseling and education regarding these issues.

## 2014-07-06 NOTE — Progress Notes (Signed)
   Subjective:    Patient ID: Amanda Santiago, female    DOB: Apr 06, 1965, 49 y.o.   MRN: [id number]  HPI Interpreter is Amanda [place] Patient is here for follow-up of bilateral knee pain, left ankle pain. At last office visit we made her some orthotics which we hoped would improve her ankle pain and potentially give her some improvement in her knee pain. She has she thinks orthotics made her feet hurt. She's not sure if they help her ankle pain or not because she doesn't remember having ankle pain. Knees improve for about 6 weeks and then she started having pain again. Over the last 2 weeks it's been quite significant. Limits her from activities, keeps her from sleeping.  Review of Systems No unusual weight change, no fever, sweats, chills. Knee pain as per history of present illness reviewed she's noted no knee swelling or erythema . Denies calf pain.    Objective:   Physical Exam Vital signs are reviewed, slightly elevated systolic blood pressure GEN.: Well-developed obese female no acute distress KNEES: She has full range of motion flexion extension. Ligamentously intact to varus and valgus stress. Medial joint line tenderness bilaterally on the left knee she has a little bit of lateral joint line tenderness. Mild crepitus on extension on the left knee. No effusion is noted. SKIN: Skin around the knee area is without lesion, no erythema, no warmth, no rash. ANKLE: Either ankle is tender to palpation. There is no swelling. She has full range of motion flexion extension. Anterior drawer is symmetrical with good end point. EXTREMITY: The calves are soft bilaterally. VASC:: Dorsalis pedis pulses and posterior tibial pulses are 2+ bilaterally equal.  INJECTION: Patient was given informed consent, signed copy in the chart. Appropriate time out was taken. Area prepped and draped in usual sterile fashion. 1 cc of methylprednisolone 40 mg/ml plus  4 cc of 1% lidocaine without epinephrine was  injected into the bilateral knees using a(n) anterior medial approach. The patient tolerated the procedure well. There were no complications. Post procedure instructions were given.        Assessment & Plan:

## 2014-07-06 NOTE — Assessment & Plan Note (Addendum)
We made her orthotics which she wore up until 2 weeks ago. She says they were starting  to hurt her feet. On discussion today she doesn't recall really having any  ankle pain, so it seems like it significantly improved. Doesn't sound like she wants to continue to use the orthotic so I hope the ankle does not regress.

## 2014-07-08 ENCOUNTER — Ambulatory Visit: Payer: No Typology Code available for payment source | Admitting: Internal Medicine

## 2014-07-22 ENCOUNTER — Ambulatory Visit: Payer: No Typology Code available for payment source | Admitting: Internal Medicine

## 2014-08-10 ENCOUNTER — Ambulatory Visit: Payer: No Typology Code available for payment source | Admitting: Internal Medicine

## 2015-04-04 ENCOUNTER — Ambulatory Visit (HOSPITAL_BASED_OUTPATIENT_CLINIC_OR_DEPARTMENT_OTHER): Payer: Self-pay | Admitting: Internal Medicine

## 2015-04-04 ENCOUNTER — Ambulatory Visit: Payer: Self-pay | Attending: Internal Medicine

## 2015-04-04 ENCOUNTER — Encounter: Payer: Self-pay | Admitting: Internal Medicine

## 2015-04-04 VITALS — BP 154/90 | HR 82 | Temp 98.0°F | Resp 16 | Ht 64.0 in | Wt 336.0 lb

## 2015-04-04 DIAGNOSIS — M170 Bilateral primary osteoarthritis of knee: Secondary | ICD-10-CM

## 2015-04-04 DIAGNOSIS — J309 Allergic rhinitis, unspecified: Secondary | ICD-10-CM

## 2015-04-04 DIAGNOSIS — Z79899 Other long term (current) drug therapy: Secondary | ICD-10-CM | POA: Insufficient documentation

## 2015-04-04 DIAGNOSIS — E119 Type 2 diabetes mellitus without complications: Secondary | ICD-10-CM

## 2015-04-04 DIAGNOSIS — R51 Headache: Secondary | ICD-10-CM | POA: Insufficient documentation

## 2015-04-04 DIAGNOSIS — S0591XA Unspecified injury of right eye and orbit, initial encounter: Secondary | ICD-10-CM

## 2015-04-04 DIAGNOSIS — Z7984 Long term (current) use of oral hypoglycemic drugs: Secondary | ICD-10-CM | POA: Insufficient documentation

## 2015-04-04 LAB — POCT GLYCOSYLATED HEMOGLOBIN (HGB A1C): HEMOGLOBIN A1C: 7.8

## 2015-04-04 LAB — GLUCOSE, POCT (MANUAL RESULT ENTRY): POC Glucose: 290 mg/dl — AB (ref 70–99)

## 2015-04-04 MED ORDER — FLUTICASONE PROPIONATE 50 MCG/ACT NA SUSP: 2.0000 | Freq: Every day | NASAL | Status: DC

## 2015-04-04 MED ORDER — METFORMIN HCL ER 500 MG PO TB24: ORAL_TABLET | ORAL | Status: DC

## 2015-04-04 MED FILL — METFORMIN HCL ER 500 MG TAB: 500 | 30 days supply | Qty: 30 | Fill #0

## 2015-04-04 MED FILL — FLUTICASONE PROP 50 MCG SPR: 50 | 30 days supply | Qty: 16 | Fill #0

## 2015-04-04 NOTE — Progress Notes (Signed)
Patient ID: Amanda Santiago, female   DOB: 07/07/1965, 50 y.o.   MRN: [id number] SUBJECTIVE: 50 y.o. female for follow up of diabetes. Diabetic Review of Systems - medication compliance: noncompliant much of the time, states that she ran out of medication for several months and then was able to get some from [place] that she takes twice weekly, diabetic diet compliance: noncompliant much of the time, home glucose monitoring: is not performed, further diabetic ROS: no polyuria or polydipsia, no chest pain, dyspnea or TIA's, no numbness, tingling or pain in extremities, no unusual visual symptoms, no hypoglycemia.  Other symptoms and concerns: Patient reports that she has been having rhinitis, chills, headaches for 2 days. She has not tried anything for symptom management.  Patient is concerned because she was cooking breakfast earlier today and reports a piece of hot meat flew into her right eye. She attempted to wash her eye and felt something come out of the eye. She now reports that the eye is burning.  She is requesting referral back to Sports Medicine for progressively worse knee pain. Current Outpatient Prescriptions  Medication Sig Dispense Refill  . albuterol (PROVENTIL HFA;VENTOLIN HFA) 108 (90 BASE) MCG/ACT inhaler Inhale 2 puffs into the lungs every 6 (six) hours as needed for wheezing or shortness of breath. 1 Inhaler 0  . clotrimazole (LOTRIMIN) 1 % cream Apply 1 application topically 2 (two) times daily. (Patient not taking: Reported on 03/08/2014) 30 g 0  . lactobacillus acidophilus & bulgar (LACTINEX) chewable tablet Chew 1 tablet by mouth 3 (three) times daily with meals. (Patient not taking: Reported on 03/08/2014) 90 tablet 0  . metFORMIN (GLUCOPHAGE-XR) 500 MG 24 hr tablet TAKE 1 TABLET BY MOUTH ONCE DAILY WITH BREAKFAST (Patient not taking: Reported on 06/21/2014) 30 tablet 3  . naproxen (NAPROSYN) 500 MG tablet Take 1 tablet (500 mg total) by mouth 2 (two) times daily as needed for  mild pain, moderate pain or headache (TAKE WITH MEALS.). 20 tablet 0  . nystatin (MYCOSTATIN/NYSTOP) 100000 UNIT/GM POWD Use twice daily in skin folds (Patient not taking: Reported on 03/08/2014) 30 g 2  . Specialty Vitamins Products (WEIGHT LOSS DAILY MULTI) TABS Take 1 tablet by mouth 4 (four) times daily.     No current facility-administered medications for this visit.  Review of systems: Other than what is stated in HPI, all other systems are negative.   OBJECTIVE: Appearance: alert, well appearing, and in no distress, oriented to person, place, and time and morbidly obese.. BP 154/90 mmHg  Pulse 82  Temp(Src) 98 F (36.7 C)  Resp 16  Ht 5\' 4"  (1.626 m)  Wt 336 lb (152.409 kg)  BMI 57.65 kg/m2  SpO2 100%  Physical Exam  Constitutional: She is oriented to person, place, and time.  Eyes: EOM are normal. Pupils are equal, round, and reactive to light. Right eye exhibits no discharge. Left eye exhibits no discharge.  No debris found in eye  Cardiovascular: Normal rate, regular rhythm and normal heart sounds.   Pulmonary/Chest: Effort normal.  Musculoskeletal: Normal range of motion. She exhibits no edema or tenderness.  Neurological: She is alert and oriented to person, place, and time.    ASSESSMENT: Amanda Santiago was seen today for follow-up.  Diagnoses and all orders for this visit:  Type 2 diabetes mellitus without complication, without long-term current use of insulin (HCC) -     Glucose (CBG) -     HgB A1c -     metFORMIN (GLUCOPHAGE-XR) 500 MG 24 hr  tablet; TAKE 1 TABLET BY MOUTH ONCE DAILY WITH BREAKFAST A1C has increased due to patient not taking medication as directed. I have highly advised her to start back on medication daily and focus on weight loss, exercise, and diet.   Morbid obesity, unspecified obesity type ([place]) Stressed weight loss and how being severely overweight causes more pressure on the joints. Stressed that if she was to lose a significant amount of  weight then she may eventually be able to come off Metformin.   Bilateral knee osteoarthritis  -     Ambulatory referral to Sports Medicine  Allergic rhinitis, unspecified allergic rhinitis type -     fluticasone (FLONASE) 50 MCG/ACT nasal spray; Place 2 sprays into both nostrils daily.  Eye injuries, right, initial encounter I have stressed that patient make appointment with ophthalmology to make sure she does not have further damage of the eye. May use saline drops until seen by ophthalmology.    PLAN: See orders for this visit as documented in the electronic medical record. Issues reviewed with her: low cholesterol diet, weight control and daily exercise discussed, home glucose monitoring emphasized, foot care discussed and Podiatry visits discussed, annual eye examinations at Ophthalmology discussed and long term diabetic complications discussed.  Due to language barrier, an interpreter was present during the history-taking and subsequent discussion (and for part of the physical exam) with this patient.  Return in about 3 months (around 07/03/2015) for Diabetes Mellitus.  Lance Bosch, NP 04/04/2015 9:05 PM

## 2015-04-04 NOTE — Progress Notes (Signed)
Patient here for follow up Complains of runny nose, fever and headache for the past couple of days Also states when she was cooking this am a piece of hot meat splashed up and hit her right eye Patient also requesting a referral to ortho for her arthritis

## 2015-04-15 ENCOUNTER — Ambulatory Visit (INDEPENDENT_AMBULATORY_CARE_PROVIDER_SITE_OTHER): Payer: No Typology Code available for payment source | Admitting: Family Medicine

## 2015-04-15 ENCOUNTER — Encounter: Payer: Self-pay | Admitting: Family Medicine

## 2015-04-15 VITALS — BP 151/79 | Ht 66.0 in | Wt 330.0 lb

## 2015-04-15 DIAGNOSIS — M170 Bilateral primary osteoarthritis of knee: Secondary | ICD-10-CM

## 2015-04-15 MED ORDER — METHYLPREDNISOLONE ACETATE 40 MG/ML IJ SUSP
40.0000 mg | Freq: Once | INTRAMUSCULAR | Status: AC
  Administered 2015-04-15: 40 mg via INTRA_ARTICULAR

## 2015-04-15 NOTE — Addendum Note (Signed)
Addended by: Cyd Silence on: 04/15/2015 10:50 AM   Modules accepted: Orders

## 2015-04-15 NOTE — Patient Instructions (Signed)
[facility] Marlou Sa Monday 04/18/15 at 815am [address] [place], [place] [address] Phone: [phone]

## 2015-04-15 NOTE — Addendum Note (Signed)
Addended by: Cyd Silence on: 04/15/2015 10:16 AM   Modules accepted: Orders

## 2015-04-15 NOTE — Progress Notes (Signed)
   Subjective:    Patient ID: Amanda Santiago, female    DOB: Feb 28, 1966, 50 y.o.   MRN: [id number]  HPI   Patient is here for follow-up of bilateral knee pain. Dx with B/L Primary localized OA of both knees about one year ago with x-rays showing tricompartmental OA, mild-moderate.  Did have previous corticosteroid injections performed 07/06/2014 with about 1-2 months of relief.  Denies locking, catching, injury, or instability/giving way at this time.  Pain is becoming more constant however, especially with walking and is interested in discussing with surgeon.    Review of Systems No unusual weight change, no fever, sweats, chills. Knee pain as per history of present illness reviewed she's noted no knee swelling or erythema . Denies calf pain.    Objective:   Physical Exam Vital signs are reviewed, slightly elevated systolic blood pressure GEN.: Well-developed obese female no acute distress KNEES: She has full range of motion flexion extension. Ligamentously intact to varus and valgus stress. Medial joint line tenderness bilaterally.  Mild crepitus on extension on the left knee. No effusion is noted. SKIN: Skin around the knee area is without lesion, no erythema, no warmth, no rash. EXTREMITY: The calves are soft bilaterally. VASC:: Dorsalis pedis pulses and posterior tibial pulses are 2+ bilaterally equal.  B/L knee 3 view 03/31/2014 - Mild-moderate tricompartmental OA L>R.

## 2015-04-15 NOTE — Assessment & Plan Note (Signed)
B/L Knee injections today.  Will refer to ortho for discussion for TKA.    Aspiration/Injection Procedure Note Amanda Santiago 04-02-1965  Procedure: Injection Indications: B/L Knee OA   Procedure Details Consent: Risks of procedure as well as the alternatives and risks of each were explained to the (patient/caregiver).  Consent for procedure obtained. Time Out: Verified patient identification, verified procedure, site/side was marked, verified correct patient position, special equipment/implants available, medications/allergies/relevent history reviewed, required imaging and test results available.  Performed.  The area was cleaned with iodine and alcohol swabs.    The L and R knee were injected using 2 cc's of 40mg  Depomedrol and 4 cc's of 1% lidocaine with a 21 1 1/2" needle.   A sterile dressing was applied.  Patient did tolerate procedure well. Estimated blood loss: None

## 2015-05-02 MED FILL — METFORMIN HCL ER 500 MG TAB: 500 | 30 days supply | Qty: 30 | Fill #1

## 2015-06-02 MED FILL — METFORMIN HCL ER 500 MG TAB: 500 | 30 days supply | Qty: 30 | Fill #2

## 2015-06-06 ENCOUNTER — Encounter: Payer: Self-pay | Admitting: Internal Medicine

## 2015-06-06 ENCOUNTER — Ambulatory Visit: Payer: No Typology Code available for payment source | Attending: Internal Medicine | Admitting: Internal Medicine

## 2015-06-06 VITALS — BP 147/86 | HR 74 | Temp 97.3°F | Resp 16 | Ht 64.0 in | Wt 320.0 lb

## 2015-06-06 DIAGNOSIS — K0889 Other specified disorders of teeth and supporting structures: Secondary | ICD-10-CM

## 2015-06-06 DIAGNOSIS — Z87891 Personal history of nicotine dependence: Secondary | ICD-10-CM | POA: Insufficient documentation

## 2015-06-06 DIAGNOSIS — Z87442 Personal history of urinary calculi: Secondary | ICD-10-CM | POA: Insufficient documentation

## 2015-06-06 DIAGNOSIS — Z7984 Long term (current) use of oral hypoglycemic drugs: Secondary | ICD-10-CM | POA: Insufficient documentation

## 2015-06-06 DIAGNOSIS — Z79899 Other long term (current) drug therapy: Secondary | ICD-10-CM | POA: Insufficient documentation

## 2015-06-06 DIAGNOSIS — Z888 Allergy status to other drugs, medicaments and biological substances status: Secondary | ICD-10-CM | POA: Insufficient documentation

## 2015-06-06 DIAGNOSIS — N644 Mastodynia: Secondary | ICD-10-CM | POA: Insufficient documentation

## 2015-06-06 DIAGNOSIS — E669 Obesity, unspecified: Secondary | ICD-10-CM | POA: Insufficient documentation

## 2015-06-06 DIAGNOSIS — E119 Type 2 diabetes mellitus without complications: Secondary | ICD-10-CM

## 2015-06-06 DIAGNOSIS — M1990 Unspecified osteoarthritis, unspecified site: Secondary | ICD-10-CM | POA: Insufficient documentation

## 2015-06-06 LAB — GLUCOSE, POCT (MANUAL RESULT ENTRY): POC Glucose: 183 mg/dl — AB (ref 70–99)

## 2015-06-06 NOTE — Progress Notes (Signed)
Patient c/o breast pain x 1.5 mo.   Patient reports having spots and dryness of the left breast. Painful to the touch.  Patient has a mark on right forearm she pulled it off leaving a dark mark.   Patient requesting Dental and Opthalmology referral.

## 2015-06-06 NOTE — Progress Notes (Signed)
Patient ID: Amanda Santiago, female   DOB: 1965-04-22, 50 y.o.   MRN: [id number]  CC: breast pain  HPI: Amanda Santiago is a 50 y.o. female here today for a follow up visit.  Patient has past medical history of diabetes in obesity. Patient reports that she has been having some dental pain on the top right for the past few weeks. She has been using sensodyne toothpaste that has helped slightly. No swelling or bleeding from gums. Bilateral breast pain for the past 1.5 months. She states that she has not had a period in over one year. Pain is worse on the right and she has noticed some dry skin patches on the breast as well. Last mammogram was in 2015.   Allergies  Allergen Reactions  . Aspirin Swelling  . Morphine And Related Swelling  . Shellfish-Derived Products Swelling   Past Medical History  Diagnosis Date  . Arthritis   . Kidney stone 9-10 years ago  . Diabetes mellitus without complication ([place])     takes glucaphage   Current Outpatient Prescriptions on File Prior to Visit  Medication Sig Dispense Refill  . metFORMIN (GLUCOPHAGE-XR) 500 MG 24 hr tablet TAKE 1 TABLET BY MOUTH ONCE DAILY WITH BREAKFAST 30 tablet 3  . albuterol (PROVENTIL HFA;VENTOLIN HFA) 108 (90 BASE) MCG/ACT inhaler Inhale 2 puffs into the lungs every 6 (six) hours as needed for wheezing or shortness of breath. (Patient not taking: Reported on 06/06/2015) 1 Inhaler 0  . clotrimazole (LOTRIMIN) 1 % cream Apply 1 application topically 2 (two) times daily. (Patient not taking: Reported on 03/08/2014) 30 g 0  . fluticasone (FLONASE) 50 MCG/ACT nasal spray Place 2 sprays into both nostrils daily. (Patient not taking: Reported on 06/06/2015) 16 g 1  . lactobacillus acidophilus & bulgar (LACTINEX) chewable tablet Chew 1 tablet by mouth 3 (three) times daily with meals. (Patient not taking: Reported on 03/08/2014) 90 tablet 0  . naproxen (NAPROSYN) 500 MG tablet Take 1 tablet (500 mg total) by mouth 2 (two) times  daily as needed for mild pain, moderate pain or headache (TAKE WITH MEALS.). (Patient not taking: Reported on 06/06/2015) 20 tablet 0  . nystatin (MYCOSTATIN/NYSTOP) 100000 UNIT/GM POWD Use twice daily in skin folds (Patient not taking: Reported on 03/08/2014) 30 g 2  . Specialty Vitamins Products (WEIGHT LOSS DAILY MULTI) TABS Take 1 tablet by mouth 4 (four) times daily. Reported on 06/06/2015     No current facility-administered medications on file prior to visit.   Family History  Problem Relation Age of Onset  . Diabetes Mother   . Hyperlipidemia Mother   . Hypertension Mother    Social History   Social History  . Marital Status: Married    Spouse Name: N/A  . Number of Children: N/A  . Years of Education: N/A   Occupational History  . Not on file.   Social History Main Topics  . Smoking status: Former Smoker -- 1.50 packs/day for 18 years    Types: Cigarettes  . Smokeless tobacco: Former Systems developer    Quit date: 03/12/1997  . Alcohol Use: No  . Drug Use: No  . Sexual Activity: Not on file   Other Topics Concern  . Not on file   Social History Narrative    Review of Systems: Other than what is stated in HPI, all other systems are negative.   Objective:   Filed Vitals:   06/06/15 0902  BP: 147/86  Pulse: 74  Temp: 97.3 F (36.3 C)  Resp: 16    Physical Exam  Constitutional: She is oriented to person, place, and time.  HENT:  Mouth/Throat: Dental caries present.  Cardiovascular: Normal rate, regular rhythm and normal heart sounds.   Pulmonary/Chest: Effort normal and breath sounds normal. Right breast exhibits no mass. Left breast exhibits no mass.    Genitourinary: There is breast tenderness.  Neurological: She is alert and oriented to person, place, and time.  Skin:  Skin moles above nipple on right breast     Lab Results  Component Value Date   WBC 11.7* 12/23/2013   HGB 17.0* 06/15/2014   HCT 50.0* 06/15/2014   MCV 87.8 12/23/2013   PLT 252  12/23/2013   Lab Results  Component Value Date   CREATININE 0.50 06/15/2014   BUN 17 06/15/2014   NA 139 06/15/2014   K 4.1 06/15/2014   CL 99 06/15/2014   CO2 25 12/23/2013    Lab Results  Component Value Date   HGBA1C 7.8 04/04/2015   Lipid Panel     Component Value Date/Time   CHOL 88 09/14/2013 1012   TRIG 116 09/14/2013 1012   HDL 41 09/14/2013 1012   CHOLHDL 2.1 09/14/2013 1012   VLDL 23 09/14/2013 1012   LDLCALC 24 09/14/2013 1012       Assessment and plan:   Amanda Santiago was seen today for breast pain.  Diagnoses and all orders for this visit:  Breast pain -     MM DIAG BREAST TOMO BILATERAL; Future -     US BREAST LTD UNI LEFT INC AXILLA; Future -     US BREAST LTD UNI RIGHT INC AXILLA; Future  Pain, dental -     Ambulatory referral to Dentistry  Diabetes mellitus without complication (HCC) -     Glucose (CBG)  stable, will follow up in 2 months   Interpreter was used to communicate directly with patient for the entire encounter including providing detailed patient instructions.   Return in about 2 months (around 08/06/2015) for Diabetes Mellitus.       Lance Bosch, [facility] [phone] 06/06/2015, 9:17 AM

## 2015-06-07 ENCOUNTER — Encounter: Payer: Self-pay | Admitting: Clinical

## 2015-06-07 NOTE — Progress Notes (Signed)
Depression screen [facility] 2/9 06/06/2015 06/06/2015 04/15/2015 06/21/2014 01/15/2014  Decreased Interest 0 0 0 1 0  Down, Depressed, Hopeless 2 0 0 1 0  PHQ - 2 Score 2 0 0 2 0  Altered sleeping 3 - - 1 -  Tired, decreased energy 2 - - 1 -  Change in appetite 0 - - 1 -  Feeling bad or failure about yourself  2 - - 1 -  Trouble concentrating 0 - - 1 -  Moving slowly or fidgety/restless 0 - - 0 -  Suicidal thoughts 1 - - 0 -  PHQ-9 Score 10 - - 7 -    GAD 7 : Generalized Anxiety Score 06/06/2015  Nervous, Anxious, on Edge 1  Control/stop worrying 0  Worry too much - different things 2  Trouble relaxing 0  Restless 0  Easily annoyed or irritable 1  Afraid - awful might happen 0  Total GAD 7 Score 4

## 2015-06-08 ENCOUNTER — Other Ambulatory Visit (HOSPITAL_COMMUNITY): Payer: Self-pay | Admitting: *Deleted

## 2015-06-08 DIAGNOSIS — N6320 Unspecified lump in the left breast, unspecified quadrant: Secondary | ICD-10-CM

## 2015-06-08 DIAGNOSIS — R2231 Localized swelling, mass and lump, right upper limb: Secondary | ICD-10-CM

## 2015-06-08 DIAGNOSIS — N644 Mastodynia: Secondary | ICD-10-CM

## 2015-06-15 ENCOUNTER — Other Ambulatory Visit (HOSPITAL_COMMUNITY): Payer: Self-pay | Admitting: *Deleted

## 2015-06-15 DIAGNOSIS — N898 Other specified noninflammatory disorders of vagina: Secondary | ICD-10-CM

## 2015-06-16 ENCOUNTER — Ambulatory Visit
Admission: RE | Admit: 2015-06-16 | Discharge: 2015-06-16 | Disposition: A | Discharge status: Home / Self Care | Payer: No Typology Code available for payment source | Source: Ambulatory Visit | Attending: Internal Medicine | Admitting: Internal Medicine

## 2015-06-16 ENCOUNTER — Ambulatory Visit (HOSPITAL_COMMUNITY)
Admission: RE | Admit: 2015-06-16 | Discharge: 2015-06-16 | Disposition: A | Discharge status: Home / Self Care | Payer: Self-pay | Source: Ambulatory Visit | Attending: Obstetrics and Gynecology | Admitting: Obstetrics and Gynecology

## 2015-06-16 ENCOUNTER — Ambulatory Visit
Admission: RE | Admit: 2015-06-16 | Discharge: 2015-06-16 | Disposition: A | Discharge status: Home / Self Care | Payer: No Typology Code available for payment source | Source: Ambulatory Visit | Attending: Obstetrics and Gynecology | Admitting: Obstetrics and Gynecology

## 2015-06-16 ENCOUNTER — Encounter (HOSPITAL_COMMUNITY): Payer: Self-pay

## 2015-06-16 VITALS — BP 120/84 | Temp 98.1°F | Ht 64.0 in | Wt 319.8 lb

## 2015-06-16 DIAGNOSIS — N6320 Unspecified lump in the left breast, unspecified quadrant: Secondary | ICD-10-CM

## 2015-06-16 DIAGNOSIS — N644 Mastodynia: Secondary | ICD-10-CM

## 2015-06-16 DIAGNOSIS — R2231 Localized swelling, mass and lump, right upper limb: Secondary | ICD-10-CM

## 2015-06-16 DIAGNOSIS — Z1239 Encounter for other screening for malignant neoplasm of breast: Secondary | ICD-10-CM

## 2015-06-16 NOTE — Progress Notes (Signed)
Complaints of a constant left breast pain x 5-6 months. Patient rates pain at a 2-3 out of 10.  Pap Smear: Pap smear not completed today. Last Pap smear was 10/12/2013 at [facility] and normal with negative HPV. Last Pap smear result is in EPIC.  Physical exam: Breasts Breasts symmetrical. No skin abnormalities bilateral breasts. No nipple retraction bilateral breasts. No nipple discharge bilateral breasts. No lymphadenopathy. No lumps palpated bilateral breasts. Complaints of left breast tenderness under nipple area on exam. Referred patient to the [facility] for diagnostic mammogram. Appointment scheduled for Thursday, June 16, 2015 at 0930.       Pelvic/Bimanual No Pap smear completed today since last Pap smear and HPV typing was 10/12/2013. Pap smear not indicated per BCCCP guidelines.   Smoking History: Patient is a former smoker that quit 03/12/1997.  Patient Navigation: Patient education provided. Access to services provided for patient through [facility] program. Spanish interpreter provided. Referred patient to the [facility] program.  Colorectal Cancer Screening: Patient has never had a colonoscopy. No complaints today.   Used Spanish interpreter [employer] from [place].

## 2015-06-16 NOTE — Patient Instructions (Addendum)
Educational materials on self breast awareness given. Explained to Amanda Santiago that she did not need a Pap smear today due to last Pap smear was in 2015 per patient. Let her know BCCCP will cover Pap smears every 3 years or Pap smears and HPV typing every 5 years unless has a history of an abnormal Pap smear. Referred patient to the [facility] for diagnostic mammogram. Appointment scheduled for Thursday, June 16, 2015 at 0930. Amanda Santiago verbalized understanding.  Titus Drone, Arvil [place], RN 10:28 AM

## 2015-06-16 NOTE — Addendum Note (Signed)
Encounter addended by: Loletta Parish, RN on: 06/16/2015 12:18 PM<BR>     Documentation filed: Patient Instructions Section

## 2015-06-21 ENCOUNTER — Encounter (HOSPITAL_COMMUNITY): Payer: Self-pay | Admitting: *Deleted

## 2015-06-21 ENCOUNTER — Telehealth: Payer: Self-pay

## 2015-06-21 NOTE — Telephone Encounter (Signed)
-----   Message from Lance Bosch, NP sent at 06/17/2015 10:19 AM EDT ----- Breast cyst of left that is non cancerous. Lipoma or fat pocket in right armpit. We can continue to monitor the fat pocket---usually no treatment is needed unless it becomes sore or grows larger. Then surgery may be needed to remove area. Will need repeat mammogram in 1 year

## 2015-06-21 NOTE — Telephone Encounter (Signed)
Spoke with [facility] [phone]. Interpreter verified name and DOB. Patient was given lab results, given f/up instruction if sxs presist, and to return in a year for annual mammogram. Patient verbalized she understood with no further questions.

## 2015-06-24 ENCOUNTER — Other Ambulatory Visit: Payer: Self-pay

## 2015-06-24 ENCOUNTER — Ambulatory Visit: Payer: Self-pay

## 2015-06-24 VITALS — BP 136/75 | HR 78 | Temp 97.8°F | Resp 26 | Ht 67.0 in | Wt 320.0 lb

## 2015-06-24 DIAGNOSIS — Z0000 Encounter for general adult medical examination without abnormal findings: Secondary | ICD-10-CM

## 2015-06-24 NOTE — Patient Instructions (Signed)
Discussed health assessment with patient.She will be called with results of lab work and we will then discussed any further follow up the patient needs. Patient verbalized understanding. 

## 2015-06-24 NOTE — Progress Notes (Signed)
Patient is a new patient to the [facility] program and is currently a BCCCP patient effective 06/16/2015.   Clinical Measurements: Patient is 5 ft. 7 inches, weight 320 lbs, and BMI 50.2.   Medical History: Patient has no history of high cholesterol or hypertension.  Patient does have a history of  Diabetes and takes metformin as ordered. Per patient no diagnosed history of coronary heart disease, heart attack, heart failure, stroke/TIA, vascular disease or congenital heart defects.   Blood Pressure, Self-measurement: Patient states has no reason to check Blood pressure.  Nutrition Assessment: Patient stated that eats 1 to 2 fruits every day. Patient states she eats 1 servings of vegetables a day. Per patient does eat 3 or more ounces of whole grains daily. Patient doesn't eat two or more servings of fish weekly. Patient states she does not drink more than 36 ounces or 450 calories of beverages with added sugars weekly. Patient states she drinks a lot of water. Patient stated she does  watch her salt intake.   Physical Activity Assessment: Patient stated she is unable to do any moderate or vigorous exercise. Per patient she can barely get around and has pain all the time in knees.  Smoking Status: Patient stated that stopped smoking 17 years ago and is not exposed to smoke.  Quality of Life Assessment: In assessing patient's physical quality of life she stated that out of the past 30 days that she has felt her health was not good for all days. Patient also stated that in the past 30 days that her mental health was not good including stress, depression and problems with emotions for all days. Patient did state that out of the past 30 days she felt her physical or mental health had  kept her from doing her usual activities including self-care, work or recreation for all days.   Plan: Lab work will be done today including a lipid panel and Hgb A1C. Will call lab results when they are finished. Patient  will receive Health Coaching for risk reduction initially and then as patient wants.

## 2015-06-25 LAB — LIPID PANEL
CHOLESTEROL TOTAL: 136 mg/dL (ref 100–199)
Chol/HDL Ratio: 2.6 ratio units (ref 0.0–4.4)
HDL: 53 mg/dL (ref >39)
LDL Calculated: 51 mg/dL (ref 0–99)
TRIGLYCERIDES: 162 mg/dL — AB (ref 0–149)
VLDL CHOLESTEROL CAL: 32 mg/dL (ref 5–40)

## 2015-06-25 LAB — HEMOGLOBIN A1C
ESTIMATED AVERAGE GLUCOSE: 197 mg/dL
Hemoglobin A1c: 8.5 % — ABNORMAL HIGH (ref 4.8–5.6)

## 2015-07-07 MED FILL — METFORMIN HCL ER 500 MG TAB: 500 | 30 days supply | Qty: 30 | Fill #3

## 2015-07-08 ENCOUNTER — Telehealth: Payer: Self-pay

## 2015-07-08 NOTE — Telephone Encounter (Signed)
LAB RESULTS  Called to inform about lab work from 06/24/15. Interpreter Lavon Paganini informed patient: BMI 50.11, cholesterol- 136, HDL- 53, LDL- 51, triglycerides - 162, and HBG-A1C - 8.5.   RISK REDUCTION COUNSELING WITH HEALTH COACHING:Did risk reduction Health Coaching concerning BMI. Informed that normal BMI was 18 to 25 and patient is in obese range. Patient is aware  Discussed the need to loose weight, decrease sugar in diet and starches with examples given.Explained that her diabetes was not well controlled and needed to look closer at what she could do or is willing to do to control blood sugar. Will discuss barriers, cultural concerns and past history when do one on one health coaching. Asdked patient to write down every thing that eats for a week and bring list in on May 12 th.   PLAN: Scheduled one on one Health Coaching for May 12th at 9 AM. Will develop materials for patient  TIME SPENT WITH PATIENT: 15 minutes.Marland Kitchen

## 2015-07-19 ENCOUNTER — Encounter: Payer: Self-pay | Admitting: Family Medicine

## 2015-07-19 ENCOUNTER — Ambulatory Visit (INDEPENDENT_AMBULATORY_CARE_PROVIDER_SITE_OTHER): Payer: Self-pay | Admitting: Family Medicine

## 2015-07-19 VITALS — BP 149/85 | HR 64 | Ht 67.0 in | Wt 310.0 lb

## 2015-07-19 DIAGNOSIS — M25572 Pain in left ankle and joints of left foot: Secondary | ICD-10-CM

## 2015-07-19 DIAGNOSIS — M170 Bilateral primary osteoarthritis of knee: Secondary | ICD-10-CM

## 2015-07-19 MED ORDER — METHYLPREDNISOLONE ACETATE 40 MG/ML IJ SUSP
40.0000 mg | Freq: Once | INTRAMUSCULAR | Status: AC
  Administered 2015-07-19: 40 mg via INTRA_ARTICULAR

## 2015-07-19 NOTE — Assessment & Plan Note (Addendum)
B/L Knee injections today.Will f/u with othro for TKA on June 6th, 2016.   Aspiration/Injection Procedure Note Amanda Santiago [birth year]  Procedure: Injection Indications: B/L Knee OA   Procedure Details Consent: Risks of procedure as well as the alternatives and risks of each were explained to the (patient/caregiver). Consent for procedure obtained. Time Out: Verified patient identification, verified procedure, site/side was marked, verified correct patient position, special equipment/implants available, medications/allergies/relevent history reviewed, required imaging and test results available. Performed. The area was cleaned with betadine.   The L and R knee were injected using 2 cc's of 40mg  Depomedrol and 4 cc's of 1% lidocaine with a 21 1 1/2" needle via anteromedial approach.   A sterile dressing was applied.  Patient did tolerate procedure well. Estimated blood loss: None

## 2015-07-19 NOTE — Progress Notes (Signed)
Subjective: CC: f/u knee HPI: Patient is a 50 y.o. female  presenting to clinic today for a f/u on B/L knee pain thought to be secondary to OA (x-rays showing tricompartmental OA, mild-moderate approximately 62yr ago) .  She's had knee injections on 07/06/14 and 04/15/15. The pain never completely resolved after the last injections. She's had an improvement that lasted approximately 1 month. She now endorses excruciating pain. She last took diclofenac 4 days, but it didn't help too much. Yesterday she noted both her knees popping. There is pain with popping. She saw the surgeon and felt surgery was not a great option with her currently weight and DM. She has a f/u appt with them on June 6th.  She's lost 26lbs since her last visit with the surgeon.   She started having left ankle pain several months ago but now it is much stronger. Pain is located posteriorly and does not radiate. Pain is present constantly but worsens with ambulation. Now having left lumbar pain as well.    ROS: All other systems reviewed and are negative.  Past Medical History Patient Active Problem List   Diagnosis Date Noted  . Diabetes mellitus without complication ([place]) XX123456  . Sternal pain 06/21/2014  . Morbid obesity ([place]) 04/06/2014  . Pain in joint, ankle and foot 04/06/2014  . DJD (degenerative joint disease) of knee 04/06/2014  . Knee pain, bilateral 01/15/2014    Medications- reviewed and updated Current Outpatient Prescriptions  Medication Sig Dispense Refill  . albuterol (PROVENTIL HFA;VENTOLIN HFA) 108 (90 BASE) MCG/ACT inhaler Inhale 2 puffs into the lungs every 6 (six) hours as needed for wheezing or shortness of breath. (Patient not taking: Reported on 06/06/2015) 1 Inhaler 0  . clotrimazole (LOTRIMIN) 1 % cream Apply 1 application topically 2 (two) times daily. (Patient not taking: Reported on 03/08/2014) 30 g 0  . fluticasone (FLONASE) 50 MCG/ACT nasal spray Place 2 sprays into both nostrils  daily. (Patient not taking: Reported on 06/06/2015) 16 g 1  . lactobacillus acidophilus & bulgar (LACTINEX) chewable tablet Chew 1 tablet by mouth 3 (three) times daily with meals. (Patient not taking: Reported on 03/08/2014) 90 tablet 0  . metFORMIN (GLUCOPHAGE-XR) 500 MG 24 hr tablet TAKE 1 TABLET BY MOUTH ONCE DAILY WITH BREAKFAST 30 tablet 3  . naproxen (NAPROSYN) 500 MG tablet Take 1 tablet (500 mg total) by mouth 2 (two) times daily as needed for mild pain, moderate pain or headache (TAKE WITH MEALS.). (Patient not taking: Reported on 06/06/2015) 20 tablet 0  . nystatin (MYCOSTATIN/NYSTOP) 100000 UNIT/GM POWD Use twice daily in skin folds (Patient not taking: Reported on 03/08/2014) 30 g 2  . Specialty Vitamins Products (WEIGHT LOSS DAILY MULTI) TABS Take 1 tablet by mouth 4 (four) times daily. Reported on 06/16/2015     No current facility-administered medications for this visit.    Objective: Office vital signs reviewed. BP 149/85 mmHg  Pulse 64  Ht 5\' 7"  (1.702 m)  Wt 310 lb (140.615 kg)  BMI 48.54 kg/m2   Physical Examination:  Obese. No NAD.  KNEES: No effusions noted. She has full range of motion in flexion and extension. Solids ACL, PCL, MCL, and LCL endpoints. Medial joint line tenderness bilaterally.Lateral joint line tenderness on the right knee.  Mild crepitus on extension bilaterally. Strength 5/5. Neurovascularly intact distally.   Left ankle: no swelling/effusions, ecchymoses noted. Tender posterior to the lateral malleolus. Plantar flexion and dorsiflexion intact.   MS[facility] ultrasound of left ankle: left peroneal  inflammation without significant fluid accumulation or rupture.   Assessment/Plan: DJD (degenerative joint disease) of knee B/L Knee injections today.Will f/u with othro for TKA on June 6th, 2016.   Aspiration/Injection Procedure Note Courteny Macho 03-Sep-1965  Procedure: Injection Indications: B/L Knee OA   Procedure Details Consent: Risks of  procedure as well as the alternatives and risks of each were explained to the (patient/caregiver). Consent for procedure obtained. Time Out: Verified patient identification, verified procedure, site/side was marked, verified correct patient position, special equipment/implants available, medications/allergies/relevent history reviewed, required imaging and test results available. Performed. The area was cleaned with betadine.   The L and R knee were injected using 2 cc's of 40mg  Depomedrol and 4 cc's of 1% lidocaine with a 21 1 1/2" needle via anteromedial approach.   A sterile dressing was applied.  Patient did tolerate procedure well. Estimated blood loss: None     No orders of the defined types were placed in this encounter.    Meds ordered this encounter  Medications  . methylPREDNISolone acetate (DEPO-MEDROL) injection 40 mg    Sig:   . methylPREDNISolone acetate (DEPO-MEDROL) injection 40 mg    Sig:     Archie Patten PGY-2, [place]

## 2015-07-19 NOTE — Progress Notes (Signed)
[place] Attending Note: I have seen and examined this patient. I have discussed this patient with the resident and reviewed the assessment and plan as documented above. I agree with the resident's findings and plan. 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 ithink it will improve when her knees get better and she loses more weight.

## 2015-07-22 ENCOUNTER — Ambulatory Visit: Payer: Self-pay

## 2015-07-22 VITALS — Ht 67.0 in | Wt 305.6 lb

## 2015-07-22 DIAGNOSIS — Z789 Other specified health status: Secondary | ICD-10-CM

## 2015-07-22 NOTE — Progress Notes (Signed)
Amanda Santiago Patient returns today for Health Coaching. Interpreter Amanda Santiago present. Patient coming mainly because all her BMI and diabetes not under control.    HEALTH COACHING:  Patient and I went over lab results and looked at normal range. Praised patient for having loss around 15 pounds since April 14 th. Asked patient what had been doing to accomplish weight loss. Patient stated that had cut out bread, soda and juice, Stated that was only eating rice twice a week and not eating fruit after 4 PM. We then looked at 1400 to 1600 calorie diet and how many portions can have in each food group. Discussed eating brown rice and make sure increased fiber and not have refine sugars. Patient picked what eats from cards and discussed choices which most were very good choices.   When asked what her goal was? Patient stated to lose enough weight to have knee surgery. Patient stated that injections had not really helped and medications (anti inflammatory agents) do not help. Patient stated that she weighed around 180 lbs before her first child was born. Stated that 160 to 170 lbs would be a good goal but for now to lose what can. Patient stated that next appointment at [facility] was in June.  Asked patient about what was her plan for exercise. Patient stated that was planning on going to Aquatic center and take water aerobics. Patient stated that would be able to take children with her and they could swim. When asked how much cost Patient stated $80 a month. Discussed the possibility of also looking at [facility] because they have a pool and classes. Patient stated she would check because they may cost less. Plus, talked about maybe walk not as long and other classes that could go to. Address of recreation Center and  Phone number were given to patient.  PLAN: Will Call in 3 to 4 weeks. Will continue to lose weight and start exercise program.

## 2015-07-22 NOTE — Patient Instructions (Signed)
Will check into water aerobics. Will vary her meal plan. Will weigh weekly.

## 2015-07-29 ENCOUNTER — Telehealth: Payer: Self-pay

## 2015-07-29 NOTE — Telephone Encounter (Addendum)
[place] Patient called per phone today for Health Coaching by interpreter Lavon Paganini. Patient's areas of concern were weight, A1C, and exercise.   HEALTH COACHING:  Per patient is eating better especially eating more vegetables. When explored how was doing better with increasing fiber in her diet. Patient stated was checking labels and eating foods higher in fiber. Patient stated is trying to stay away from sugary foods and is not drinking any drinks with sugar.Patient stated had decreased the amount of bread, rice, and grease she is eating.  When asked about whether she had made any changes in her activity level, patient stated that is unable to walk distances. Discussed chair exercises again or and some flexibility exercises.  PLAN: Will Call in 2 to 4 weeks. Will continue with increasing exercise. Will continue with the good meal and nutrition changes. Will be doing follow up assessment when call next.

## 2015-08-09 ENCOUNTER — Telehealth: Payer: Self-pay

## 2015-08-09 ENCOUNTER — Other Ambulatory Visit: Payer: Self-pay | Admitting: Internal Medicine

## 2015-08-09 MED FILL — METFORMIN HCL ER 500 MG TAB: 500 | 30 days supply | Qty: 30 | Fill #0

## 2015-08-09 NOTE — Telephone Encounter (Signed)
Patient called per Lavon Paganini interpreter for final assessment.  ASSESSMENT :This is a follow up Assessment following [place] . Marland Kitchen  Medication Status : Patient states is not taking medication for high cholesterol or hypertension. Per patient takes medication for diabetes.   Blood Pressure, Self-measurement: Patient states has no reason to check Blood pressure.  Nutrition Assessment: Patient stated that eats 2 fruits every day. Patient states she eats 3 servings of vegetables a day. Per Patient does not eat 3 or more ounces of whole grains daily. Patient stated doesn't eat two or more servings of fish weekly. Patient states she does not drink more than 36 ounces or 450 calories of beverages with added sugars weekly. Patient stated she does watch her salt intake.   Physical Activity Assessment: Patient stated she is still unable to do moderate or vigorous activity due to her arthritis.  Smoking Status: Patient stated has never smoked and is not exposed to smoke.  Quality of Life Assessment: In assessing patient's Physical quality of life she stated that out of the past 30 days that she has felt her health is good all of them. Patient also stated that in the past 30 days that her mental health was good including stress, depression and problems with emotions for all days. Patient did state that out of the past 30 days she felt her physical or mental health had not kept her from doing her usual activities including self-care, work or recreation.   PLAN: Informed patient that would re screen if does not have insurance when and if she returns to [facility] next year.  TIME SPENT with PATIENT: 15 minutes

## 2015-08-29 ENCOUNTER — Encounter: Payer: Self-pay | Admitting: Internal Medicine

## 2015-08-29 ENCOUNTER — Ambulatory Visit: Payer: Self-pay | Attending: Internal Medicine | Admitting: Internal Medicine

## 2015-08-29 VITALS — BP 122/77 | HR 62 | Temp 97.4°F | Resp 18 | Ht 64.0 in | Wt 279.0 lb

## 2015-08-29 DIAGNOSIS — Z87891 Personal history of nicotine dependence: Secondary | ICD-10-CM | POA: Insufficient documentation

## 2015-08-29 DIAGNOSIS — D1779 Benign lipomatous neoplasm of other sites: Secondary | ICD-10-CM | POA: Insufficient documentation

## 2015-08-29 DIAGNOSIS — Z79899 Other long term (current) drug therapy: Secondary | ICD-10-CM | POA: Insufficient documentation

## 2015-08-29 DIAGNOSIS — N644 Mastodynia: Secondary | ICD-10-CM | POA: Insufficient documentation

## 2015-08-29 DIAGNOSIS — Z87442 Personal history of urinary calculi: Secondary | ICD-10-CM | POA: Insufficient documentation

## 2015-08-29 DIAGNOSIS — Z1211 Encounter for screening for malignant neoplasm of colon: Secondary | ICD-10-CM

## 2015-08-29 DIAGNOSIS — E119 Type 2 diabetes mellitus without complications: Secondary | ICD-10-CM | POA: Insufficient documentation

## 2015-08-29 DIAGNOSIS — M170 Bilateral primary osteoarthritis of knee: Secondary | ICD-10-CM | POA: Insufficient documentation

## 2015-08-29 LAB — BASIC METABOLIC PANEL WITH GFR
BUN: 12 mg/dL (ref 7–25)
CHLORIDE: 102 mmol/L (ref 98–110)
CO2: 27 mmol/L (ref 20–31)
Calcium: 8.6 mg/dL (ref 8.6–10.4)
Creat: 0.58 mg/dL (ref 0.50–1.05)
Glucose, Bld: 116 mg/dL — ABNORMAL HIGH (ref 65–99)
POTASSIUM: 4.3 mmol/L (ref 3.5–5.3)
SODIUM: 138 mmol/L (ref 135–146)

## 2015-08-29 LAB — CBC WITH DIFFERENTIAL/PLATELET
BASOS PCT: 0 %
Basophils Absolute: 0 cells/uL (ref 0–200)
EOS PCT: 1 %
Eosinophils Absolute: 96 cells/uL (ref 15–500)
HCT: 43 % (ref 35.0–45.0)
HEMOGLOBIN: 14.3 g/dL (ref 11.7–15.5)
LYMPHS ABS: 3456 {cells}/uL (ref 850–3900)
Lymphocytes Relative: 36 %
MCH: 29.7 pg (ref 27.0–33.0)
MCHC: 33.3 g/dL (ref 32.0–36.0)
MCV: 89.4 fL (ref 80.0–100.0)
MPV: 10.9 fL (ref 7.5–12.5)
Monocytes Absolute: 672 cells/uL (ref 200–950)
Monocytes Relative: 7 %
NEUTROS ABS: 5376 {cells}/uL (ref 1500–7800)
NEUTROS PCT: 56 %
Platelets: 291 10*3/uL (ref 140–400)
RBC: 4.81 MIL/uL (ref 3.80–5.10)
RDW: 14.6 % (ref 11.0–15.0)
WBC: 9.6 10*3/uL (ref 3.8–10.8)

## 2015-08-29 LAB — POCT GLYCOSYLATED HEMOGLOBIN (HGB A1C): Hemoglobin A1C: 6.5

## 2015-08-29 LAB — TSH: TSH: 1.5 m[IU]/L

## 2015-08-29 LAB — GLUCOSE, POCT (MANUAL RESULT ENTRY): POC GLUCOSE: 116 mg/dL — AB (ref 70–99)

## 2015-08-29 MED ORDER — METFORMIN HCL ER 500 MG PO TB24: 500.0000 mg | ORAL_TABLET | Freq: Every day | ORAL | Status: DC

## 2015-08-29 MED ORDER — PRAVASTATIN SODIUM 20 MG PO TABS: 20.0000 mg | ORAL_TABLET | Freq: Every day | ORAL | Status: DC

## 2015-08-29 MED ORDER — GABAPENTIN 300 MG PO CAPS: 300.0000 mg | ORAL_CAPSULE | Freq: Three times a day (TID) | ORAL | Status: DC

## 2015-08-29 MED ORDER — METFORMIN HCL ER 500 MG PO TB24: 500.0000 mg | ORAL_TABLET | Freq: Two times a day (BID) | ORAL | Status: DC

## 2015-08-29 MED FILL — PRAVASTATIN NA 20 MG TAB: 20 | 30 days supply | Qty: 30 | Fill #0

## 2015-08-29 MED FILL — METFORMIN HCL ER 500 MG TAB: 500 | 15 days supply | Qty: 30 | Fill #0

## 2015-08-29 MED FILL — NITROGLYCERIN 0.2 MG/HR PAT: 0.2 | 28 days supply | Qty: 7 | Fill #0

## 2015-08-29 MED FILL — GABAPENTIN 300 MG CAPSULE: 300 | 30 days supply | Qty: 90 | Fill #0

## 2015-08-29 NOTE — Patient Instructions (Signed)
La diabetes mellitus y los alimentos (Diabetes Mellitus and Food) Es importante que controle su nivel de azcar en la sangre (glucosa). El nivel de glucosa en sangre depende en gran medida de lo que usted come. Comer alimentos saludables en las cantidades [place] a lo largo del Training and development officer, aproximadamente a la misma hora [employer], lo ayudar a Chief Technology Officer su nivel de Multimedia programmer. Tambin puede ayudarlo a retrasar o Patent attorney de la diabetes mellitus. Comer de [employer] saludable incluso puede ayudarlo a Chartered loss adjuster de presin arterial y a Science writer o Theatre manager un peso saludable.  Entre las recomendaciones generales para alimentarse y Audiological scientist los alimentos de forma saludable, se incluyen las siguientes:  Respetar las comidas principales y comer colaciones con regularidad. Evitar pasar largos perodos sin comer con el fin de perder peso.  Seguir una dieta que consista principalmente en alimentos de origen vegetal, como frutas, vegetales, frutos secos, legumbres y cereales integrales.  Utilizar mtodos de coccin a baja temperatura, como hornear, en lugar de mtodos de coccin a alta temperatura, como frer en abundante aceite. Trabaje con el nutricionista para aprender a Financial planner nutricional de las etiquetas de los alimentos. CMO PUEDEN AFECTARME LOS ALIMENTOS? Carbohidratos Los carbohidratos afectan el nivel de glucosa en sangre ms que cualquier otro tipo de alimento. El nutricionista lo ayudar a Teacher, adult education cuntos carbohidratos puede consumir en cada comida y ensearle a contarlos. El recuento de carbohidratos es importante para mantener la glucosa en sangre en un nivel saludable, en especial si utiliza insulina o toma determinados medicamentos para la diabetes mellitus. Alcohol El alcohol puede provocar disminuciones sbitas de la glucosa en sangre (hipoglucemia), en especial si utiliza insulina o toma determinados medicamentos para la diabetes mellitus. La  hipoglucemia es una afeccin que puede poner en peligro la vida. Los sntomas de la hipoglucemia (somnolencia, mareos y Data processing manager) son similares a los sntomas de haber consumido mucho alcohol.  Si el mdico lo autoriza a beber alcohol, hgalo con moderacin y siga estas pautas:  Las mujeres no deben beber ms de un trago por da, y los hombres no deben beber ms de dos tragos por Training and development officer. Un trago es igual a:  12 onzas (355 ml) de cerveza  5 onzas de vino (150 ml) de vino  1,5onzas (23m) de bebidas espirituosas  No beba con el estmago vaco.  Mantngase hidratado. Beba agua, gaseosas dietticas o t helado sin azcar.  Las gaseosas comunes, los jugos y otros refrescos podran contener muchos carbohidratos y se dCivil Service fast streamer QU ALIMENTOS NO SE RECOMIENDAN? Cuando haga las elecciones de alimentos, es importante que recuerde que todos los alimentos son distintos. Algunos tienen menos nutrientes que otros por porcin, aunque podran tener la misma cantidad de caloras o carbohidratos. Es difcil darle al cuerpo lo que necesita cuando consume alimentos con menos nutrientes. Estos son algunos ejemplos de alimentos que debera evitar ya que contienen muchas caloras y carbohidratos, pero pocos nutrientes:  GPhysicist, medicaltrans (la mayora de los alimentos procesados incluyen grasas trans en la etiqueta de Informacin nutricional).  Gaseosas comunes.  Jugos.  Caramelos.  Dulces, como tortas, pasteles, rosquillas y g[place]  Comidas fritas. QU ALIMENTOS PUEDO COMER? Consuma alimentos ricos en nutrientes, que nutrirn el cuerpo y lo mantendrn saludable. Los alimentos que debe comer tambin dependern de varios factores, como:  Las caloras que necesita.  Los medicamentos que toma.  Su peso.  El nivel de glucosa en s[employer]ist College  El nArrow Rockde presin arterial.  El nivel de colesterol.  Debe consumir una amplia variedad de alimentos, por ejemplo:  Protenas.  Cortes de [employer].  Protenas con bajo contenido de grasas saturadas, como pescado, clara de huevo y frijoles. Evite las carnes procesadas.  Frutas y vegetales.  Frutas y Photographer que pueden ayudar a Chief Technology Officer los niveles sanguneos de [place], como [place], mangos y batatas.  Productos lcteos.  Elija productos lcteos sin grasa o con bajo contenido de [place], como [place], yogur y [place].  Cereales, panes, pastas y arroz.  Elija cereales integrales, como panes multicereales, avena en grano y arroz integral. Estos alimentos pueden ayudar a controlar la presin arterial.  Daphene Jaeger.  Alimentos que contengan grasas saludables, como frutos secos, Musician, aceite de [place], aceite de canola y pescado. TODOS LOS QUE PADECEN DIABETES MELLITUS TIENEN EL Naplate PLAN DE [place]? Dado que todas las personas que padecen diabetes mellitus son distintas, no hay un solo plan de comidas que funcione para todos. Es muy importante que se rena con un nutricionista que lo ayudar a crear un plan de comidas adecuado para usted.   Esta informacin no tiene Marine scientist el consejo del mdico. Asegrese de hacerle al mdico cualquier pregunta que tenga.   Document Released: 06/05/2007 Document Revised: 03/19/2014 Elsevier Interactive Patient Education 2016 [place] para comer fuera de su casa si tiene diabetes (Tips for Eating Away From Home If You Have Diabetes) El control del nivel de glucemia, que tambin se conoce como azcar en la [place], puede ser un reto, que se complica an ms cuando uno no prepara sus propias comidas. Los siguientes consejos pueden ayudarlo a Chief Technology Officer la diabetes cuando come fuera de su casa. PLANIFICACIN Organcese si sabe que comer fuera de su casa:  Pregntele al mdico cmo sincronizar las comidas y el medicamento si est en tratamiento con insulina.  Haga una lista de restaurantes cercanos que ofrezcan opciones saludables. Si tienen un men que pueda leer en  su casa, llvelo y planifique lo que pedir con anticipacin.  Busque informacin en lnea del restaurante donde quiera comer. Muchos restaurantes de comida rpida y cadenas de restaurantes incluyen la informacin nutricional en lnea. Tenga en cuenta esta informacin para elegir las opciones ms saludables y calcular los carbohidratos de la comida.  Use un libro de recuento de carbohidratos o una aplicacin mvil para fijarse en el contenido de carbohidratos y el tamao de porcin de lo que desea comer.  Comience a Corporate treasurer de las porciones y a Marine scientist cuntas porciones hay en una unidad. Esto le permitir calcular la cantidad de carbohidratos que puede comer. ALIMENTOS LIBRES Un "alimento libre" es cualquier alimento o bebida que contenga menos de 5g de carbohidratos por porcin. Entre los alimentos libres, se incluyen los siguientes:  Muchos vegetales.  Huevos duros.  Nueces o semillas.  Aceitunas.  Quesos.  Carnes. Estos tipos de alimentos son buenas opciones de bocadillos y en general estn disponibles en los bufs de ensaladas. Como aderezos "libres" para [place], puede usarse jugo de limn, vinagre o un aderezo de bajas caloras (con menos de 20caloras por porcin).  OPCIONES PARA REDUCIR LOS CARBOHIDRATOS  Reemplace el yogur descremado endulzado por el yogur sin azcar. Tambin puede consumir yogur a base de [place], pero es conveniente una opcin sin azcar o natural, porque tiene menos contenido de carbohidratos.  Pdale al mozo que retire la canasta de pan o las papas de la mesa.  Pida frutas frescas. El buf de ensaladas a menudo ofrece frutas  frescas. Evite las frutas enlatadas, ya que por lo general tienen azcar o almbar.  Pida una ensalada y cmala sin aderezo. Tambin puede crear un aderezo "libre" para ensaladas.  Pida que le [employer] alimentos. Por ejemplo, en lugar de papas fritas, pida una porcin de vegetales, como una ensalada,  judas verdes o brcoli. OTROS CONSEJOS   Si [place] insulina, adminstrela una vez que la comida llegue a la mesa, as las Hotel manager.  Pregntele al mozo sobre el tamao de la porcin antes de pedir la comida y, si la porcin es ms grande de lo que usted debe consumir, pida una caja para llevarse la comida a su casa. Cuando llegue la comida, deje en el plato la cantidad que debe comer y coloque el resto en la caja para llevar.  Considere la posibilidad de Publishing rights manager un plato principal con alguien y de pedir una ensalada como guarnicin.   Esta informacin no tiene Marine scientist el consejo del mdico. Asegrese de hacerle al mdico cualquier pregunta que tenga.   Document Released: 02/26/2005 Document Revised: 11/17/2014 Elsevier Interactive Patient Education [employer].  - Diabetes y actividad fsica (Diabetes and Exercise) Hacer actividad fsica con regularidad es muy importante. No se trata solo de [employer]. Tiene muchos otros beneficios, como por ejemplo:  Mejorar el estado fsico, la flexibilidad y la resistencia.  Aumenta la densidad sea.  Ayuda a Technical sales engineer.  Disminuye la Air traffic controller.  Aumenta la fuerza muscular.  Reduce el estrs y las tensiones.  Mejora el estado de salud general. Las personas diabticas que realizan actividad fsica tienen beneficios adicionales debido al ejercicio:  Reduce el apetito.  El organismo mejora el uso del azcar (glucosa) de la [place].  Ayuda a disminuir o Product/process development scientist.  Disminuye la presin arterial.  Ayuda a disminuir los lpidos en la sangre (colesterol y triglicridos).  El organismo mejora el uso de la insulina porque:  Aumenta la sensibilidad del organismo a la insulina.  Reduce las necesidades de insulina del organismo.  Disminuye el riesgo de enfermedad cardaca por la actividad fsica ya que  disminuye el colesterol y [employer] triglicridos.  Aumenta  los niveles de colesterol bueno (como las lipoprotenas de alta densidad [HDL]) en el organismo.  Disminuye los niveles de glucosa en la [place]. SU PLAN DE ACTIVIDAD  Elija una actividad que disfrute y establezca objetivos realistas. Para ejercitarse sin riesgos, debe comenzar a Psychologist, prison and probation services cualquier actividad fsica nueva lentamente y aumentar la intensidad del ejercicio de forma gradual con el tiempo. Su mdico o educador en diabetes podrn ayudarlo a crear un plan de actividades que lo beneficie. Las recomendaciones generales incluyen lo siguiente:  Psychologist, clinical a los nios para que realicen al menos 60 minutos de actividad fsica Armed forces operational officer.  Estirarse y Optometrist ejercicios de entrenamiento de la fuerza, como yoga o levantamiento de pesas, por lo menos 2 veces por semana.  Realizar en total por lo menos 150 minutos de ejercicios de intensidad moderada cada semana, como caminar a paso ligero o hacer gimnasia acutica.  Hacer ejercicio fsico por lo menos 3 das por semana y no dejar pasar ms de 2 das seguidos sin ejercitarse.  Evitar los perodos largos de inactividad (90 minutos o ms tiempo). Cuando deba pasar mucho tiempo sentado, haga pausas frecuentes para caminar o estirarse. RECOMENDACIONES PARA REALIZAR EJERCICIOS CUANDO SE TIENE DIABETES TIPO 1 O TIPO 2   Controle la glucosa en la sangre antes de comenzar. Si  el nivel de glucosa en la sangre es de ms de 240 mg/dl, controle las cetonas en la [place]. No haga actividad fsica si hay cetonas.  Evite inyectarse insulina en las zonas del cuerpo que ejercitar. Por ejemplo, evite inyectarse insulina en:  Los brazos, si juega al tenis.  Las piernas, si corre.  Lleve un registro de:  Los alimentos que consume antes y despus de TEFL teacher.  Los momentos esperables de picos de accin de la insulina.  Los niveles de glucosa en la sangre antes y despus de hacer ejercicios.  El tipo y cantidad de [place] fsica que  Musician.  Revise los registros con su mdico. El mdico lo ayudar a Actor pautas para ajustar la cantidad de alimento y las cantidades de insulina antes y despus de Field seismologist ejercicios.  Si toma insulina o agentes hipoglucemiantes por va oral, observe si hay signos y sntomas de hipoglucemia. Entre los que se incluyen:  Mareos.  Temblores.  Sudoracin.  Escalofros.  Confusin.  Beba gran cantidad de agua mientras hace ejercicios para evitar la deshidratacin o los golpes de Freight forwarder. Durante la actividad fsica se pierde agua corporal que se debe reponer.  Comente con su mdico antes de comenzar un programa de actividad fsica para verificar que sea seguro para usted. Recuerde, cualquier actividad es mejor que ninguna.   Esta informacin no tiene Marine scientist el consejo del mdico. Asegrese de hacerle al mdico cualquier pregunta que tenga.   Document Released: 03/18/2007 Document Revised: 07/13/2014 Elsevier Interactive Patient Education [employer].

## 2015-08-29 NOTE — Progress Notes (Signed)
Amanda Santiago, is a 50 y.o. female  [id number]  [id number]  DOB - 29-Sep-1965  CC:  Chief Complaint  Patient presents with  . Diabetes       HPI: Amanda Santiago is a 50 y.o. female here today to establish medical care, being followed for DM, last seen in clinic 3/17 w/ NP.  Since than, she is doing much better w/ her DM, watching her diet w/ no extra tortillas/white grains/sodas/sweets, etc., and exercising more w/ swimming.  She has lost almost 40lbs since last visit w/ [place].  She states her knees pains are bit better w/ injections.  She does have some pain in her left ankle, and was told prior nothing to do b/c of torn tendon/tendonitis.  Has not had eye exam in very long time, and has had no prior colonoscopy. Breast pains are resolved.  Patient has No headache, No chest pain, No abdominal pain - No Nausea, No new weakness tingling or numbness, No Cough - SOB.  Allergies  Allergen Reactions  . Aspirin Swelling  . Morphine And Related Swelling  . Shellfish-Derived Products Swelling   Past Medical History  Diagnosis Date  . Arthritis   . Kidney stone 9-10 years ago  . Diabetes mellitus without complication ([place])     takes glucaphage   Current Outpatient Prescriptions on File Prior to Visit  Medication Sig Dispense Refill  . albuterol (PROVENTIL HFA;VENTOLIN HFA) 108 (90 BASE) MCG/ACT inhaler Inhale 2 puffs into the lungs every 6 (six) hours as needed for wheezing or shortness of breath. (Patient not taking: Reported on 06/06/2015) 1 Inhaler 0  . clotrimazole (LOTRIMIN) 1 % cream Apply 1 application topically 2 (two) times daily. (Patient not taking: Reported on 03/08/2014) 30 g 0  . fluticasone (FLONASE) 50 MCG/ACT nasal spray Place 2 sprays into both nostrils daily. (Patient not taking: Reported on 06/06/2015) 16 g 1  . lactobacillus acidophilus & bulgar (LACTINEX) chewable tablet Chew 1 tablet by mouth 3 (three) times daily with meals. (Patient not  taking: Reported on 03/08/2014) 90 tablet 0  . naproxen (NAPROSYN) 500 MG tablet Take 1 tablet (500 mg total) by mouth 2 (two) times daily as needed for mild pain, moderate pain or headache (TAKE WITH MEALS.). (Patient not taking: Reported on 06/06/2015) 20 tablet 0  . nystatin (MYCOSTATIN/NYSTOP) 100000 UNIT/GM POWD Use twice daily in skin folds (Patient not taking: Reported on 03/08/2014) 30 g 2  . Specialty Vitamins Products (WEIGHT LOSS DAILY MULTI) TABS Take 1 tablet by mouth 4 (four) times daily. Reported on 08/29/2015     No current facility-administered medications on file prior to visit.   Family History  Problem Relation Age of Onset  . Diabetes Mother   . Hyperlipidemia Mother   . Hypertension Mother    Social History   Social History  . Marital Status: Married    Spouse Name: N/A  . Number of Children: N/A  . Years of Education: N/A   Occupational History  . Not on file.   Social History Main Topics  . Smoking status: Former Smoker -- 1.50 packs/day for 18 years    Types: Cigarettes  . Smokeless tobacco: Former Systems developer    Quit date: 03/12/1997  . Alcohol Use: No  . Drug Use: No  . Sexual Activity: Not on file   Other Topics Concern  . Not on file   Social History Narrative    Review of Systems: Per hPI, o/w all systems reviewed and negative.   Objective:  Filed Vitals:   08/29/15 1102  BP: 122/77  Pulse: 62  Temp: 97.4 F (36.3 C)  Resp: 18    Filed Weights   08/29/15 1102  Weight: 279 lb (126.554 kg)  04/04/15 weight =  336  BP Readings from Last 3 Encounters:  08/29/15 122/77  07/19/15 149/85  06/24/15 136/75    Physical Exam: Constitutional: Patient appears well-developed and well-nourished. No distress. AAOx3, morbid obese, but per records, has lost more than 57 lbs in 73months. HENT: Normocephalic, atraumatic, External right and left ear normal. Oropharynx is clear and moist.  Eyes: Conjunctivae and EOM are normal. PERRL, no scleral  icterus. Neck: Normal ROM. Neck supple. No JVD. CVS: RRR, S1/S2 +, no murmurs, no gallops, no carotid bruit.  Pulmonary: Effort and breath sounds normal, no stridor, rhonchi, wheezes, rales.  Abdominal: Soft. Obese, BS +, no distension, tenderness, rebound or guarding.  Musculoskeletal: Normal range of motion. No edema and no tenderness.  LE: bilat/ no c/c/e, pulses 2+ bilateral. Neuro: Alert.  muscle tone coordination. No cranial nerve deficit grossly. Skin: Skin is warm and dry. No rash noted. Not diaphoretic. No erythema. No pallor. Psychiatric: Normal mood and affect. Behavior, judgment, thought content normal.  Lab Results  Component Value Date   WBC 11.7* 12/23/2013   HGB 17.0* 06/15/2014   HCT 50.0* 06/15/2014   MCV 87.8 12/23/2013   PLT 252 12/23/2013   Lab Results  Component Value Date   CREATININE 0.50 06/15/2014   BUN 17 06/15/2014   NA 139 06/15/2014   K 4.1 06/15/2014   CL 99 06/15/2014   CO2 25 12/23/2013    Lab Results  Component Value Date   HGBA1C 6.5 08/29/2015   Lipid Panel     Component Value Date/Time   CHOL 136 06/24/2015 1009   CHOL 88 09/14/2013 1012   TRIG 162* 06/24/2015 1009   TRIG 116 09/14/2013 1012   HDL 53 06/24/2015 1009   HDL 41 09/14/2013 1012   CHOLHDL 2.6 06/24/2015 1009   CHOLHDL 2.1 09/14/2013 1012   VLDL 23 09/14/2013 1012   LDLCALC 51 06/24/2015 1009   LDLCALC 24 09/14/2013 1012       Depression screen PHQ 2/9 08/29/2015 06/06/2015 06/06/2015 04/15/2015 06/21/2014  Decreased Interest 0 0 0 0 1  Down, Depressed, Hopeless 0 2 0 0 1  PHQ - 2 Score 0 2 0 0 2  Altered sleeping - 3 - - 1  Tired, decreased energy - 2 - - 1  Change in appetite - 0 - - 1  Feeling bad or failure about yourself  - 2 - - 1  Trouble concentrating - 0 - - 1  Moving slowly or fidgety/restless - 0 - - 0  Suicidal thoughts - 1 - - 0  PHQ-9 Score - 10 - - 7   06/16/15 dx breast TOMO  And bilat US IMPRESSION: 1. Small left breast cyst. 2. Large lipoma or  prominent fat pad in the right axillary region. 3. No evidence of malignancy.   Assessment and plan:   1. Diabetes mellitus without complication ([place]) Much better controlled! tol metformin w/o issues. - Glucose (CBG) 116 - Ambulatory referral to Ophthalmology - annual eye exam - BASIC METABOLIC PANEL WITH GFR - CBC with Differential - POCT A1C 6.5 today - Microalbumin/Creatinine Ratio, Urine - diet/exercise reinforced, congratulated her on her successes so far!  2. Morbid obesity, unspecified obesity type ([place]) Prior normal, will rechk. - TSH  3. Colon cancer screening -  Ambulatory referral to Gastroenterology  4. Bilateral knee Oa - prn knee injections w/ ortho  5. Peroneal tendonopathy -  Right ankel Defer to ortho, losins weight will help tremendously.  6. Prior breast pains - resolved, wkup w/ dx tomo breast and bilat [place] noted neg, small left breast cyst., large lipoma in right axillary region.  Return in about 3 months (around 11/29/2015).  The patient was given clear instructions to go to ER or return to medical center if symptoms don't improve, worsen or new problems develop. The patient verbalized understanding. The patient was told to call to get lab results if they haven't heard anything in the next week.      Maren Reamer, MD, [place], [place]   08/29/2015, 11:31 AM

## 2015-08-29 NOTE — Progress Notes (Signed)
Patient is here for DM FU  Patient complains of 4 fingers in right hand being numb. Patient complains of left foot currently having pain. Patient states pain increases with foot dangling.  Patient has taken metformin today and patient has eaten.

## 2015-08-30 LAB — MICROALBUMIN / CREATININE URINE RATIO
Creatinine, Urine: 59 mg/dL (ref 20–320)
MICROALB UR: 0.4 mg/dL
Microalb Creat Ratio: 7 mcg/mg creat (ref <30)

## 2015-09-01 ENCOUNTER — Telehealth: Payer: Self-pay | Admitting: *Deleted

## 2015-09-01 NOTE — Telephone Encounter (Signed)
Medical Assistant used [employer] to contact patient.  Interpreter Name: Winfred Burn #: [id number]  Patient verified DOB Patient is aware of labs results being normal including kidney and thyroid function and blood count. No further questions at this time.

## 2015-09-01 NOTE — Telephone Encounter (Signed)
-----   Message from Maren Reamer, MD sent at 08/30/2015  9:10 AM EDT ----- Please call pt w/ results.  Her labs look good, kidney function normal, thyroid normal, blood count normal as well .thanks

## 2015-09-16 ENCOUNTER — Encounter: Payer: Self-pay | Admitting: Gastroenterology

## 2015-09-26 ENCOUNTER — Ambulatory Visit: Payer: Self-pay | Attending: Internal Medicine

## 2015-09-26 MED FILL — METFORMIN HCL ER 500 MG TAB: 500 | 30 days supply | Qty: 30 | Fill #1

## 2015-10-21 ENCOUNTER — Ambulatory Visit (INDEPENDENT_AMBULATORY_CARE_PROVIDER_SITE_OTHER): Payer: Self-pay | Admitting: Family Medicine

## 2015-10-21 ENCOUNTER — Encounter: Payer: Self-pay | Admitting: Family Medicine

## 2015-10-21 VITALS — BP 137/77 | Ht 67.0 in | Wt 271.0 lb

## 2015-10-21 DIAGNOSIS — M25561 Pain in right knee: Secondary | ICD-10-CM

## 2015-10-21 DIAGNOSIS — M25572 Pain in left ankle and joints of left foot: Secondary | ICD-10-CM

## 2015-10-21 DIAGNOSIS — M170 Bilateral primary osteoarthritis of knee: Secondary | ICD-10-CM

## 2015-10-21 DIAGNOSIS — M25562 Pain in left knee: Secondary | ICD-10-CM

## 2015-10-21 MED ORDER — METHYLPREDNISOLONE ACETATE 40 MG/ML IJ SUSP
40.0000 mg | Freq: Once | INTRAMUSCULAR | Status: AC
  Administered 2015-10-21: 40 mg via INTRA_ARTICULAR

## 2015-10-21 MED ORDER — NITROGLYCERIN 0.2 MG/HR TD PT24: MEDICATED_PATCH | TRANSDERMAL | 1 refills | Status: DC

## 2015-10-21 NOTE — Progress Notes (Signed)
  Analiz Essary - 50 y.o. female MRN [id number]  Date of birth: [birth year]    SUBJECTIVE:    Interpreter [place]  Chief Complaint: Bilateral knee pain and left ankle pain   HPI: I'll knee pain left greater than right. She had significant as in 75-80% improvement after last injections. She was able to go to [facility] and he quite a bit of walking was extremely happy about that. Normally in the last 4 weeks or so has she started to have pain again area on the left knee she's also noticed some fullness in the popliteal space and he feels stiffer than the right. She would like to get injections again.  We had sent her to orthopedic office for violation of the left ankle. He says they told her she had a tendon tear and put her on nitroglycerin patch but did not set specific follow-up. She is the patch and she ran out of them. They seem to help some. The pain her ankle since she stop using the patches back to 8 out of 10 most of the time. She says it bothers her almost more than her knees. ROS:     No unusual weight change, fever, sweats, chills. She's had some mild swelling most knees left greater than right. She's noted no swelling of the ankles, no lower extremity edema.  PERTINENT  PMH / PSH FH / / SH:  Past Medical, Surgical, Social, and Family History Reviewed & Updated in the EMR.  Pertinent findings include:  Morbid obesity Diabetes mellitus   OBJECTIVE: BP 137/77   Ht 5\' 7"  (1.702 m)   Wt 271 lb (122.9 kg)   BMI 42.44 kg/m   Physical Exam:  Vital signs are reviewed. GEN.: Well-developed obese female no acute distress KNEES: No apparent effusion. Lacks full extension on the left lung by about 5. Right full extension. Full flexion bilaterally. Popliteal space on the left is full but soft. Popliteal space on the right is benign. Calf is soft bilaterally. Both knees are ligamentously intact to varus and valgus stress. She has joint line tenderness bilateral medial joint lines  and left lateral joint line.  ANKLE: Left. Tender to palpation along the medial border of the Achilles tendon. There is no defect noted in the Achilles. Tenderness along the posterior tibial tendon. FOOT: Pes planus with medial mid foot collapse.   INJECTION: Patient was given informed consent, signed copy in the chart. Appropriate time out was taken. Area prepped and draped in usual sterile fashion. 1 cc of methylprednisolone 40 mg/ml plus  4 cc of 1% lidocaine without epinephrine was injected into the bilateral knees using a(n) anterior medial approach. The patient tolerated the procedure well. There were no complications. Post procedure instructions were given.  ASSESSMENT & PLAN: I will call her with suspension interpreter when I get her MRI of the ankle results. Is scheduled August 21. See problem based charting & AVS for pt instructions.

## 2015-10-21 NOTE — Assessment & Plan Note (Signed)
We had done an ultrasound on her several months ago that showed some posterior tibial tendon insufficiency. She was told by the orthopedist that she had a tendon tear and she points to her Achilles when she's talking about this. Very unclear exactly what's going on. I think we'll get MRI to fully define this in the meantime I'll give her some more nitroglycerin patch is because this seems to be helping and I'll see her back in 2 months to follow-up for the ankle.

## 2015-10-31 ENCOUNTER — Other Ambulatory Visit: Payer: Self-pay

## 2015-11-03 ENCOUNTER — Ambulatory Visit (AMBULATORY_SURGERY_CENTER): Payer: Self-pay | Admitting: *Deleted

## 2015-11-03 VITALS — Ht 66.0 in | Wt 270.8 lb

## 2015-11-03 DIAGNOSIS — Z1211 Encounter for screening for malignant neoplasm of colon: Secondary | ICD-10-CM

## 2015-11-03 MED ORDER — NA SULFATE-K SULFATE-MG SULF 17.5-3.13-1.6 GM/177ML PO SOLN: 1.0000 | Freq: Once | ORAL | 0 refills | Status: AC

## 2015-11-03 NOTE — Progress Notes (Signed)
Denies allergies to eggs or soy products. Denies complications with sedation or anesthesia. Denies O2 use. Denies use of diet or weight loss medications.  Emmi instructions given for colonoscopy.  Patient denies being in a clinical research study. She denies any personal history of cancer.  Murray Hodgkins with [employer], interpreter.

## 2015-11-07 ENCOUNTER — Other Ambulatory Visit: Payer: Self-pay

## 2015-11-07 ENCOUNTER — Ambulatory Visit
Admission: RE | Admit: 2015-11-07 | Discharge: 2015-11-07 | Disposition: A | Discharge status: Home / Self Care | Payer: No Typology Code available for payment source | Source: Ambulatory Visit | Attending: Family Medicine | Admitting: Family Medicine

## 2015-11-07 DIAGNOSIS — M25572 Pain in left ankle and joints of left foot: Secondary | ICD-10-CM

## 2015-11-08 ENCOUNTER — Telehealth: Payer: Self-pay | Admitting: Family Medicine

## 2015-11-08 NOTE — Telephone Encounter (Signed)
Rhea or [employer] Can u look and see if she has a follow up with me--if she does in next 2 weeks nothing else we need to do--if not, we probably need to get an interpretor on the phone and make her an appointment so I can discuss her MRI results THANKS! Dorcas Mcmurray

## 2015-11-08 NOTE — Telephone Encounter (Signed)
She has one on Friday with you

## 2015-11-11 ENCOUNTER — Encounter: Payer: Self-pay | Admitting: Family Medicine

## 2015-11-11 ENCOUNTER — Encounter (INDEPENDENT_AMBULATORY_CARE_PROVIDER_SITE_OTHER): Payer: Self-pay

## 2015-11-11 ENCOUNTER — Ambulatory Visit (INDEPENDENT_AMBULATORY_CARE_PROVIDER_SITE_OTHER): Payer: Self-pay | Admitting: Family Medicine

## 2015-11-11 DIAGNOSIS — M25572 Pain in left ankle and joints of left foot: Secondary | ICD-10-CM

## 2015-11-11 MED FILL — METFORMIN HCL ER 500 MG TAB: 500 | 30 days supply | Qty: 30 | Fill #2

## 2015-11-11 MED FILL — NITROGLYCERIN 0.2 MG/HR PAT: 0.2 | 28 days supply | Qty: 7 | Fill #1

## 2015-11-11 NOTE — Progress Notes (Signed)
Interpreter for visit is [employer]

## 2015-11-13 NOTE — Assessment & Plan Note (Signed)
Long discussion, spending > 50% of 25 minute ov in explanation of meaning MRI images, long term prognosis, counseling and education. Congratulated on weight loss! Discussed appropriate way to use NTG patch Discussed that this will be long process and complete pain resolution unlikely Recommended she but some lace up supportive shoes and use those more or less exclusively next 3 months---we had made her orthotics but she has nit benefitted from them as of yet. I want to keep her on track so will see her back in 4-6 weeks and likely every 3 months during process. Exercise handout given and explained---eversion, inversion, dbl and single stance heel raise, proprioception. HEP very important!

## 2015-11-13 NOTE — Progress Notes (Signed)
    CHIEF COMPLAINT / HPI: LEFT ankle pain Had MRI Pain is maybe slightly improved. She began using the NTG patch and was havng no side effects but misunderstood and was only wearing it intermittently.   REVIEW OF SYSTEMS:  Continued pain in ankle on left, [place] knees. Has had some intentional weight loss.  OBJECTIVE:  Vital signs are reviewed.  GEN: WD WN NAD ANKLE LEFT: stable ligamentously. No effusion. TTP peronela tendons and posterior achilles.  MRI. Tendinopathy and potential mild partial tearing of the anterior distal portion of the Achilles tendon with mild pre Achilles bursitis. 2. Peroneus longus tendinopathy focally behind the lateral malleolus and also adjacent to the distal calcaneus. 3. Distal tibialis posterior tendinopathy with some adjacent thickening of the superomedial portion of the spring ligament -correlate clinically in assessing for tibialis posterior dysfunction. 4. Thickened medial band of the plantar fascia compatible with plantar fasciitis. 5. Mild degenerative spurring along the subtalar joint.RI LEFT ankle 11/07/2015: ASSESSMENT / PLAN: Please see problem oriented charting for details

## 2015-11-18 ENCOUNTER — Encounter: Payer: Self-pay | Admitting: Gastroenterology

## 2015-11-18 ENCOUNTER — Ambulatory Visit (AMBULATORY_SURGERY_CENTER): Payer: Self-pay | Admitting: Gastroenterology

## 2015-11-18 VITALS — BP 125/69 | HR 59 | Temp 97.5°F | Resp 14 | Ht 66.0 in | Wt 270.0 lb

## 2015-11-18 DIAGNOSIS — K635 Polyp of colon: Secondary | ICD-10-CM

## 2015-11-18 DIAGNOSIS — D125 Benign neoplasm of sigmoid colon: Secondary | ICD-10-CM

## 2015-11-18 DIAGNOSIS — Z1211 Encounter for screening for malignant neoplasm of colon: Secondary | ICD-10-CM

## 2015-11-18 DIAGNOSIS — K639 Disease of intestine, unspecified: Secondary | ICD-10-CM

## 2015-11-18 LAB — GLUCOSE, CAPILLARY
GLUCOSE-CAPILLARY: 97 mg/dL (ref 65–99)
Glucose-Capillary: 88 mg/dL (ref 65–99)

## 2015-11-18 MED ORDER — SODIUM CHLORIDE 0.9 % IV SOLN: 500.0000 mL | INTRAVENOUS | Status: DC

## 2015-11-18 NOTE — Progress Notes (Signed)
Called to room to assist during endoscopic procedure.  Patient ID and intended procedure confirmed with present staff. Received instructions for my participation in the procedure from the performing physician.  

## 2015-11-18 NOTE — Progress Notes (Signed)
To recovery, report to [place], RN, VSS. 

## 2015-11-18 NOTE — Op Note (Signed)
[facility] Patient Name: Amanda Santiago Procedure Date: 11/18/2015 8:47 AM MRN: [id number] Endoscopist: Mauri Pole , MD Age: 50 Referring MD:  Date of Birth: 1965-12-31 Gender: Female Account #: [id number] Procedure:                Colonoscopy Indications:              Screening for colorectal malignant neoplasm,                            Screening for colon cancer: Family history of                            colorectal cancer in distant relative(s) (Maternal                            grandmother) > age 28 Medicines:                Monitored Anesthesia Care Procedure:                Pre-Anesthesia Assessment:                           - Prior to the procedure, a History and Physical                            was performed, and patient medications and                            allergies were reviewed. The patient's tolerance of                            previous anesthesia was also reviewed. The risks                            and benefits of the procedure and the sedation                            options and risks were discussed with the patient.                            All questions were answered, and informed consent                            was obtained. Prior Anticoagulants: The patient has                            taken no previous anticoagulant or antiplatelet                            agents. ASA Grade Assessment: III - A patient with                            severe systemic disease. After reviewing the risks  and benefits, the patient was deemed in                            satisfactory condition to undergo the procedure.                           After obtaining informed consent, the colonoscope                            was passed under direct vision. Throughout the                            procedure, the patient's blood pressure, pulse, and                            oxygen saturations were monitored  continuously. The                            Model CF-H180AL [id number]) scope was introduced                            through the anus and advanced to the the cecum,                            identified by appendiceal orifice and ileocecal                            valve. The colonoscopy was performed without                            difficulty. The patient tolerated the procedure                            well. The quality of the bowel preparation was                            good. The ileocecal valve, appendiceal orifice, and                            rectum were photographed. Scope In: 8:58:45 AM Scope Out: 9:19:43 AM Scope Withdrawal Time: 0 hours 13 minutes 2 seconds  Total Procedure Duration: 0 hours 20 minutes 58 seconds  Findings:                 The perianal and digital rectal examinations were                            normal.                           A patchy area of granular mucosa with mucosal edema                            and erosions was found in the sigmoid colon and in  the descending colon. Biopsies were taken with a                            cold forceps for histology.                           A 4 mm polyp was found in the sigmoid colon. The                            polyp was sessile. The polyp was removed with a                            cold snare. Resection and retrieval were complete.                           Non-bleeding internal hemorrhoids were found during                            retroflexion. The hemorrhoids were medium-sized.                           The exam was otherwise without abnormality. Complications:            No immediate complications. Estimated Blood Loss:     Estimated blood loss was minimal. Impression:               - Granularity in the sigmoid colon and in the                            descending colon. Biopsied.                           - One 4 mm polyp in the sigmoid colon, removed with                             a cold snare. Resected and retrieved.                           - Non-bleeding internal hemorrhoids.                           - The examination was otherwise normal. Recommendation:           - Patient has a contact number available for                            emergencies. The signs and symptoms of potential                            delayed complications were discussed with the                            patient. Return to normal activities tomorrow.  Written discharge instructions were provided to the                            patient.                           - Resume previous diet.                           - Continue present medications.                           - Await pathology results.                           - No aspirin, ibuprofen, naproxen, or other                            non-steroidal anti-inflammatory drugs.                           - Repeat colonoscopy in 5-10 years for surveillance                            based on pathology results.                           - Return to GI clinic PRN. Mauri Pole, MD 11/18/2015 9:28:33 AM This report has been signed electronically.

## 2015-11-18 NOTE — Patient Instructions (Addendum)
YOU HAD AN ENDOSCOPIC PROCEDURE TODAY AT [facility]:   Refer to the procedure report that was given to you for any specific questions about what was found during the examination.  If the procedure report does not answer your questions, please call your gastroenterologist to clarify.  If you requested that your care partner not be given the details of your procedure findings, then the procedure report has been included in a sealed envelope for you to review at your convenience later.  YOU SHOULD EXPECT: Some feelings of bloating in the abdomen. Passage of more gas than usual.  Walking can help get rid of the air that was put into your GI tract during the procedure and reduce the bloating. If you had a lower endoscopy (such as a colonoscopy or flexible sigmoidoscopy) you may notice spotting of blood in your stool or on the toilet paper. If you underwent a bowel prep for your procedure, you may not have a normal bowel movement for a few days.  Please Note:  You might notice some irritation and congestion in your nose or some drainage.  This is from the oxygen used during your procedure.  There is no need for concern and it should clear up in a day or so.  SYMPTOMS TO REPORT IMMEDIATELY:   Following lower endoscopy (colonoscopy or flexible sigmoidoscopy):  Excessive amounts of blood in the stool  Significant tenderness or worsening of abdominal pains  Swelling of the abdomen that is new, acute  Fever of 100F or higher  For urgent or emergent issues, a gastroenterologist can be reached at any hour by calling [phone].   DIET:  We do recommend a small meal at first, but then you may proceed to your regular diet.  Drink plenty of fluids but you should avoid alcoholic beverages for 24 hours.  ACTIVITY:  You should plan to take it easy for the rest of today and you should NOT DRIVE or use heavy machinery until tomorrow (because of the sedation medicines used during the test).     FOLLOW UP: Our staff will call the number listed on your records the next business day following your procedure to check on you and address any questions or concerns that you may have regarding the information given to you following your procedure. If we do not reach you, we will leave a message.  However, if you are feeling well and you are not experiencing any problems, there is no need to return our call.  We will assume that you have returned to your regular daily activities without incident.  If any biopsies were taken you will be contacted by phone or by letter within the next 1-3 weeks.  Please call us at [phone] if you have not heard about the biopsies in 3 weeks.    SIGNATURES/CONFIDENTIALITY: You and/or your care partner have signed paperwork which will be entered into your electronic medical record.  These signatures attest to the fact that that the information above on your After Visit Summary has been reviewed and is understood.  Full responsibility of the confidentiality of this discharge information lies with you and/or your care-partner.  Polyps-handouts given  No aspirin, ibuprofen, naproxen, NSAIDS.  Repeat colonoscopy will be determined by pathology.  Plipos en el colon  (Colon Polyps) Los plipos son masas de tejido que crecen dentro del cuerpo. Los plipos pueden desarrollarse en el intestino grueso (colon). La mayora de los plipos son no cancerosos (benignos). Sin embargo, algunos  plipos pueden convertirse en cancerosos con el tiempo. Los plipos que sean ms grandes que un guisante pueden ser [employer]. Para estar seguros, los mdicos extirpan y [employer] plipos.  CAUSAS  Se forman cuando ciertas mutaciones genticas hacen que las clulas se desarrollen y se dividan por dems.  FACTORES DE RIESGO  Hay un nmero de factores de riesgo que pueden aumentar las probabilidades de padecer plipos en el colon. Ellos son:   Ser mayor de 20 aos de  edad.  Historia familiar de cncer o plipos de colon.  Ciertas enfermedades crnicas como la colitis o la enfermedad de Crohn.  Tener sobrepeso.  El hbito de fumar.  El sedentarismo.  Beber alcohol en exceso. SNTOMAS  La mayor parte de los plipos no causa sntomas. Si se presentan sntomas, stos pueden ser:   [employer] materia fecal. Heces de color rojo oscuro o negro.  Constipacin o diarrea que duran ms de 1 semana. DIAGNSTICO  [place] persona con frecuencia no sabe que tiene plipos [employer] su mdico los halla durante un examen fsico de [place]. El mdico puede usar 4 tipos de pruebas para Hydrographic surveyor plipos:   Examen Musician. Se colocar guantes y palpar el interior del recto. Esta prueba detectar solo plipos en el recto.  Enema de bario. El mdico introduce un lquido llamado bario en el recto y luego toma radiografas del colon. El bario hace que el colon se vea blanco. Los plipos son de color oscuro, por lo que son fciles de Chiropodist.  Sigmoideoscopia. Se coloca un tubo delgado y flexible (sigmoideoscopio) en el recto. Este sigmoideoscopio tiene [place] fuente de luz y Ardelia Mems pequea cmara de video. El mdico [place] el sigmoideoscopio para observar el ltimo tercio del colon.  Colonoscopa. Esta prueba es similar a la sigmoideoscopia, pero el mdico examina todo el colon. Este es el mtodo ms frecuente para Hydrographic surveyor y extirpar los plipos. TRATAMIENTO  Todo plipo ser extirpado Daneil Dan sigmoideoscopa o una colonoscopa. Luego se analizan para Heritage manager.  PREVENCIN  Para disminuir los riesgos de volver a Best boy plipos en el colon:   Coma mucha fruta y [place]. Evite las comidas grasas.  No fume.  Evite consumir alcohol.  [place].  Baje de peso segn las indicaciones de su mdico.  Consuma mucho clcio y folato. Las comidas que contienen calcio son la [place], los quesos y el brcoli. Las comidas que  contienen folato son los garbanzos, los frijoles rojos y Nurse, mental health. INSTRUCCIONES PARA EL CUIDADO EN EL HOGAR  Cumpla con todas las visitas de control, segn le indique su mdico. Podr necesitar exmenes peridicos para controlar si vuelven a aparecer.  SOLICITE ATENCIN MDICA SI:  Nota sangrado al mover el intestino.    Esta informacin no tiene Marine scientist el consejo del mdico. Asegrese de hacerle al mdico cualquier pregunta que tenga.   Document Released: 08/28/2011 Elsevier Interactive Patient Education [employer].

## 2015-11-21 ENCOUNTER — Telehealth: Payer: Self-pay | Admitting: *Deleted

## 2015-11-21 NOTE — Telephone Encounter (Signed)
  Follow up Call-  Call back number 11/18/2015  Post procedure Call Back phone  # [phone]  Permission to leave phone message Yes  Some recent data might be hidden     Patient questions:  Do you have a fever, pain , or abdominal swelling? No. Pain Score  0 *  Have you tolerated food without any problems? Yes.    Have you been able to return to your normal activities? Yes.    Do you have any questions about your discharge instructions: Diet   No. Medications  No. Follow up visit  No.  Do you have questions or concerns about your Care? No.  Actions: * If pain score is 4 or above: No action needed, pain <4.

## 2015-11-23 ENCOUNTER — Encounter: Payer: Self-pay | Admitting: Gastroenterology

## 2015-12-05 MED FILL — NITROGLYCERIN 0.2 MG/HR PAT: 0.2 | 28 days supply | Qty: 7 | Fill #2

## 2015-12-06 ENCOUNTER — Ambulatory Visit: Payer: No Typology Code available for payment source | Attending: Internal Medicine | Admitting: Internal Medicine

## 2015-12-06 VITALS — BP 135/84 | HR 69 | Temp 98.1°F | Resp 20 | Wt 263.0 lb

## 2015-12-06 DIAGNOSIS — Z886 Allergy status to analgesic agent status: Secondary | ICD-10-CM | POA: Insufficient documentation

## 2015-12-06 DIAGNOSIS — E785 Hyperlipidemia, unspecified: Secondary | ICD-10-CM | POA: Insufficient documentation

## 2015-12-06 DIAGNOSIS — Z7984 Long term (current) use of oral hypoglycemic drugs: Secondary | ICD-10-CM | POA: Insufficient documentation

## 2015-12-06 DIAGNOSIS — Z885 Allergy status to narcotic agent status: Secondary | ICD-10-CM | POA: Insufficient documentation

## 2015-12-06 DIAGNOSIS — E119 Type 2 diabetes mellitus without complications: Secondary | ICD-10-CM | POA: Insufficient documentation

## 2015-12-06 DIAGNOSIS — Z91013 Allergy to seafood: Secondary | ICD-10-CM | POA: Insufficient documentation

## 2015-12-06 DIAGNOSIS — E1121 Type 2 diabetes mellitus with diabetic nephropathy: Secondary | ICD-10-CM | POA: Insufficient documentation

## 2015-12-06 DIAGNOSIS — M170 Bilateral primary osteoarthritis of knee: Secondary | ICD-10-CM | POA: Insufficient documentation

## 2015-12-06 DIAGNOSIS — E0821 Diabetes mellitus due to underlying condition with diabetic nephropathy: Secondary | ICD-10-CM

## 2015-12-06 DIAGNOSIS — Z23 Encounter for immunization: Secondary | ICD-10-CM | POA: Insufficient documentation

## 2015-12-06 DIAGNOSIS — Z87442 Personal history of urinary calculi: Secondary | ICD-10-CM | POA: Insufficient documentation

## 2015-12-06 DIAGNOSIS — H6121 Impacted cerumen, right ear: Secondary | ICD-10-CM | POA: Insufficient documentation

## 2015-12-06 DIAGNOSIS — Z6841 Body Mass Index (BMI) 40.0 and over, adult: Secondary | ICD-10-CM | POA: Insufficient documentation

## 2015-12-06 LAB — POCT GLYCOSYLATED HEMOGLOBIN (HGB A1C): HEMOGLOBIN A1C: 5.8

## 2015-12-06 LAB — GLUCOSE, POCT (MANUAL RESULT ENTRY): POC GLUCOSE: 97 mg/dL (ref 70–99)

## 2015-12-06 MED ORDER — PRAVASTATIN SODIUM 20 MG PO TABS: 20.0000 mg | ORAL_TABLET | Freq: Every day | ORAL | 3 refills | Status: DC

## 2015-12-06 MED ORDER — GABAPENTIN 300 MG PO CAPS: 300.0000 mg | ORAL_CAPSULE | Freq: Three times a day (TID) | ORAL | 3 refills | Status: DC

## 2015-12-06 MED ORDER — METFORMIN HCL ER 500 MG PO TB24: 500.0000 mg | ORAL_TABLET | Freq: Every day | ORAL | 3 refills | Status: DC

## 2015-12-06 MED ORDER — CARBAMIDE PEROXIDE 6.5 % OT SOLN: 5.0000 [drp] | Freq: Two times a day (BID) | OTIC | 0 refills | Status: DC

## 2015-12-06 NOTE — Progress Notes (Signed)
Amanda Santiago, is a 50 y.o. female  [id number]  [id number]  DOB - [birth year]  Chief Complaint  Patient presents with  . Diabetes        Subjective:   Amanda Santiago is a 50 y.o. female here today for a follow up visit of  diabetes and hyperlipidemia.  She is doing well w/o complaints. She had neg colonoscopy 11/18/15, needs repeat in 10 years.  She was unable to see optho due to no insurance. Pt has orange card and cone discount though.  She is watching her carbs. Not able to exercise much due to her knee pains, etc.  She c/o of some mild irritation on her right ear, and talked about her tonsils. From there interpreter, it sounded like her tonsils per present, but do not bother her.  Patient has No headache, No chest pain, No abdominal pain - No Nausea, No new weakness tingling or numbness, No Cough - SOB.  Interpreter was used to communicate directly with patient for the entire encounter including providing detailed patient instructions.  No problems updated.  ALLERGIES: Allergies  Allergen Reactions  . Aspirin Swelling  . Morphine And Related Swelling  . Shellfish-Derived Products Swelling    PAST MEDICAL HISTORY: Past Medical History:  Diagnosis Date  . Arthritis   . Diabetes mellitus without complication ([place])    takes glucaphage  . Kidney stone 9-10 years ago    MEDICATIONS AT HOME: Prior to Admission medications   Medication Sig Start Date End Date Taking? Authorizing Provider  gabapentin (NEURONTIN) 300 MG capsule Take 1 capsule (300 mg total) by mouth 3 (three) times daily. 12/06/15  Yes Maren Reamer, MD  metFORMIN (GLUCOPHAGE-XR) 500 MG 24 hr tablet Take 1 tablet (500 mg total) by mouth daily with breakfast. 12/06/15  Yes Maren Reamer, MD  nitroGLYCERIN (NITRODUR - DOSED IN MG/24 HR) 0.2 mg/hr patch Cut patch into one - fourth pieces Place a one fourth piece of patch on  skin over affected area, changing to a new piece every 24  hours. 10/21/15  Yes Dickie La, MD  pravastatin (PRAVACHOL) 20 MG tablet Take 1 tablet (20 mg total) by mouth daily. 12/06/15  Yes Maren Reamer, MD  carbamide peroxide (DEBROX) 6.5 % otic solution Place 5 drops into the right ear 2 (two) times daily. 12/06/15   Maren Reamer, MD  clotrimazole (LOTRIMIN) 1 % cream Apply 1 application topically 2 (two) times daily. Patient not taking: Reported on 03/08/2014 10/22/13   Lance Bosch, NP  nystatin (MYCOSTATIN/NYSTOP) 100000 UNIT/GM POWD Use twice daily in skin folds 10/22/13   Lance Bosch, NP  Specialty Vitamins Products (WEIGHT LOSS DAILY MULTI) TABS Take 1 tablet by mouth 4 (four) times daily. Reported on 08/29/2015 08/13/13   Historical Provider, MD     Objective:   Vitals:   12/06/15 1544  BP: 135/84  Pulse: 69  Resp: 20  Temp: 98.1 F (36.7 C)  TempSrc: Oral  SpO2: 97%  Weight: 263 lb (119.3 kg)    Exam General appearance : Awake, alert, not in any distress. Speech Clear. Not toxic looking, morbid obese HEENT: Atraumatic and Normocephalic, pupils equally reactive to light. Mild ceruminosis right ear, although TM is clear and no external otitis noted. left TM clear.  Large bilat tonsils, no exudate noted. Neck: supple, no JVD. No cervical lymphadenopathy.  Chest:Good air entry bilaterally, no added sounds. CVS: S1 S2 regular, no murmurs/gallups or rubs. Abdomen: Bowel sounds active, obese.  Non tender and not distended with no gaurding, rigidity or rebound. Foot exam: bilateral peripheral pulses 2+ (dorsalis pedis and post tibialis pulses), no ulcers noted/no ecchymosis, warm to touch, monofilament testing 3/3 bilat. Sensation intact.  No c/c/e. Neurology: Awake alert, and oriented X 3, CN II-XII grossly intact, Non focal Skin:No Rash  Data Review Lab Results  Component Value Date   HGBA1C 5.8 12/06/2015   HGBA1C 6.5 08/29/2015   HGBA1C 8.5 (H) 06/24/2015    Depression screen PHQ 2/9 12/06/2015 08/29/2015 06/06/2015  06/06/2015 04/15/2015  Decreased Interest 0 0 0 0 0  Down, Depressed, Hopeless 0 0 2 0 0  PHQ - 2 Score 0 0 2 0 0  Altered sleeping 1 - 3 - -  Tired, decreased energy 1 - 2 - -  Change in appetite 0 - 0 - -  Feeling bad or failure about yourself  0 - 2 - -  Trouble concentrating 0 - 0 - -  Moving slowly or fidgety/restless 0 - 0 - -  Suicidal thoughts 0 - 1 - -  PHQ-9 Score 2 - 10 - -      Assessment & Plan   1. Diabetes mellitus due to underlying condition with diabetic nephropathy, without long-term current use of insulin ([place]) - doing very well, continue low carb diet, continue metformin and pravastatin for cva/cad prevention/protection. - Glucose (CBG) 97 - HgB A1c 5.8 - Ambulatory referral to Ophthalmology again - she has orange card/cone discount, hopefully will cover.  2. Morbid obesity, unspecified obesity type ([place]) Recd low carb diet for weight loss, <50grm Info on LCHF/IF diet provided, may notice she will not need metformin if her sugars improve even further.  3. Primary osteoarthritis of both knees Worsened w/ her obesity, hopefully with some weight loss pains should improve as well  4. Encounter for immunization - Flu Vaccine QUAD 36+ mos IM - tdap today - pneumococcal 23v today.  5. Right ear mild ceruminosis - debrox gtt, would not recd qtips, may cause truama  6. Language barrier - pt's English was broken and understandable. Sometimes when interpreter interpreted comments, pt would give me a weird look like it was not translated appropriately. Suspect some things loss in translation/    Patient have been counseled extensively about nutrition and exercise  Return in about 3 months (around 03/06/2016).  The patient was given clear instructions to go to ER or return to medical center if symptoms don't improve, worsen or new problems develop. The patient verbalized understanding. The patient was told to call to get lab results if they haven't heard anything  in the next week.   This note has been created with Surveyor, quantity. Any transcriptional errors are unintentional.   Maren Reamer, MD, [place] [facility] [place], [place]   12/06/2015, 5:17 PM

## 2015-12-06 NOTE — Progress Notes (Signed)
F/u dm Refill Metformin, Pravachol

## 2015-12-06 NOTE — Patient Instructions (Addendum)
Vacuna Tdap (contra la difteria, el ttanos y [place]): Lo que debe saber (Tdap Vaccine [Tetanus, Diphtheria, and Pertussis]: What You Need to Know) 1. Por qu vacunarse? El ttanos, la difteria y la tosferina son enfermedades muy graves. La vacuna Tdap nos puede proteger de estas enfermedades. Adems, la vacuna Tdap que se aplica a las Chemical engineer a los bebs recin nacidos contra la tosferina. En la actualidad, el [place] (trismo) es una enfermedad poco frecuente en los [place]. Provoca la contraccin y el endurecimiento dolorosos de los msculos, por lo general, de todo el cuerpo.  Puede causar el endurecimiento de los msculos de la cabeza y el cuello, de modo que impide abrir la boca, tragar y en algunos casos, Ambulance person. El ttanos causa la muerte de aproximadamente 1de cada 10personas que contraen la infeccin, incluso despus de que reciben la mejor atencin mdica. La DIFTERIA tambin es poco frecuente en los [place] [employer]. Puede causar la formacin de una membrana gruesa en la parte posterior de la garganta.  Esto tiene como consecuencia problemas respiratorios, insuficiencia cardaca, parlisis y [place]. La TOSFERINA (tos convulsa) provoca episodios de tos intensa que pueden dificultar la respiracin y provocar vmitos y trastornos del sueo.  Tambin puede causar prdida de peso, incontinencia y fractura de [place]. Dos de cada 100 adolescentes y 5 de cada 100 adultos con tosferina deben ser hospitalizados o tienen complicaciones, que podran incluir neumona y [place]. Estas enfermedades son provocadas por bacterias. La difteria y la tosferina se contagian de Ardelia Mems persona a otra a travs de las secreciones de la tos o el estornudo. El ttanos ingresa al organismo a travs de cortes, rasguos o heridas. Antes de las vacunas, en los [place] se informaban 200000 casos de difteria, 200000 casos de tosferina y cientos de casos de ttanos cada  ao. Desde el inicio de la vacunacin, los informes de casos de ttanos y difteria han disminuido alrededor del 99%, y de tosferina, alrededor del 80%. 2. Edward Jolly Tdap La vacuna Tdap protege a adolescentes y adultos contra el ttanos, la difteria y la tosferina. Una dosis de Tdap se administra a los 80 o 12 aos. Las [place] [employer] no recibieron la vacuna Tdap a esa edad deben recibirla tan pronto como sea posible. Es muy importante que los mdicos y todos aquellos que tengan contacto cercano con bebs menores de 62meses reciban la vacuna Tdap. Las mujeres deben recibir una dosis de Tdap en cada Media planner, para proteger al recin nacido de la tosferina. Los nios tienen mayor riesgo de complicaciones graves y potencialmente mortales debido a la tosferina. Otra vacuna llamada Td protege contra el ttanos y la difteria, pero no contra la tosferina. Todos deben recibir una dosis de refuerzo de Td cada 10 aos. La Tdap puede aplicarse como uno de estos refuerzos si nunca antes recibi esta vacuna. Tambin se puede aplicar despus de un corte o quemadura grave para prevenir la infeccin por ttanos. El mdico o la persona que le aplique la vacuna puede darle ms informacin al [employer]. La Tdap puede administrarse de manera segura simultneamente con otras vacunas. 3. Algunas personas no deben recibir la [employer] persona que alguna vez tuvo una reaccin alrgica potencialmente mortal a Ardelia Mems dosis previa de cualquier vacuna contra el ttanos, la difteria o la tosferina, O que tenga una alergia grave a cualquiera de los componentes de esta vacuna, no debe recibir la vacuna Tdap. Informe a la Web designer la vacuna si tiene  cualquier alergia grave.  Una persona que estuvo en estado de coma o sufri mltiples convulsiones en el trmino de los 7das despus de recibir una dosis de DTP o DTaP, o una dosis previa de Tdap, no debe recibir la vacuna Tdap, salvo que se haya encontrado otra causa que no fuera  la vacuna. An puede recibir la Td.  Consulte con su mdico si:  tiene convulsiones u otro problema del sistema nervioso,  tuvo hinchazn o dolor intenso despus de cualquier vacuna contra la difteria o el ttanos,  alguna vez ha sufrido el sndrome de [place],  no se siente Pharmacologist en que se ha programado la vacuna. 4. Riesgos Con cualquier medicamento, incluyendo las vacunas, existe la posibilidad de que aparezcan efectos secundarios. Suelen ser leves y desaparecen por s solos. Tambin son posibles las reacciones graves, pero en raras ocasiones. La [employer] de las personas a las que se les aplica la vacuna Tdap no tienen ningn problema. Problemas leves despus de la vacuna Tdap (No interfirieron en otras actividades)  Dolor en el lugar donde se aplic la vacuna (alrededor de 3 de cada 4 adolescentes o 2 de cada 3 adultos).  Enrojecimiento o hinchazn en el lugar donde se aplic la vacuna (1 de cada 5 personas).  Fiebre leve de al menos 100,50F (38C) (hasta alrededor de 1 cada 25 adolescentes o 1 de cada 100 adultos).  Dolor de cabeza (alrededor de 3 o 4 de cada 10 personas).  Cansancio (alrededor de 1 de cada 3 o 4 personas).  Nuseas, vmitos, diarrea, dolor de estmago (hasta 1 de cada 4 adolescentes o 1 de cada 10 adultos).  Escalofros, dolores articulares (alrededor de 1de cada 10personas).  Dolores corporales (alrededor de 1de cada 3 o 4personas).  Erupcin cutnea, inflamacin de los ganglios (poco frecuente). Problemas moderados despus de recibir la vacuna Tdap (Interfirieron en otras actividades, pero no requirieron atencin mdica)  Management consultant donde se aplic la vacuna (hasta 1de cada 5 o 6).  Enrojecimiento o inflamacin en el lugar donde se aplic la vacuna (hasta alrededor de 1 de cada 16adolescentes o 1 de cada 12adultos).  Fiebre de ms de 102F (38,8C) (alrededor de 1 de cada 100 adolescentes o 1 de cada 250  adultos).  Dolor de cabeza (alrededor de 1de cada 7adolescentes o 1de cada 10adultos).  Nuseas, vmitos, diarrea, dolor de estmago (hasta 1 o 3 de cada 100 personas).  Hinchazn de todo el brazo en el que se aplic la vacuna (hasta alrededor de 1de cada 500personas). Problemas graves despus de la vacuna Tdap (Impidieron Optometrist las actividades habituales; requirieron atencin mdica)  Inflamacin, dolor intenso, sangrado y enrojecimiento en el brazo en que se aplic la vacuna (poco frecuente). Problemas que podran ocurrir despus de cualquier vacuna:  Las personas a veces se desmayan despus de un procedimiento mdico, incluida la vacunacin. Si permanece sentado o recostado durante 15 minutos puede ayudar a Merrill Lynch y las lesiones causadas por las cadas. Informe al mdico si se siente mareado, tiene cambios en la visin o zumbidos en los odos.  Algunas personas sienten un dolor intenso en el hombro y tienen dificultad para mover el brazo donde se coloc la vacuna. Esto sucede con muy poca frecuencia.  Cualquier medicamento puede causar una reaccin alrgica grave. Dichas reacciones son Orlene Erm poco frecuentes con una vacuna (se calcula que menos de 1en un milln de dosis) y se producen de unos minutos a unas horas despus de Writer. Al  igual que con cualquier [employer], existe una probabilidad muy remota de que una vacuna cause una lesin grave o la [place]. Se controla permanentemente la seguridad de las vacunas. Para obtener ms informacin, visite: http://www.aguilar.org/. 5. Qu pasa si hay un problema grave? A qu signos debo estar atento?  Observe todo lo que le preocupe, como signos de una reaccin alrgica grave, fiebre muy alta o comportamiento fuera de lo normal.  Los signos de una reaccin alrgica grave pueden incluir ronchas, hinchazn de la cara y la garganta, dificultad para respirar, latidos cardacos acelerados, mareos y debilidad.  Generalmente, estos comenzaran entre unos pocos minutos y algunas horas despus de la vacunacin. Qu debo hacer?  Si usted piensa que se trata de una reaccin alrgica grave o de otra emergencia que no puede esperar, llame al 911 o lleve a la persona al hospital ms cercano. Sino, llame a su mdico.  Despus, la reaccin debe informarse al Sistema de Informacin sobre Efectos Adversos de las [place] (Vaccine Adverse Event Reporting System, VAERS). Su mdico puede presentar este informe, o puede hacerlo usted mismo a travs del sitio web de VAERS, en [email or web address], o llamando al [phone]. VAERS no brinda recomendaciones mdicas. 6. [place] Compensacin de Daos por [place] de Compensacin de Daos por Clinical biochemist (National Vaccine Injury Compensation Program, VICP) es un programa federal que fue creado para Patent examiner a las personas que puedan haber sufrido daos al recibir ciertas vacunas. Aquellas personas que consideren que han sufrido un dao como consecuencia de una vacuna y [place] saber ms acerca del programa y de cmo presentar Raechel [place], pueden llamar al [phone] o visitar su sitio web en [email or web address]. Hay un lmite de tiempo para presentar un reclamo de compensacin. 7. Cmo puedo obtener ms informacin?  Consulte a su mdico. Este puede darle el prospecto de la vacuna o recomendarle otras fuentes de informacin.  Comunquese con el servicio de salud de su localidad o su estado.  Comunquese con los Centros para Building surveyor y la Prevencin de Probation officer for Disease Control and Prevention , CDC).  Llame al [phone] (1-800-CDC-INFO), o  visite el sitio web [employer] en [email or web address]/. Declaracin de informacin sobre la vacuna contra la difteria, el ttanos y la tosferina (Tdap) de los CDC (24/02/15)   Esta informacin no tiene Marine scientist el consejo del mdico. Asegrese de hacerle  al mdico cualquier pregunta que tenga.   Document Released: 02/13/2012 Document Revised: 03/19/2014 Elsevier Interactive Patient Education 2016 [place] antineumoccica de polisacridos: Lo que debe saber (Pneumococcal Polysaccharide Vaccine: What You Need to Know) 1. Por qu vacunarse? La vacunacin puede proteger a los [employer] (y a algunos nios y adultos ms jvenes) de la enfermedad neumoccica. La enfermedad neumoccica es causada por una bacteria que puede contagiarse de [place] persona a otra por contacto directo. Puede provocar infecciones en los odos y tambin infecciones ms graves en:   los pulmones (neumona),  la sangre (bacteriemia), y  las membranas que cubren el cerebro y la mdula espinal (meningitis). La meningitis puede causar sordera y dao cerebral, y puede ser mortal. Cualquier persona puede contraer la enfermedad neumoccica, pero los nios menores de 2 aos, las personas con [employer], los adultos mayores de 17 aos y los fumadores son los que corren un mayor riesgo. Cada ao, mueren alrededor de [employer] a causa de la enfermedad [employer] [place]. El tratamiento de las infecciones neumoccicas con penicilina y otros medicamentos  era ms efectivo en el pasado. Sin embargo, algunas cepas de la bacteria que causa la enfermedad se han vuelto resistentes a estos medicamentos. Esto hace que la prevencin de la enfermedad mediante la vacunacin sea an ms importante. 2. Edward Jolly antineumoccica de polisacridos (PPSV23) La vacuna antineumoccica de polisacridos (PPSV23) protege contra 23tipos de bacterias neumoccicas. No previene todas las enfermedades neumoccicas. La vacuna PPSV23 se recomienda para las siguientes personas:  Todos los adultos de 65aos en adelante.  Cleora Fleet persona de 2a 64aos que tenga un problema de salud a Barrister's clerk.  [employer] de 2 a 10 aos que tengan el sistema inmunitario  dbil.  Los adultos de 19 a 64 aos que fumen o tengan asma. [place] solo necesitan una dosis de la vacuna PPSV. Para ciertos grupos de alto riesgo se recomienda una segunda dosis. Las The First American de 65 aos deben recibir una dosis de la vacuna, aun si recibieron una o ms dosis antes de [employer]. El mdico puede brindarle ms informacin sobre estas recomendaciones. La mayora de los adultos sanos adquiere proteccin 2 a 3semanas despus de haber recibido la vacuna. 3. Algunas personas no deben recibir la vacuna  Cleora Fleet persona que haya sufrido una reaccin alrgica potencialmente mortal a la PPSV no debe recibir otra dosis.  Las personas que tengan Buyer, retail grave a los componentes de la vacuna PPSV no deben recibirla. Informe a su mdico si sufre de algn tipo de alergia grave.  Cleora Fleet persona que est enferma en un grado moderado o grave el da en que est programada la vacunacin, probablemente deba esperar a recuperarse para recibirla. Por lo general, una persona con una enfermedad leve puede vacunarse.  Los nios menores de 2 aos no deben recibir [employer].  No hay pruebas de que la vacuna PPSV sea perjudicial para las mujeres embarazadas o el feto. No obstante, como precaucin, las mujeres que necesiten aplicarse la vacuna deben hacerlo antes de quedar embarazadas, si es posible. 4. Riesgos de [place] reaccin a la vacuna Con cualquier medicamento, incluyendo las vacunas, existe la posibilidad de que aparezcan efectos secundarios. Estos son leves y desaparecen por s solos, pero tambin son posibles las reacciones graves. Aproximadamente la mitad de las personas que reciben la PPSV presentan efectos secundarios leves, como enrojecimiento o Management consultant donde se aplic la vacuna, que desaparecen a [place]. Menos de 1 de cada 100personas presenta fiebre, dolores musculares o reacciones locales ms graves. Problemas que podran ocurrir despus de  cualquier vacuna:  Las personas a veces se desmayan despus de un procedimiento mdico, incluso despus de recibir Engineer, drilling. Si permanece sentado o recostado durante 15 minutos puede ayudar a Merrill Lynch y las lesiones causadas por las cadas. Informe al mdico si se siente mareado, tiene cambios en la visin o zumbidos en los odos.  Algunas personas sienten un dolor intenso en el hombro y tienen dificultad para mover el brazo donde se coloc la vacuna. Esto sucede con muy poca frecuencia.  Cualquier medicamento puede causar una reaccin alrgica grave. Dichas reacciones son Orlene Erm poco frecuentes con una vacuna (se calcula que menos de 1en un milln de dosis) y se producen unos minutos a unas horas despus de la vacunacin. Al igual que con cualquier [employer] Company, existe una probabilidad remota de que una vacuna cause una lesin grave o la [place]. Se controla permanentemente la seguridad de las vacunas. Para obtener ms informacin, visite: http://www.aguilar.[email or web address] 5. Qu pasa si hay Ardelia Mems  reaccin grave? A qu signos debo estar atento? Observe todo lo que le preocupe, como signos de una reaccin alrgica grave, fiebre muy alta o comportamiento fuera de lo normal.  Los signos de una reaccin alrgica grave pueden incluir ronchas, hinchazn de la cara y la garganta, dificultad para respirar, latidos cardacos acelerados, mareos y debilidad. Generalmente, estos comenzaran entre unos pocos minutos y algunas horas despus de la vacunacin. Qu debo hacer? Si usted piensa que se trata de una reaccin alrgica grave o de otra emergencia que no puede esperar, llame al 911 o dirjase al hospital ms cercano. Sino, llame a su mdico. Despus, la reaccin debe informarse al Sistema de Informacin sobre Efectos Adversos de las [place] (Vaccine Adverse Event Reporting System, VAERS). Su mdico puede presentar este informe, o puede hacerlo usted mismo a travs del sitio web de VAERS, en  [email or web address], o llamando al [phone].  VAERS no brinda recomendaciones mdicas. 6. Cmo puedo obtener ms informacin?  Consulte a su mdico. Este puede darle el prospecto de la vacuna o recomendarle otras fuentes de informacin.  Comunquese con el servicio de salud de su localidad o su estado.  Comunquese con los Centros para Building surveyor y la Prevencin de Probation officer for Disease Control and Prevention , CDC).  Llame al [phone] (1-800-CDC-INFO), o  Visite el sitio web de [employer] en [email or web address]/. Declaracin de informacin sobre la vacuna antineumoccica de polisacridos de los CDC (07/03/13)   Esta informacin no tiene Marine scientist el consejo del mdico. Asegrese de hacerle al mdico cualquier pregunta que tenga.   Document Released: 05/25/2008 Document Revised: 03/19/2014 Elsevier Interactive Patient Education 2016 [employer]. Influenza Virus Vaccine injection (Fluarix) Qu es este medicamento? La VACUNA ANTIGRIPAL ayuda a disminuir el riesgo de contraer la influenza, tambin conocida como la gripe. La vacuna solo ayuda a protegerle contra algunas cepas de influenza. Esta vacuna no ayuda a reducir Catering manager de contraer influenza pandmica H1N1. Este medicamento puede ser utilizado para otros usos; si tiene alguna pregunta consulte con su proveedor de atencin mdica o con su farmacutico. Qu le debo informar a mi profesional de la salud antes de tomar este medicamento? Necesita saber si usted presenta alguno de los siguientes problemas o situaciones: -trastorno de sangrado como hemofilia -fiebre o infeccin -sndrome de Guillain-Barre u otros problemas neurolgicos -problemas del sistema inmunolgico -infeccin por el virus de la inmunodeficiencia humana (VIH) o SIDA -niveles bajos de plaquetas en la sangre -esclerosis mltiple -una Risk analyst o inusual a las vacunas antigripales, a los huevos, protenas de pollo, al ltex, a  la gentamicina, a otros medicamentos, alimentos, colorantes o conservantes -si est embarazada o buscando quedar embarazada -si est amamantando a un beb Cmo debo utilizar este medicamento? Esta vacuna se administra mediante inyeccin por va intramuscular. Lo administra un profesional de [employer]	[place]. Recibir una copia de informacin escrita sobre la vacuna antes de cada vacuna. Asegrese de leer este folleto cada vez cuidadosamente. Este folleto puede cambiar con frecuencia. Hable con su pediatra para informarse acerca del uso de este medicamento en nios. Puede requerir atencin especial. Sobredosis: Pngase en contacto inmediatamente con un centro toxicolgico o una sala de urgencia si usted cree que haya tomado demasiado medicamento. ATENCIN: [employer] es solo para usted. No comparta este medicamento con nadie. Qu sucede si me olvido de una dosis? No se aplica en este caso. Qu puede interactuar con este medicamento? -quimioterapia o radioterapia -medicamentos que suprimen el sistema inmunolgico, tales como etanercept, anakinra, infliximab y  adalimumab -medicamentos que tratan o previenen cogulos sanguneos, como warfarina -fenitona -medicamentos esteroideos, como la prednisona o la cortisona -teofilina -vacunas Puede ser que esta lista no menciona todas las posibles interacciones. Informe a su profesional de [employer]	[place] de [employer] productos a base de hierbas, medicamentos de [place] o suplementos nutritivos que est tomando. Si usted fuma, consume bebidas alcohlicas o si utiliza drogas ilegales, indqueselo tambin a su profesional de [employer]	[place]. Algunas sustancias pueden interactuar con su medicamento. A qu debo estar atento al usar [employer]? Informe a su mdico o a Barrister's clerk de la [employer] todos los efectos secundarios que persistan despus de 3 das. Llame a su proveedor de atencin mdica si se presentan sntomas inusuales dentro de las 6 semanas  posteriores a la vacunacin. Es posible que todava pueda contraer la gripe, pero la enfermedad no ser tan fuerte como normalmente. No puede contraer la gripe de esta vacuna. La vacuna antigripal no le protege contra resfros u otras enfermedades que pueden causar [place]. Debe vacunarse cada ao. Qu efectos secundarios puedo tener al [employer] este medicamento? Efectos secundarios que debe informar a su mdico o a Barrister's clerk de la salud tan pronto como sea posible: -reacciones alrgicas como erupcin cutnea, picazn o urticarias, hinchazn de la cara, labios o lengua Efectos secundarios que, por lo general, no requieren atencin mdica (debe informarlos a su mdico o a su profesional de la salud si persisten o si son molestos): -fiebre -dolor de cabeza -molestias y dolores musculares -dolor, sensibilidad, enrojecimiento o Estate agent de la inyeccin -cansancio o debilidad Puede ser que esta lista no menciona todos los posibles efectos secundarios. Comunquese a su mdico por asesoramiento mdico [employer]. Usted puede informar los efectos secundarios a la FDA por telfono al 1-800-FDA-1088. Dnde debo guardar mi medicina? Esta vacuna se administra solamente en clnicas, farmacias, consultorio mdico u otro consultorio de un profesional de la salud y no Sports coach en su domicilio. ATENCIN: Este folleto es un resumen. Puede ser que no cubra toda la posible informacin. Si usted tiene preguntas acerca de esta medicina, consulte con su mdico, su farmacutico o su profesional de Technical sales engineer.    2016, Elsevier/Gold Standard. (2009-08-30 15:31:40)   - QUICK START PATIENT GUIDE TO LCHF/IF LOW CARB HIGH FAT / INTERMITTENT FASTING What is this diet and how does it work? o Insulin is a hormone made by your body that allows you to use sugar (glucose) from carbohydrates in the food you eat for energy or to store glucose (as fat) for future use  o Insulin  levels need to be lowered in order to utilize our stored energy (fat) o Many struggling with obesity are insulin resistant and have high levels of insulin o This diet works to lower your insulin in two ways o Fasting - allows your insulin levels to naturally decrease  o Avoiding carbohydrates - carbs trigger increase in insulin Low Carb Healthy Fat (LCHF) o Get a free app for your phone, such as MyFitnessPal, to help you track your macronutrients (carbs/protein/fats) and to track your weight and body measurements to see your progress o Set your goal for around 10% carbs/20% protein/70% fat o A good starting goal for amount of net carbs per day is 50 grams (some will aim for 20 grams) o "Net carbs" refers to total grams of carbs minus grams of fiber (as fiber is not typically absorbed). For example, if a food has 5g total carb and  3g fiber, that would be 2g net carbs o Increase healthy fats - eg. olive oil, eggs, nuts, avocado, cheese, butter, coconut, meats, fish o Avoid high carb foods - eg. bread, pasta, potatoes, rice, cookies, soda, juice, anything sugary o Buy full-fat ingredients (avoid low-fat versions, which often have more sugar) o No need to count calories, but pay close attention to grams of carbs on labels Intermittent Fasting (IF) o "Fasting" is going a period of time without eating - it helps to stay busy and well-hydrated o Purpose of fasting is to allow insulin levels to drop as low as possible, allowing your body to switch into fat-burning mode o With this diet there are many approaches to fasting, but 16:8 and 24hr fasts are commonly used o 16:8 fast, usually 5-7 days a week - Fasting for 16 hours of the day, then eating all meals for the day over course of 8 hours. o 24 hour fast, usually 1-3 days a week - Typically eating one meal a day, then fasting until the next day. Plenty of fluids (and some salt to help you hold onto fluids) are recommended during longer fasts.  o During  fasts certain beverages are still acceptable - water, sparkling water, bone broth, black tea or coffee, or tea/coffee with small amount of heavy whipping cream Special note for those on diabetic medications o Discuss your medications with your physician. You may need to hold your medication or adjust to only taking when eating. Diabetics should keep close track of their blood sugars when making any changes to diet/meds, to ensure they are staying within normal limits For more info about LCHF/IF o Watch "Therapeutic Fasting - Solving the Two-Compartment Problem" video by Dr. Sharman Cheek on YouTube ([email or web address]) for a great intro to these concepts o Read "The Obesity Code" and/or "The Complete Guide to Fasting" by Dr. Sharman Cheek o Go to www.dietdoctor.com for explanations, recipes, and infographics about foods to eat/avoid EXAMPLES TO GET STARTED Fasting Beverages -water (can add  tsp [place] salt once or twice a day to help stay hydrated for longer fasts) -Sparkling water (such as [employer] or similar; avoid any with artificial sweeteners)  -Bone broth (multiple recipes available online or can buy pre-made) -Tea or Coffee (Adding heavy whipping cream or coconut oil to your tea or coffee can be helpful if you find yourself getting too hungry during the fasts. Can also add cinnamon for flavor. Or "bulletproof coffee.") Low Carb Healthy Fat Breakfast (if not fasting) -eggs in butter or olive oil with avocado -omelet with veggies and cheese  Lunch -hamburger with cheese and avocado wrapped in lettuce (no bun, no ketchup) -meat and cheese wrapped in lettuce (can dip in mustard or olive oil/vinegar/mayo) -salad with meat/cheese/nuts and higher fat dressing (vinaigrette or Ranch, etc) -tuna salad lettuce wrap -taco meat with cheese, sour cream, guacamole, cheese over lettuce  Dinner -steak with herb butter or Barnaise sauce -"Fathead" pizza (uses cheese and  almond flour for the dough - several recipes available online) -roasted or grilled chicken with skin on, with low carb sauce (buffalo, garlic butter, alfredo, pesto, etc) -baked salmon with lemon butter -chicken alfredo with zucchini noodles -[place] butter chicken with low carb garlic naan -egg roll in a bowl  Side Dishes -mashed cauliflower (homemade or available in freezer section) -roast vegetables (green veggies that grow above ground rather than root veggies) with butter or cheese -Caprese salad (fresh mozzarella, tomato and basil with olive oil) -homemade low-carb coleslaw Snacks/Desserts (  try to avoid unnecessary snacking and sweets in general) -celery or cucumber dipped in guacamole or sour cream dip -cheese and meat slices  -raspberries with whipped cream (can make homemade with no sugar added) -low carb [place] butter cake  AVOID - sugar, diet/regular soda, potatoes, breads, rice, pasta, candy, cookies, cakes, muffins, juice, high carb fruit (bananas, grapes), beer, ketchup, barbeque and other sweet sauces

## 2015-12-21 ENCOUNTER — Ambulatory Visit (INDEPENDENT_AMBULATORY_CARE_PROVIDER_SITE_OTHER): Payer: Self-pay | Admitting: Orthopedic Surgery

## 2015-12-21 DIAGNOSIS — M25561 Pain in right knee: Secondary | ICD-10-CM

## 2015-12-21 DIAGNOSIS — M25562 Pain in left knee: Secondary | ICD-10-CM

## 2015-12-23 ENCOUNTER — Ambulatory Visit: Payer: No Typology Code available for payment source | Admitting: Family Medicine

## 2015-12-30 ENCOUNTER — Encounter: Payer: Self-pay | Admitting: Family Medicine

## 2015-12-30 ENCOUNTER — Ambulatory Visit (INDEPENDENT_AMBULATORY_CARE_PROVIDER_SITE_OTHER): Payer: Self-pay | Admitting: Family Medicine

## 2015-12-30 VITALS — BP 141/78 | Ht 67.0 in | Wt 271.0 lb

## 2015-12-30 DIAGNOSIS — S86012S Strain of left Achilles tendon, sequela: Secondary | ICD-10-CM | POA: Insufficient documentation

## 2015-12-30 DIAGNOSIS — M170 Bilateral primary osteoarthritis of knee: Secondary | ICD-10-CM

## 2015-12-30 MED FILL — NITROGLYCERIN 0.2 MG/HR PAT: 0.2 | 28 days supply | Qty: 7 | Fill #3

## 2015-12-30 MED FILL — PRAVASTATIN NA 20 MG TAB: 20 | 30 days supply | Qty: 30 | Fill #0

## 2015-12-30 MED FILL — METFORMIN HCL ER 500 MG TAB: 500 | 30 days supply | Qty: 30 | Fill #0

## 2015-12-30 NOTE — Progress Notes (Signed)
  Amanda Santiago - 50 y.o. female MRN [id number]  Date of birth: December 22, 1965  SUBJECTIVE:  Including CC & ROS.  Chief Complaint  Patient presents with  . Ankle Pain    partial achilles tear     A Spanish interpreter was used during the entirety of this visit.  Amanda Santiago is a 50 yo F that is following up for her achilles pain. She had an MRI that showed mild tearing. She has been started on nitroglycerin patches and be placed in orthotics. She reports significant improvement with the nitroglycerin. She reports the pain is 4 out of 10. She can walk normally now. She continues to lose weight with diet neck size.  She is also presenting with bilateral knee pain. She has seen Dr. Marlou Sa at [place]. He has encouraged her to continue to lose weight. She may be a candidate for visco supplementation in the future.  ROS: No unexpected weight loss, fever, chills, swelling, instability, numbness/tingling, redness, otherwise see HPI   HISTORY: Past Medical, Surgical, Social, and Family History Reviewed & Updated per EMR.   Pertinent Historical Findings include: PMSHx -  morbid obesity  DATA REVIEWED: 04/12/14: MRI left ankle: Tendinopathy and potential mild partial tearing of the anterior distal portion of the Achilles tendon with mild pre Achilles bursitis. 03/31/14: left knee x-ray: Progressive tricompartment osteoarthritis. 03/31/14: right knee x-ray: Moderate osteoarthritis in the right knee. No acute bone abnormality.  PHYSICAL EXAM:  VS: BP:(!) 141/78  HR: bpm  TEMP: ( )  RESP:   HT:5\' 7"  (170.2 cm)   WT:271 lb (122.9 kg)  BMI:42.5 PHYSICAL EXAM: Gen: NAD, alert, cooperative with exam,  HEENT: clear conjunctiva, EOMI CV:  no edema, capillary refill brisk,  Resp: non-labored, normal speech Skin: no rashes, normal turgor  Neuro: no gross deficits.  Psych:  alert and oriented Left achilles:  Little to no tenderness to palpation over the Achilles. No obvious  swelling. Normal range of motion in all directions. Normal strength. Normal gait. Knee exam:  No obvious effusion bilateral knees. Symptoms to palpation over the medial compartment bilaterally. Normal range of motion bilaterally. Normal strength bilaterally. Negative McMurray's test bilaterally. Neurovascular intact.   ASSESSMENT & PLAN:   Morbid obesity ([place]) Excellent weight loss. High weight of 3:30 now down to 271.  Achilles tendon tear, left, sequela Continue nitroglycerin patch another 4-6 weeks in follow-up. At that point we may be able to discontinue. He probably need some rehabilitation with eccentric Achilles strengthening exercises.  DJD (degenerative joint disease) of knee Seen by orthopedics and they have nothing additional to offer at this time. We'll continue corticosteroid injections through our office.

## 2015-12-30 NOTE — Assessment & Plan Note (Signed)
Excellent weight loss. High weight of 3:30 now down to 271.

## 2015-12-30 NOTE — Progress Notes (Signed)
[facility]: Attending Note: I have reviewed the chart, discussed wit the Sports Medicine Fellow. I agree with assessment and treatment plan as detailed in the [place] note. 1. Achilles partial tear with pain:  Significant improvement in her Achilles pain . No problems with the nitroglycerin patch. I think we would be best served by doing another 4 to potentially 8 weeks of nitroglycerin patch therapy. Like to see her back in 4-6 weeks. #2. Bilateral knee pain: We gave her a steroid shot 2 months ago. She still doing fairly well. We can certainly inject her again when I see her back if she needs injection therapy #3. Morbid obesity. Her highest weight was 330 and she's now down to 271. Significant improvement. Congratulated her and encouraged her to continue to work.

## 2015-12-30 NOTE — Assessment & Plan Note (Signed)
Seen by orthopedics and they have nothing additional to offer at this time. We'll continue corticosteroid injections through our office.

## 2015-12-30 NOTE — Assessment & Plan Note (Signed)
Continue nitroglycerin patch another 4-6 weeks in follow-up. At that point we may be able to discontinue. He probably need some rehabilitation with eccentric Achilles strengthening exercises.

## 2016-02-10 ENCOUNTER — Ambulatory Visit (INDEPENDENT_AMBULATORY_CARE_PROVIDER_SITE_OTHER): Payer: Self-pay | Admitting: Family Medicine

## 2016-02-10 ENCOUNTER — Encounter: Payer: Self-pay | Admitting: Family Medicine

## 2016-02-10 DIAGNOSIS — M170 Bilateral primary osteoarthritis of knee: Secondary | ICD-10-CM

## 2016-02-10 MED ORDER — METHYLPREDNISOLONE ACETATE 40 MG/ML IJ SUSP
40.0000 mg | Freq: Once | INTRAMUSCULAR | Status: AC
  Administered 2016-02-10: 40 mg via INTRA_ARTICULAR

## 2016-02-10 NOTE — Progress Notes (Signed)
  Amanda Santiago - 50 y.o. female MRN [id number]  Date of birth: 07-May-1965  SUBJECTIVE:  Including CC & ROS.  CC: f/u left achilles pain and bilateral knee pain   Spanish interpreter used.  Amanda Santiago is a 50yo female with hx of left achilles tear and bilateral knee DJD presenting for follow up today. Pt reports that since last being seen she had improvement of her left achilles pain with using the nitroglycerin patches. Last week she she tripped on her shoelace and this exacerbated the achilles pain. She rates the current pain at 4/10. It is present when she inverts her left foot and walks. She has continued to use the nitro patches with relief.  Pt reports that she still has bilateral knee pain. The pain is constant. Walking and weight bearing aggravates the pain. She reports that she filled out paper work for visco injections with her orthopedic physician and they told her that she could get them but should continue to get the cortisone injections. She reports that the cortisone injections help for 1.5 months and then the symptoms return. Her last injection was done 12/30/15.  ROS:  No gait abnormalities No swelling No redness No weakness  No fever No chills      HISTORY: Past Medical, Surgical, Social, and Family History Reviewed & Updated per EMR.   Pertinent Historical Findings include: PMSHx -  Obesity, Left achilles tear, DJD, DM Medications - nitroglycerin patches, metformin, gabapentin   PHYSICAL EXAM:  VS: BP:(!) 145/76  HR: bpm  TEMP: ( )  RESP:   HT:5\' 6"  (167.6 cm)   WT:265 lb (120.2 kg)  BMI:42.9 PHYSICAL EXAM: Gen: NAD, alert, cooperative with exam, well-appearing Skin: no rashes, normal turgor  Neuro: no gross deficits.   Knees: Normal to inspection with no erythema or effusion or obvious bony abnormalities. no warmth Tenderness to palpation of medial aspect of bilateral knees ROM full in flexion and rotation. Limited extension  ROM. Ligaments with solid consistent endpoints including ACL, PCL, LCL, MCL. Negative Mcmurray's  Left Achilles: No erythema or effusion Mild tenderness to palpation along medial aspect of achilles ROM with plantar, dorsiflexion, inversion and eversion Nl strength    ASSESSMENT & PLAN:  DJD:  -Cortisone injections of bilateral knee today -Congratulated patient on weight loss and encouraged patient to continue with exercise.   Left achilles tear: -Continue nitroglycerin patches for additional 4-6 weeks. -Continue at home exercises for achilles strengthening  -Follow up 4-6 weeks

## 2016-02-12 NOTE — Progress Notes (Signed)
[facility] Attending Note: I have seen and examined this patient with the medical student. I have  reviewed the history, physical examination, assessment and plan as documented in the medical student's note.  I agree with the medical student's note and findings, assessment and treatment plan as documented with the following additions or changes:  INJECTION: Patient was given informed consent, signed copy in the chart. Appropriate time out was taken. Area prepped and draped in usual sterile fashion. 1 cc of methylprednisolone 40 mg/ml plus  4 cc of 1% lidocaine without epinephrine was injected into the Bilateral knees using a(n) anterior medialapproach. The patient tolerated the procedure well. There were no complications. Post procedure instructions were given.   Also EXCELLENT weight loss!

## 2016-03-08 MED FILL — METFORMIN HCL ER 500 MG TAB: 500 | 15 days supply | Qty: 30 | Fill #1

## 2016-03-08 MED FILL — NITROGLYCERIN 0.2 MG/HR PAT: 0.2 | 8 days supply | Qty: 2 | Fill #4

## 2016-03-16 ENCOUNTER — Ambulatory Visit: Payer: Self-pay | Admitting: Family Medicine

## 2016-03-20 ENCOUNTER — Ambulatory Visit: Payer: Self-pay | Attending: Internal Medicine

## 2016-03-24 ENCOUNTER — Emergency Department (HOSPITAL_COMMUNITY): Payer: Self-pay

## 2016-03-24 ENCOUNTER — Emergency Department (HOSPITAL_COMMUNITY)
Admission: EM | Admit: 2016-03-24 | Discharge: 2016-03-25 | Disposition: A | Discharge status: Home / Self Care | Payer: Self-pay | Attending: Emergency Medicine | Admitting: Emergency Medicine

## 2016-03-24 DIAGNOSIS — R1011 Right upper quadrant pain: Secondary | ICD-10-CM | POA: Insufficient documentation

## 2016-03-24 DIAGNOSIS — Z87891 Personal history of nicotine dependence: Secondary | ICD-10-CM | POA: Insufficient documentation

## 2016-03-24 DIAGNOSIS — E119 Type 2 diabetes mellitus without complications: Secondary | ICD-10-CM | POA: Insufficient documentation

## 2016-03-24 DIAGNOSIS — Z79899 Other long term (current) drug therapy: Secondary | ICD-10-CM | POA: Insufficient documentation

## 2016-03-24 DIAGNOSIS — Z7984 Long term (current) use of oral hypoglycemic drugs: Secondary | ICD-10-CM | POA: Insufficient documentation

## 2016-03-24 LAB — URINALYSIS, ROUTINE W REFLEX MICROSCOPIC
Bacteria, UA: NONE SEEN
Bilirubin Urine: NEGATIVE
Glucose, UA: NEGATIVE mg/dL
KETONES UR: NEGATIVE mg/dL
LEUKOCYTES UA: NEGATIVE
Nitrite: NEGATIVE
PH: 7 (ref 5.0–8.0)
Protein, ur: NEGATIVE mg/dL
Specific Gravity, Urine: 1.008 (ref 1.005–1.030)

## 2016-03-24 LAB — CBC
HEMATOCRIT: 43.1 % (ref 36.0–46.0)
Hemoglobin: 14.5 g/dL (ref 12.0–15.0)
MCH: 29.8 pg (ref 26.0–34.0)
MCHC: 33.6 g/dL (ref 30.0–36.0)
MCV: 88.5 fL (ref 78.0–100.0)
Platelets: 277 10*3/uL (ref 150–400)
RBC: 4.87 MIL/uL (ref 3.87–5.11)
RDW: 15 % (ref 11.5–15.5)
WBC: 9.6 10*3/uL (ref 4.0–10.5)

## 2016-03-24 LAB — COMPREHENSIVE METABOLIC PANEL
ALT: 25 U/L (ref 14–54)
AST: 23 U/L (ref 15–41)
Albumin: 3.8 g/dL (ref 3.5–5.0)
Alkaline Phosphatase: 84 U/L (ref 38–126)
Anion gap: 11 (ref 5–15)
BILIRUBIN TOTAL: 0.6 mg/dL (ref 0.3–1.2)
BUN: 8 mg/dL (ref 6–20)
CO2: 25 mmol/L (ref 22–32)
Calcium: 8.9 mg/dL (ref 8.9–10.3)
Chloride: 103 mmol/L (ref 101–111)
Creatinine, Ser: 0.41 mg/dL — ABNORMAL LOW (ref 0.44–1.00)
Glucose, Bld: 92 mg/dL (ref 65–99)
POTASSIUM: 4 mmol/L (ref 3.5–5.1)
Sodium: 139 mmol/L (ref 135–145)
Total Protein: 8.5 g/dL — ABNORMAL HIGH (ref 6.5–8.1)

## 2016-03-24 LAB — LIPASE, BLOOD: LIPASE: 26 U/L (ref 11–51)

## 2016-03-24 LAB — PREGNANCY, URINE: Preg Test, Ur: NEGATIVE

## 2016-03-24 MED ORDER — ONDANSETRON HCL 4 MG/2ML IJ SOLN: 4.0000 mg | Freq: Four times a day (QID) | INTRAMUSCULAR | Status: DC | PRN

## 2016-03-24 MED ORDER — KETOROLAC TROMETHAMINE 15 MG/ML IJ SOLN
15.0000 mg | Freq: Once | INTRAMUSCULAR | Status: AC
  Administered 2016-03-24: 15 mg via INTRAMUSCULAR
  Filled 2016-03-24: qty 1

## 2016-03-24 MED ORDER — ONDANSETRON 4 MG PO TBDP: 4.0000 mg | ORAL_TABLET | Freq: Three times a day (TID) | ORAL | Status: DC | PRN

## 2016-03-24 NOTE — ED Notes (Signed)
Patient transported to Ultrasound 

## 2016-03-24 NOTE — ED Triage Notes (Signed)
Pt reports RUQ pain into the right flank area. Pt reports she was told last time it was her gall bladder. Pt reports nausea with no vomiting.

## 2016-03-24 NOTE — Discharge Instructions (Addendum)
Please follow-up with your primary care provider on Monday inform them of today's visit and all relevant data. Please inform them that you will need follow-up CT scan for further clarification of liver finding. Please use medication prescribed only for severe pain as needed.

## 2016-03-24 NOTE — ED Notes (Signed)
Pain in abdomen for 1 year but pain got significantly worse today

## 2016-03-24 NOTE — ED Provider Notes (Signed)
[facility] DEPT Provider Note   CSN: [id number] Arrival date & time: 03/24/16  1357     History   Chief Complaint Chief Complaint  Patient presents with  . Abdominal Pain  . Flank Pain    HPI Amanda Santiago is a 51 y.o. female presenting with sharp right upper quadrant pain which has been waxing and waning for the past year but has been constant and more intense since yesterday morning when she woke up. She explains that she felt the pain upon awakening and it radiates to her right flank and back. She also experiences episodes that feel like something is kicking in her stomach at times. She has tried ibuprofen which has worked in the past but was unsuccessful in relieving the pain. After eating breakfast she felt nauseated and the pain got worse. She has been having difficulty eating since it makes her nauseated and worsens her pain. She reports drinking fluids well. She found some relief with diclofenac. The pain is made worse with eating and certain movements. Nothing seems to really make it better. She endorses some chills, denies vomiting, fever, diarrhea, black tarry stool, blood in her stool, dysuria, hematuria, vaginal bleeding, shortness of breath, cough, chest pain or other associated symptoms. No use of antipyretics today.  Patient understands some English but had her son in the room helping translate.  HPI  Past Medical History:  Diagnosis Date  . Arthritis   . Diabetes mellitus without complication ([place])    takes glucaphage  . Kidney stone 9-10 years ago    Patient Active Problem List   Diagnosis Date Noted  . Achilles tendon tear, left, sequela 12/30/2015  . Diabetes mellitus without complication ([place]) [id number]  . Sternal pain 06/21/2014  . Morbid obesity ([place]) 04/06/2014  . Pain in joint, ankle and foot 04/06/2014  . DJD (degenerative joint disease) of knee 04/06/2014    Past Surgical History:  Procedure Laterality Date  . CESAREAN SECTION    .  LITHOTRIPSY      OB History    Gravida Para Term Preterm AB Living   7 2 2   5 2    SAB TAB Ectopic Multiple Live Births   5               Home Medications    Prior to Admission medications   Medication Sig Start Date End Date Taking? Authorizing Provider  carbamide peroxide (DEBROX) 6.5 % otic solution Place 5 drops into the right ear 2 (two) times daily. 12/06/15   Maren Reamer, MD  clotrimazole (LOTRIMIN) 1 % cream Apply 1 application topically 2 (two) times daily. Patient not taking: Reported on 03/08/2014 10/22/13   Lance Bosch, NP  gabapentin (NEURONTIN) 300 MG capsule Take 1 capsule (300 mg total) by mouth 3 (three) times daily. 12/06/15   Maren Reamer, MD  metFORMIN (GLUCOPHAGE-XR) 500 MG 24 hr tablet Take 1 tablet (500 mg total) by mouth daily with breakfast. 12/06/15   Maren Reamer, MD  nitroGLYCERIN (NITRODUR - DOSED IN MG/24 HR) 0.2 mg/hr patch Cut patch into one - fourth pieces Place a one fourth piece of patch on  skin over affected area, changing to a new piece every 24 hours. 10/21/15   Dickie La, MD  nystatin (MYCOSTATIN/NYSTOP) 100000 UNIT/GM POWD Use twice daily in skin folds 10/22/13   Lance Bosch, NP  pravastatin (PRAVACHOL) 20 MG tablet Take 1 tablet (20 mg total) by mouth daily. 12/06/15   Dawn T  Langeland, MD  Specialty Vitamins Products (WEIGHT LOSS DAILY MULTI) TABS Take 1 tablet by mouth 4 (four) times daily. Reported on 08/29/2015 08/13/13   Historical Provider, MD    Family History Family History  Problem Relation Age of Onset  . Diabetes Mother   . Hyperlipidemia Mother   . Hypertension Mother   . Colon cancer Maternal Grandmother   . Esophageal cancer Neg Hx   . Rectal cancer Neg Hx   . Stomach cancer Neg Hx     Social History Social History  Substance Use Topics  . Smoking status: Former Smoker    Packs/day: 1.50    Years: 18.00    Types: Cigarettes    Quit date: 02/09/1998  . Smokeless tobacco: Former Systems developer    Quit date: 03/12/1997   . Alcohol use No     Comment: rare     Allergies   Aspirin; Morphine and related; and Shellfish-derived products   Review of Systems Review of Systems  Constitutional: Positive for appetite change and chills. Negative for fever.  HENT: Negative for ear pain, sore throat and trouble swallowing.   Eyes: Negative for pain and visual disturbance.  Respiratory: Negative for cough, chest tightness, shortness of breath and wheezing.   Cardiovascular: Negative for chest pain, palpitations and leg swelling.  Gastrointestinal: Positive for abdominal pain and nausea. Negative for abdominal distention, blood in stool, diarrhea and vomiting.       Right upper quadrant pain radiating to her right flank and back  Genitourinary: Positive for flank pain. Negative for decreased urine volume, difficulty urinating, dysuria, frequency, hematuria, pelvic pain, urgency, vaginal bleeding and vaginal discharge.  Musculoskeletal: Positive for neck pain. Negative for arthralgias, back pain and neck stiffness.       Patient reports discomfort on the right side of her neck when she rubs it. Otherwise no neck pain or stiffness.  Skin: Negative for color change, pallor and rash.  Neurological: Negative for dizziness, seizures, syncope and headaches.  All other systems reviewed and are negative.    Physical Exam Updated Vital Signs BP 120/65   Pulse 71   Temp 97.6 F (36.4 C) (Oral)   Resp 18   SpO2 98%   Physical Exam  Constitutional: She appears well-developed and well-nourished. No distress.  Afebrile, nontoxic appearing, lying comfortably in bed in no acute distress  HENT:  Head: Normocephalic and atraumatic.  Eyes: Conjunctivae and EOM are normal.  Neck: Neck supple.  Cardiovascular: Normal rate, regular rhythm, normal heart sounds and intact distal pulses.   No murmur heard. Pulmonary/Chest: Effort normal and breath sounds normal. No respiratory distress. She has no wheezes. She has no rales.    Abdominal: Soft. Bowel sounds are normal. She exhibits no distension and no mass. There is tenderness. There is no guarding.  Patient's abdomen is soft, nondistended, and patient is exhibiting tenderness on palpation of right upper quadrant. Positive Murphy's sign. Negative McBurney's point tenderness. Patient is not tender to any other area of the abdomen.  Musculoskeletal: Normal range of motion. She exhibits no edema.  Neurological: She is alert.  Patient was observed walking to the bathroom.  Skin: Skin is warm and dry. She is not diaphoretic.  Psychiatric: She has a normal mood and affect. Her behavior is normal.  Nursing note and vitals reviewed.    ED Treatments / Results  Labs (all labs ordered are listed, but only abnormal results are displayed) Labs Reviewed  COMPREHENSIVE METABOLIC PANEL - Abnormal; Notable for the following:  Result Value   Creatinine, Ser 0.41 (*)    Total Protein 8.5 (*)    All other components within normal limits  URINALYSIS, ROUTINE W REFLEX MICROSCOPIC - Abnormal; Notable for the following:    Hgb urine dipstick SMALL (*)    Squamous Epithelial / LPF 0-5 (*)    All other components within normal limits  LIPASE, BLOOD  CBC    EKG  EKG Interpretation None       Radiology No results found.  Procedures Procedures (including critical care time)  Medications Ordered in ED Medications  ondansetron (ZOFRAN) injection 4 mg (not administered)     Initial Impression / Assessment and Plan / ED Course  I have reviewed the triage vital signs and the nursing notes.  Pertinent labs & imaging results that were available during my care of the patient were reviewed by me and considered in my medical decision making (see chart for details).  Clinical Course    51 year old female with history of right upper quadrant pain waxing and waning over the past year presenting with worsening of pain intensity and now constant. She is also experiencing  nausea at this time and the pain is radiating to her flank and back which is new.  Afebrile, non-toxic appearing in no acute distress. No antipyretic use today. Labs were unremarkable. Ordered RUQ [place]  Patient was comfortable lying in bed. She denied zofran or pain medications.  Patient care was transferred at end of shift to [facility] pending [place] results and disposition depending on ultrasound findings. Also on the differential is nephrolithiasis with patient reporting remote hx requiring lithotripsy. Discussed this with Dellis Filbert as well.  Patient was was also discussed with Dr. Winfred Leeds who agreed with assessment and plan.   Final Clinical Impressions(s) / ED Diagnoses   Final diagnoses:  Right upper quadrant pain    New Prescriptions New Prescriptions   No medications on file     Dossie Der 03/24/16 2039    Orlie Dakin, MD 03/25/16 [phone]

## 2016-03-25 ENCOUNTER — Emergency Department (HOSPITAL_COMMUNITY): Payer: Self-pay

## 2016-03-25 MED ORDER — TRAMADOL HCL 50 MG PO TABS: 50.0000 mg | ORAL_TABLET | Freq: Four times a day (QID) | ORAL | 0 refills | Status: DC | PRN

## 2016-03-25 NOTE — ED Notes (Signed)
Patient transported to CT 

## 2016-03-29 ENCOUNTER — Inpatient Hospital Stay: Payer: Self-pay

## 2016-04-04 ENCOUNTER — Encounter: Payer: Self-pay | Admitting: Internal Medicine

## 2016-04-04 ENCOUNTER — Ambulatory Visit: Payer: Self-pay | Attending: Internal Medicine | Admitting: Internal Medicine

## 2016-04-04 VITALS — BP 124/77 | HR 58 | Temp 97.8°F | Resp 16 | Wt 269.4 lb

## 2016-04-04 DIAGNOSIS — E119 Type 2 diabetes mellitus without complications: Secondary | ICD-10-CM

## 2016-04-04 DIAGNOSIS — Z888 Allergy status to other drugs, medicaments and biological substances status: Secondary | ICD-10-CM | POA: Insufficient documentation

## 2016-04-04 DIAGNOSIS — Z79899 Other long term (current) drug therapy: Secondary | ICD-10-CM | POA: Insufficient documentation

## 2016-04-04 DIAGNOSIS — R1011 Right upper quadrant pain: Secondary | ICD-10-CM

## 2016-04-04 DIAGNOSIS — K219 Gastro-esophageal reflux disease without esophagitis: Secondary | ICD-10-CM

## 2016-04-04 DIAGNOSIS — Z7984 Long term (current) use of oral hypoglycemic drugs: Secondary | ICD-10-CM | POA: Insufficient documentation

## 2016-04-04 DIAGNOSIS — Z87442 Personal history of urinary calculi: Secondary | ICD-10-CM | POA: Insufficient documentation

## 2016-04-04 DIAGNOSIS — K760 Fatty (change of) liver, not elsewhere classified: Secondary | ICD-10-CM

## 2016-04-04 DIAGNOSIS — R160 Hepatomegaly, not elsewhere classified: Secondary | ICD-10-CM

## 2016-04-04 LAB — GLUCOSE, POCT (MANUAL RESULT ENTRY): POC GLUCOSE: 97 mg/dL (ref 70–99)

## 2016-04-04 MED ORDER — PANTOPRAZOLE SODIUM 40 MG PO TBEC: 40.0000 mg | DELAYED_RELEASE_TABLET | Freq: Two times a day (BID) | ORAL | 1 refills | Status: DC

## 2016-04-04 MED ORDER — DICLOFENAC SODIUM 1 % TD GEL: 2.0000 g | Freq: Four times a day (QID) | TRANSDERMAL | 2 refills | Status: DC

## 2016-04-04 MED ORDER — GABAPENTIN 300 MG PO CAPS: 300.0000 mg | ORAL_CAPSULE | Freq: Three times a day (TID) | ORAL | 3 refills | Status: DC

## 2016-04-04 MED ORDER — METFORMIN HCL ER 500 MG PO TB24: 500.0000 mg | ORAL_TABLET | Freq: Every day | ORAL | 3 refills | Status: DC

## 2016-04-04 MED ORDER — PRAVASTATIN SODIUM 20 MG PO TABS: 20.0000 mg | ORAL_TABLET | Freq: Every day | ORAL | 3 refills | Status: DC

## 2016-04-04 MED FILL — METFORMIN HCL ER 500 MG TAB: 500 | 30 days supply | Qty: 30 | Fill #0

## 2016-04-04 MED FILL — PRAVASTATIN NA 20 MG TAB: 20 | 30 days supply | Qty: 30 | Fill #0

## 2016-04-04 MED FILL — VOLTAREN 1% GEL: 1 | 22 days supply | Qty: 100 | Fill #0

## 2016-04-04 MED FILL — GABAPENTIN 300 MG CAPSULE: 300 | 30 days supply | Qty: 90 | Fill #0

## 2016-04-04 MED FILL — ?PANTOPRAZOLE SOD DR 40MG: 40 MG | 30 days supply | Qty: 60 | Fill #0

## 2016-04-04 NOTE — Patient Instructions (Addendum)
Opciones de alimentos para pacientes con reflujo gastroesofgico - Adultos (Food Choices for Gastroesophageal Reflux Disease, Adult) Cuando se tiene reflujo gastroesofgico (ERGE), los alimentos que se ingieren y los hbitos de alimentacin son muy importantes. Elegir los alimentos adecuados puede ayudar a Public house manager las molestias ocasionadas por el Vaiden. Earle?  Elija las frutas, los vegetales, los cereales integrales, los productos lcteos, la carne de [place], de pescado y de ave con bajo contenido de grasas.  Limite las grasas, como los [place], los aderezos para [place], la [place], los frutos secos y Publishing copy.  Lleve un registro de las comidas para identificar los alimentos que ocasionan sntomas.  Evite los alimentos que le ocasionen reflujo. Pueden ser distintos para cada persona.  Haga comidas pequeas con frecuencia en lugar de tres comidas Kellogg.  Coma lentamente, en un clima distendido.  Limite el consumo de alimentos fritos.  Cocine los alimentos utilizando mtodos que no sean la fritura.  Evite el consumo alcohol.  Evite beber grandes cantidades de lquidos con las comidas.  Evite agacharse o recostarse hasta despus de 2 o 3horas de haber comido. QU ALIMENTOS NO SE RECOMIENDAN? Los siguientes son algunos alimentos y bebidas que pueden empeorar los sntomas: Actor. Jugo de tomate. Salsa de tomate y espagueti. Ajes. [place] y [place]. Rbano picante. Frutas  Naranjas, pomelos y limn (fruta y [place]). Carnes  Carnes de [place], de pescado y de ave con gran contenido de grasas. Esto incluye los perros calientes, las [place], el [place], la salchicha, el salame y el tocino. Lcteos  Leche entera y [place]. Rite Aid. Crema. [place]. Helados. Queso crema. Bebidas  Caf y t negro, con o sin cafena Bebidas gaseosas o energizantes. Condimentos  Salsa picante. Salsa barbacoa. Dulces/postres   Chocolate y cacao. Rosquillas. Menta y mentol. Grasas y [employer] Energy con alto contenido de grasas, incluidas las papas fritas. Otros  [place]. Especias picantes, como la Solectron Corporation, la pimienta blanca, la pimienta roja, la pimienta de cayena, el curry en [place], los clavos de [place], el jengibre y el [place] en polvo. Los artculos mencionados arriba pueden no ser Dean Foods Company de las bebidas y los alimentos que se Higher education careers adviser. Comunquese con el nutricionista para recibir ms informacin.  Esta informacin no tiene Marine scientist el consejo del mdico. Asegrese de hacerle al mdico cualquier pregunta que tenga. Document Released: 12/06/2004 Document Revised: 03/19/2014 Document Reviewed: 12/31/2012 Elsevier Interactive Patient Education  2017 [place] graso (Fatty Liver) El hgado graso, tambin llamado esteatosis heptica o esteatohepatitis, es una enfermedad en la que se acumula demasiada grasa en las clulas del hgado. El hgado elimina las sustancias dainas del torrente sanguneo y produce lquidos que el cuerpo necesita. Tambin ayuda al cuerpo a Risk manager y Financial controller la energa obtenida de los alimentos que come. En muchos casos, el hgado graso no provoca sntomas ni problemas. Con frecuencia, se diagnostica cuando se realizan estudios por otros motivos. Sin embargo, con Physiological scientist, el hgado graso puede provocar una inflamacin que puede causar problemas hepticos ms graves, como fibrosis heptica (cirrosis). CAUSAS Las causas del hgado graso pueden incluir las siguientes:  Beber alcohol en exceso.  Dficit nutricional.  Obesidad.  Sndrome de [place].  Diabetes.  Hiperlipidemia.  Embarazo.  Determinados medicamentos.  Txicos.  Algunas infecciones virales. FACTORES DE RIESGO Puede tener ms probabilidades de tener hgado graso si:  Consume alcohol en exceso.  Est embarazada.  Tiene sobrepeso.  Tiene diabetes.  Tiene  hepatitis.  Tiene un nivel alto de triglicridos. [place] hgado graso con frecuencia no provoca sntomas. En los casos en que se presentan sntomas, estos pueden incluir:  Fatiga.  Debilidad.  Prdida de peso.  Confusin.  Dolor abdominal.  Color amarillo en la piel y en la zona blanca de los ojos (ictericia).  Nuseas y vmitos. DIAGNSTICO El hgado graso se puede diagnosticar mediante lo siguiente:  Un examen fsico y Ardelia Mems historia clnica.  Anlisis de [place].  Estudios de diagnstico por imgenes, como ecografa, tomografa computarizada (TC) o Health visitor (RM).  Biopsia de hgado. Se extrae una pequea muestra de tejido del hgado usando [place]. La muestra se examina en el microscopio. TRATAMIENTO El hgado graso con frecuencia es causado por otras enfermedades. El tratamiento para el hgado graso puede incluir medicamentos y cambios en el estilo de vida para controlar enfermedades como:  Alcoholismo.  Colesterol elevado.  Diabetes.  Exceso de [place] u obesidad. INSTRUCCIONES PARA EL CUIDADO EN EL HOGAR  Siga una dieta saludable como se lo haya indicado el mdico.  Haga ejercicios regularmente. Esto puede ayudarlo a [employer], y a Academic librarian y la diabetes. Hable con el mdico sobre qu actividades son mejores para usted y Audiological scientist de [place].  No beba alcohol.  Tome los medicamentos solamente como se lo haya indicado el mdico. SOLICITE ATENCIN MDICA SI: Tiene dificultad para controlar lo siguiente:  Nivel de Dispensing optician.  Colesterol.  Consumo de alcohol. SOLICITE ATENCIN MDICA DE INMEDIATO SI:  Siente dolor abdominal.  Tiene ictericia.  Tiene nuseas o vmitos. Esta informacin no tiene Marine scientist el consejo del mdico. Asegrese de hacerle al mdico cualquier pregunta que tenga. Document Released: 12/17/2012 Document Revised: 03/19/2014 Document Reviewed:  07/08/2013 Elsevier Interactive Patient Education  2017 [place] alimentacin cardiosaludable (Heart-Healthy Eating Plan) La planificacin de las comidas cardiosaludables incluye lo siguiente:  [place] grasas poco saludables.  Aumentar las grasas saludables.  Hacer otros pequeos cambios en la dieta. Es posible que tenga que hablar con el mdico o con un especialista en alimentacin (nutricionista) para crear un plan de alimentacin que sea adecuado para usted. QU TIPOS DE GRASAS DEBO ELEGIR?  [place] grasas saludables. Estas incluyen aceite de oliva y de canola, semillas de lino, nueces, almendras y semillas.  Consuma ms grasas omega-3. Estas incluyen salmn, caballa, sardinas, atn, aceite de lino y semillas de lino molidas. Trate de comer pescado al ToysRus veces por semana.  Limite el consumo de grasas saturadas,  las cuales suelen [place] productos de origen animal, como carnes, [place] y crema.  Las grasas saturadas de origen vegetal incluyen aceite de palma, de palmiste y de coco.  Evite los alimentos con aceites parcialmente hidrogenados. Estos incluyen margarina en barra, algunas margarinas untables, galletas, galletitas y otros productos horneados. Estos contienen grasas trans. [place]?  Lea atentamente las etiquetas de los alimentos. Identifique los que contienen grasas trans o altas cantidades de grasas saturadas.  Llene la mitad del plato con verduras y ensaladas de hojas verdes. Coma 4 o 5porciones de verduras Market researcher. Una porcin de verduras equivale a lo siguiente:  1 taza de verduras de hoja crudas.   taza de verduras en trozos crudas o cocidas.   taza de jugo de verduras.  Llene un cuarto del plato con cereales integrales. Busque la palabra "integral" en el  primer lugar de la lista de ingredientes.  Llene un cuarto del plato con alimentos con protenas magras.  Coma 4 o 5porciones  de frutas por da. Una porcin de fruta equivale a lo siguiente:  Una fruta mediana entera.  taza de fruta disecada.   taza de fruta fresca, congelada o enlatada.  taza de jugo 100% de fruta.  Consuma ms alimentos con fibra soluble, los cuales incluyen [place], brcoli, zanahorias, frijoles, guisantes y [place]. Trate de consumir de 20a 30g de Family Dollar Stores.  Coma ms comidas caseras. Coma menos comida de restaurantes, bufs y comida rpida.  Limite o evite el alcohol.  Limite los alimentos con alto contenido de almidn y Location manager.  Evite las comidas fritas.  Evite frer los alimentos. En cambio, trate de cocinarlos en el horno, en la plancha o en la parrilla, o hervirlos. Tambin puede reducir las grasas de la siguiente forma:  Quite la piel de las aves.  Quite todas las grasas visibles de las carnes.  Espume la grasa de los guisos, las sopas y las salsas antes de servirlos.  Cocine al vapor las verduras en agua o caldo.  Baje de peso si es necesario.  Coma 4 o 5porciones de frutos secos, legumbres y semillas por semana:  Una porcin de frijoles o legumbres secos equivale a taza despus de su coccin.  Una porcin de frutos secos equivale a 1onzas.  Una porcin de semillas equivale a onza o una cucharada.  Tal vez deba llevar un registro de la cantidad de sal o sodio que ingiere, especialmente si tiene la presin hipertensin arterial. Hable con el mdico o el nutricionista para obtener ms informacin. QU ALIMENTOS PUEDO COMER? Cereales  Panes, incluido el pan francs, blanco, pita, de [place], de pasas de [place], de centeno, de avena e [place]. Tortillas que no estn fritas ni elaboradas con manteca de cerdo ni grasas trans. Panecillos bajos en grasas, incluidos los panes para perros calientes y [place], y los bollitos tipo ingls. Galletas. Muffins. Waffles. Panqueques. Palomitas de maz con bajo contenido calrico. Cereales integrales. Pan sin  levadura. Tostada Melba. Pretzels. Palitos de pan. Galletas. Colaciones bajas en grasas. Galletitas Parker Hannifin, Lockheed Martin, las que tienen forma de [place], las [place], el pan cimo, las [place], las que tienen forma de [place] y las de centeno. Arroz y pastas, incluido el arroz integral y las pastas elaboradas con cereales integrales. [place] verduras. Frutas  Todas las frutas, pero limite el Nellie. [place] y [place] fuentes de protenas  Carne de res, [place], cerdo y cordero magras sin grasa. Pollo y pavo sin piel. Todos los pescados y [employer] H[employer]away. Pato salvaje, conejo, faisn y venado. Claras de huevo o sustitutos del huevo bajos en colesterol. Porotos, guisantes, lentejas secos y tofu. Semillas y la mayora de los frutos secos. Lcteos  Quesos descremados y semidescremados, entre ellos, ricota, queso en hebras y Artist. Leche descremada o al 1% que sea lquida, en polvo o evaporada. [place]. Yogur descremado o bajo en grasas. Bebidas  [place] mineral. Bebidas gaseosas dietticas. Dulces y postres  Sorbetes y helados de fruta. [place], [place], [place], jalea y [place]. Merengues y gelatinas. Caramelos de [employer], como caramelos duros, caramelos de goma, pastillas de goma, mentas, malvaviscos y pequeas cantidades de chocolate amargo. Torta ngel. Coma todos los dulces y postres con moderacin. Grasas y Advertising copywriter no hidrogenadas (sin grasas trans). Aceites vegetales, incluido el de soja, ssamo, [place], [place], man,  crtamo, maz, canola y semillas de algodn. Alios para ensalada o mayonesa elaborados con aceite vegetal. [place] grasas y los aceites agregados que [place] para Audiological scientist, Teacher, music, preparar ensaladas y las cremas untables. Otros  Cacao en polvo. T o caf. Todos los alios y condimentos. Los artculos mencionados arriba pueden no ser Dean Foods Company de las bebidas o los alimentos recomendados.  Comunquese con el nutricionista para conocer ms opciones.  QU ALIMENTOS NO SE RECOMIENDAN? Cereales  Panes elaborados con grasas saturadas o trans, aceites o Mattel. Croissants. Panecillos de mantequilla. Panes de queso. Panecillos dulces. Rosquillas. Palomitas de maz con mantequilla. Fideos chow mein. Galletitas con Starwood Hotels de grasas, como las que contienen queso o [place]. Carnes y otras fuentes de protenas  Carnes grasas, como perros calientes, [place] de res, salchichas, puntas de [place], [place], asado de [place] o [place], y carnero. Fiambres altos en grasas, como salame y [place]. Caviar. Pato y ganso domsticos. Vsceras, como riones, hgado, [place], y Training and development officer. Lcteos  Crema, crema agria, queso crema y [place] con crema. Quesos elaborados con Mattel, incluido el queso [place] (bleu), [place], [place], [place], [place], [place], suizo, cheddar, camembert y [place]. Leche entera o al 2% que sea [place], evaporada o condensada. Suero de U.S. Bancorp. Salsa de crema o queso alta en grasas. Yogur elaborado con Mattel. Bebidas  Refrescos regulares y jugos con agregado de Location manager. Dulces y Foot Locker. Pudin. Galletas. Tortas que no sean la torta ngel. Caramelos que contengan chocolate con leche o chocolate blanco, grasa hidrogenada, mantequilla, coco o ingredientes desconocidos. Almbares con mantequilla. Helados o bebidas elaboradas con helado con alto contenido de grasas. Grasas y aceites  Salsas que [place], [place] de carne o materia grasa. Manteca de cacao, aceites hidrogenados, aceite de palma, aceite de coco, aceite de palmiste. A menudo, estos se encuentran en los productos horneados, los caramelos, las comidas fritas, las cremas no lcteas y las coberturas batidas. Grasas y materias grasas slidas, incluida la grasa del tocino, el cerdo [place], la [place] de cerdo y Community education officer. Sustitutos de crema no lctea, como cremas  para caf y sustitutos de crema agria. Alios para ensaladas elaborados con aceites desconocidos, queso o crema agria. Los artculos mencionados arriba pueden no ser Dean Foods Company de las bebidas y los alimentos que se Higher education careers adviser. Comunquese con el nutricionista para obtener ms informacin.  Esta informacin no tiene Marine scientist el consejo del mdico. Asegrese de hacerle al mdico cualquier pregunta que tenga. Document Released: 08/28/2011 Document Revised: 03/19/2014 Document Reviewed: 08/20/2013 Elsevier Interactive Patient Education  2017 [employer].
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
## 2016-04-07 NOTE — Progress Notes (Signed)
Amanda Santiago, is a 51 y.o. female  [id number]  [id number]  DOB - 1965/07/08  Chief Complaint  Patient presents with  . Follow-up        Subjective:   Amanda Santiago is a 51 y.o. female here today for a follow up visit w/ hx of dm. Recent ER visit for ruq abd pain, with ruq [place] + for fatty liver, nml gb. She states she is doing well, taking all meds, no c/o.  Very rare etoh.  Is morbid obesity, and working on weight loss, but it is difficulty.   Patient has No headache, No chest pain, No abdominal pain - No Nausea, No new weakness tingling or numbness, No Cough - SOB.  No problems updated.  ALLERGIES: Allergies  Allergen Reactions  . Aspirin Swelling  . Morphine And Related Swelling  . Shellfish-Derived Products Swelling  . Alka-Seltzer [Aspirin Effervescent] Other (See Comments)    unknown    PAST MEDICAL HISTORY: Past Medical History:  Diagnosis Date  . Arthritis   . Diabetes mellitus without complication ([facility])    takes glucaphage  . Kidney stone 9-10 years ago    MEDICATIONS AT HOME: Prior to Admission medications   Medication Sig Start Date End Date Taking? Authorizing Provider  carbamide peroxide (DEBROX) 6.5 % otic solution Place 5 drops into the right ear 2 (two) times daily. Patient not taking: Reported on 03/24/2016 12/06/15   Maren Reamer, MD  clotrimazole (LOTRIMIN) 1 % cream Apply 1 application topically 2 (two) times daily. Patient not taking: Reported on 03/24/2016 10/22/13   Lance Bosch, NP  diclofenac sodium (VOLTAREN) 1 % GEL Apply 2 g topically 4 (four) times daily. 04/04/16   Maren Reamer, MD  gabapentin (NEURONTIN) 300 MG capsule Take 1 capsule (300 mg total) by mouth 3 (three) times daily. 04/04/16   Maren Reamer, MD  metFORMIN (GLUCOPHAGE-XR) 500 MG 24 hr tablet Take 1 tablet (500 mg total) by mouth daily with breakfast. 04/04/16   Maren Reamer, MD  nitroGLYCERIN (NITRODUR - DOSED IN MG/24 HR) 0.2 mg/hr patch  Cut patch into one - fourth pieces Place a one fourth piece of patch on  skin over affected area, changing to a new piece every 24 hours. Patient not taking: Reported on 03/24/2016 10/21/15   Dickie La, MD  nystatin (MYCOSTATIN/NYSTOP) 100000 UNIT/GM POWD Use twice daily in skin folds Patient not taking: Reported on 03/24/2016 10/22/13   Lance Bosch, NP  pantoprazole (PROTONIX) 40 MG tablet Take 1 tablet (40 mg total) by mouth 2 (two) times daily before a meal. 04/04/16   Maren Reamer, MD  pravastatin (PRAVACHOL) 20 MG tablet Take 1 tablet (20 mg total) by mouth at bedtime. 04/04/16   Maren Reamer, MD  traMADol (ULTRAM) 50 MG tablet Take 1 tablet (50 mg total) by mouth every 6 (six) hours as needed. 03/25/16   Okey Regal, PA-C     Objective:   Vitals:   04/04/16 1018  BP: 124/77  Pulse: (!) 58  Resp: 16  Temp: 97.8 F (36.6 C)  TempSrc: Oral  SpO2: 97%  Weight: 122.2 kg (269 lb 6.4 oz)    Exam General appearance : Awake, alert, not in any distress. Speech Clear. Not toxic looking, morbid obese. HEENT: Atraumatic and Normocephalic, pupils equally reactive to light. Neck: supple, no JVD. Chest:Good air entry bilaterally, no added sounds. CVS: S1 S2 regular, no murmurs/gallups or rubs. Abdomen: Bowel sounds active, obese, limited exam  due to body habitus. Non tender and not distended with no gaurding, rigidity or rebound. Extremities: B/L Lower Ext shows no edema, both legs are warm to touch Neurology: Awake alert, and oriented X 3, CN II-XII grossly intact, Non focal Skin:No Rash  Data Review Lab Results  Component Value Date   HGBA1C 5.8 12/06/2015   HGBA1C 6.5 08/29/2015   HGBA1C 8.5 (H) 06/24/2015    Depression screen PHQ 2/9 04/04/2016 12/30/2015 12/06/2015 08/29/2015 06/06/2015  Decreased Interest 1 0 0 0 0  Down, Depressed, Hopeless 1 0 0 0 2  PHQ - 2 Score 2 0 0 0 2  Altered sleeping 2 - 1 - 3  Tired, decreased energy 1 - 1 - 2  Change in appetite 1 - 0 - 0   Feeling bad or failure about yourself  1 - 0 - 2  Trouble concentrating 0 - 0 - 0  Moving slowly or fidgety/restless 0 - 0 - 0  Suicidal thoughts 0 - 0 - 1  PHQ-9 Score 7 - 2 - 10    Ct rena 03/25/16 IMPRESSION: Diffuse fatty infiltration of the liver. Focal circumscribed low-attenuation in the dome of the liver probably represents geographic fatty infiltration but cannot exclude solid mass on noncontrast imaging. Suggest contrast-enhanced CT for further evaluation. Nonobstructing 3 mm stone in the upper pole left kidney. No evidence of bowel obstruction or inflammation.  ruq [place] 03/24/16 IMPRESSION: No evidence of cholelithiasis or cholecystitis. Fatty infiltration of the liver.   Electronically Signed   By: Lucienne Capers M.D.   On: 03/24/2016 22:35   Assessment & Plan   1. Diabetes mellitus without complication (HCC) aic 5.8 (on 12/06/15) - POCT glucose (manual entry) 97 - continue metformin 500 qd  2. Fatty liver May be 2nd to morbid obesity, very rare etoh. - CT Abdomen Pelvis W Contrast; Future  3. RUQ pain [place] neg for cholithiasis/gb obstruction recently, f/u w/ Ct abd  4. Gastroesophageal reflux disease, esophagitis presence not specified ppi  5. Liver mass - ct renal study 03/25/16 noted pocal low- attenuation in dome of liver, probably represents fatty liver, but could not r/o mass. Recd further study w/ enhanced ct. - CT Abdomen Pelvis W Contrast; Future     Patient have been counseled extensively about nutrition and exercise  Return in about 2 weeks (around 04/18/2016) for gerd / pains.  The patient was given clear instructions to go to ER or return to medical center if symptoms don't improve, worsen or new problems develop. The patient verbalized understanding. The patient was told to call to get lab results if they haven't heard anything in the next week.   This note has been created with Automotive engineer. Any transcriptional errors are unintentional.   Maren Reamer, MD, [facility] [place], [place]   04/07/2016, 1:12 PM
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
## 2016-04-11 ENCOUNTER — Encounter (HOSPITAL_COMMUNITY): Payer: Self-pay

## 2016-04-11 ENCOUNTER — Ambulatory Visit (HOSPITAL_COMMUNITY)
Admission: RE | Admit: 2016-04-11 | Discharge: 2016-04-11 | Disposition: A | Discharge status: Home / Self Care | Payer: Self-pay | Source: Ambulatory Visit | Attending: Internal Medicine | Admitting: Internal Medicine

## 2016-04-11 DIAGNOSIS — K760 Fatty (change of) liver, not elsewhere classified: Secondary | ICD-10-CM | POA: Insufficient documentation

## 2016-04-11 DIAGNOSIS — R160 Hepatomegaly, not elsewhere classified: Secondary | ICD-10-CM | POA: Insufficient documentation

## 2016-04-11 DIAGNOSIS — R1011 Right upper quadrant pain: Secondary | ICD-10-CM | POA: Insufficient documentation

## 2016-04-11 MED ORDER — IOPAMIDOL (ISOVUE-300) INJECTION 61%
INTRAVENOUS | Status: AC
  Administered 2016-04-11: 100 mL
  Filled 2016-04-11: qty 100

## 2016-04-17 ENCOUNTER — Encounter: Payer: Self-pay | Admitting: Internal Medicine

## 2016-04-17 ENCOUNTER — Ambulatory Visit: Payer: Self-pay | Attending: Internal Medicine | Admitting: Internal Medicine

## 2016-04-17 VITALS — BP 132/80 | HR 73 | Temp 98.0°F | Resp 16 | Wt 275.5 lb

## 2016-04-17 DIAGNOSIS — Z7984 Long term (current) use of oral hypoglycemic drugs: Secondary | ICD-10-CM | POA: Insufficient documentation

## 2016-04-17 DIAGNOSIS — Z79899 Other long term (current) drug therapy: Secondary | ICD-10-CM | POA: Insufficient documentation

## 2016-04-17 DIAGNOSIS — K760 Fatty (change of) liver, not elsewhere classified: Secondary | ICD-10-CM | POA: Insufficient documentation

## 2016-04-17 DIAGNOSIS — R1011 Right upper quadrant pain: Secondary | ICD-10-CM | POA: Insufficient documentation

## 2016-04-17 DIAGNOSIS — Z888 Allergy status to other drugs, medicaments and biological substances status: Secondary | ICD-10-CM | POA: Insufficient documentation

## 2016-04-17 DIAGNOSIS — Z87442 Personal history of urinary calculi: Secondary | ICD-10-CM | POA: Insufficient documentation

## 2016-04-17 DIAGNOSIS — E119 Type 2 diabetes mellitus without complications: Secondary | ICD-10-CM | POA: Insufficient documentation

## 2016-04-17 LAB — GLUCOSE, POCT (MANUAL RESULT ENTRY): POC GLUCOSE: 118 mg/dL — AB (ref 70–99)

## 2016-04-17 NOTE — Progress Notes (Signed)
Amanda Santiago, is a 51 y.o. female  [id number]  [id number]  DOB - 08-21-1965  Chief Complaint  Patient presents with  . Follow-up        Subjective:   Amanda Santiago is a 51 y.o. female here today for a follow up visit for ruq pain. Still persistent, constant in nature. Does not not associated w/ foods, but she is trying to avoid fatty foods.  Had some discharge in one of her eyes the last few days, no f/c.  Patient has No headache, No chest pain, No abdominal pain - No Nausea, No new weakness tingling or numbness, No Cough - SOB.  No problems updated.  ALLERGIES: Allergies  Allergen Reactions  . Aspirin Swelling  . Morphine And Related Swelling  . Shellfish-Derived Products Swelling  . Alka-Seltzer [Aspirin Effervescent] Other (See Comments)    unknown    PAST MEDICAL HISTORY: Past Medical History:  Diagnosis Date  . Arthritis   . Diabetes mellitus without complication ([place])    takes glucaphage  . Kidney stone 9-10 years ago    MEDICATIONS AT HOME: Prior to Admission medications   Medication Sig Start Date End Date Taking? Authorizing Provider  carbamide peroxide (DEBROX) 6.5 % otic solution Place 5 drops into the right ear 2 (two) times daily. Patient not taking: Reported on 03/24/2016 12/06/15   Maren Reamer, MD  clotrimazole (LOTRIMIN) 1 % cream Apply 1 application topically 2 (two) times daily. Patient not taking: Reported on 03/24/2016 10/22/13   Lance Bosch, NP  diclofenac sodium (VOLTAREN) 1 % GEL Apply 2 g topically 4 (four) times daily. 04/04/16   Maren Reamer, MD  gabapentin (NEURONTIN) 300 MG capsule Take 1 capsule (300 mg total) by mouth 3 (three) times daily. 04/04/16   Maren Reamer, MD  metFORMIN (GLUCOPHAGE-XR) 500 MG 24 hr tablet Take 1 tablet (500 mg total) by mouth daily with breakfast. 04/04/16   Maren Reamer, MD  nitroGLYCERIN (NITRODUR - DOSED IN MG/24 HR) 0.2 mg/hr patch Cut patch into one - fourth pieces  Place a one fourth piece of patch on  skin over affected area, changing to a new piece every 24 hours. Patient not taking: Reported on 03/24/2016 10/21/15   Dickie La, MD  nystatin (MYCOSTATIN/NYSTOP) 100000 UNIT/GM POWD Use twice daily in skin folds Patient not taking: Reported on 03/24/2016 10/22/13   Lance Bosch, NP  pantoprazole (PROTONIX) 40 MG tablet Take 1 tablet (40 mg total) by mouth 2 (two) times daily before a meal. 04/04/16   Maren Reamer, MD  pravastatin (PRAVACHOL) 20 MG tablet Take 1 tablet (20 mg total) by mouth at bedtime. 04/04/16   Maren Reamer, MD  traMADol (ULTRAM) 50 MG tablet Take 1 tablet (50 mg total) by mouth every 6 (six) hours as needed. 03/25/16   Okey Regal, PA-C     Objective:   Vitals:   04/17/16 1509  BP: 132/80  Pulse: 73  Resp: 16  Temp: 98 F (36.7 C)  TempSrc: Oral  SpO2: 99%  Weight: 275 lb 8 oz (125 kg)    Exam General appearance : Awake, alert, not in any distress. Speech Clear. Not toxic looking, morbid obese. HEENT: Atraumatic and Normocephalic, pupils equally reactive to light.  No conjunctivitis/discharge noted. Neck: supple, no JVD.  Chest:Good air entry bilaterally, no added sounds. CVS: S1 S2 regular, no murmurs/gallups or rubs. Abdomen: Bowel sounds active, Remains ttp ruq on mild palpation, no g/r. difficult exam due  to body habitus. Extremities: B/L Lower Ext shows no edema, both legs are warm to touch Neurology: Awake alert, and oriented X 3, CN II-XII grossly intact, Non focal Skin:No Rash  Data Review Lab Results  Component Value Date   HGBA1C 5.8 12/06/2015   HGBA1C 6.5 08/29/2015   HGBA1C 8.5 (H) 06/24/2015    Depression screen PHQ 2/9 04/17/2016 04/04/2016 12/30/2015 12/06/2015 08/29/2015  Decreased Interest 1 1 0 0 0  Down, Depressed, Hopeless 1 1 0 0 0  PHQ - 2 Score 2 2 0 0 0  Altered sleeping 1 2 - 1 -  Tired, decreased energy 1 1 - 1 -  Change in appetite 2 1 - 0 -  Feeling bad or failure about  yourself  0 1 - 0 -  Trouble concentrating 0 0 - 0 -  Moving slowly or fidgety/restless 0 0 - 0 -  Suicidal thoughts 0 0 - 0 -  PHQ-9 Score 6 7 - 2 -   Ct abd / 04/11/16 IMPRESSION: Chronic fatty change of the liver. This is somewhat heterogeneous as can be seen. No suspicion of a liver mass based on this study. The findings could be associated with right-sided abdominal pain.   Electronically Signed   By: Nelson Chimes M.D.  03/24/16 abd US FINDINGS: Gallbladder:  No gallstones or wall thickening visualized. No sonographic Murphy sign noted by sonographer.  Common bile duct:  Diameter: 3.9 mm, normal  Liver:  Heterogeneous increased over parenchymal echotexture consistent with geographic fatty infiltration of the liver. No focal lesions identified.  IMPRESSION: No evidence of cholelithiasis or cholecystitis. Fatty infiltration of the liver.   Electronically Signed   By: Lucienne Capers M.D.   On: 03/24/2016 22:35  Assessment & Plan   1. RUQ abdominal pain 2nd to fatty liver vs gb disease? gb appeared nml on ct and U/S, morbid obese, possibly biliary colic. - Ambulatory referral to Gastroenterology - Ambulatory referral to General Surgery - [place]; Future  2/14 - low fat diet advised at this time.  2. Fatty liver  - as cause of pain? - Ambulatory referral to Gastroenterology    3. Diabetes mellitus without complication ([place]) - POCT glucose (manual entry)  4. Morbid obesity ([place]) See #1, advised increase exercise/weight loss  5. Eyes examined - no obvious signs of conjunctivitis Recd warm compresses to eyes tid for discharge.    Patient have been counseled extensively about nutrition and exercise  Return in about 2 months (around 06/15/2016), or if symptoms worsen or fail to improve.  The patient was given clear instructions to go to ER or return to medical center if symptoms don't improve, worsen or new problems develop. The  patient verbalized understanding. The patient was told to call to get lab results if they haven't heard anything in the next week.   This note has been created with Surveyor, quantity. Any transcriptional errors are unintentional.   Maren Reamer, MD, [facility] [place], [place]   04/17/2016, 3:56 PM

## 2016-04-17 NOTE — Patient Instructions (Signed)
Dieta con bajo contenido de grasas para la pancreatitis o los trastornos de la vescula  (Low-Fat Diet for Pancreatitis or Gallbladder Conditions)  Una dieta con bajo contenido de grasas puede ser til si usted tiene pancreatitis o trastornos de la vescula. En estos trastornos, el pncreas y la vescula tienen dificultades para digerir las grasas. Un plan de alimentacin saludable con menos grasas ayudar a que el pncreas y la vescula descansen, y reducir los sntomas.  QU DEBO SABER ACERCA DE ESTA DIETA?   Consuma una dieta con bajo contenido de grasas.  ? Reduzca el consumo de grasas a menos del 20% al 30% del total de caloras diarias. Esto representa menos de 50a 60gramos de grasas por da.  ? Recuerde que la dieta debe incluir algo de grasa. Consulte al nutricionista cul debe ser su meta diaria.  ? Elija alimentos saludables sin grasas o con bajo contenido de grasas. Busque las palabras "sin grasa", "bajo en grasas" o "bajo contenido de grasas".  ? Como gua, mire la etiqueta y elija alimentos con menos de 3gramos de grasa por porcin. Coma solo una porcin.   Evite el alcohol.   No fumar. Si necesita ayuda para dejar de fumar, hable con el mdico.   Haga comidas pequeas y frecuentes en lugar de 3 comidas abundantes y pesadas.    QU ALIMENTOS PUEDO COMER?  Cereales  Incluya granos y almidones saludables, como papas, pan integral, cereales ricos en fibras y arroz integral. Elija opciones de cereales integrales siempre que sea posible. En los adultos, los cereales integrales deben representar del 45% al 65% de las caloras diarias.  Frutas y verduras  Coma muchas frutas y verduras. Las frutas y verduras frescas aportan fibra a la dieta.  Carnes y otras fuentes de protenas  Coma carnes magras, como pollo y cerdo. Quite la grasa de la carne antes de cocinarla. Los huevos, el pescado y los frijoles son otras fuentes de protenas. En los adultos, estos alimentos deben representar entre el 10% y  el 35% de las caloras diarias.  Lcteos  Elija leche y otros productos lcteos con bajo contenido de grasas. Los lcteos aportan grasas y protenas, adems de calcio.  Grasas y aceites  Limite los alimentos con alto contenido de grasas, como las comidas fritas, los dulces, los productos horneados y las bebidas azucaradas.  Otros  Los condimentos y las salsas cremosas, como la mayonesa, pueden aportar grasa adicional. Piense si necesita usarlos o no, o use pequeas cantidades u opciones con bajo contenido de grasas.  QU ALIMENTOS NO ESTN RECOMENDADOS?   Los alimentos con alto contenido de grasas, por ejemplo:  ? Alimentos horneados.  ? Helados.  ? Tostadas francesas.  ? Panecillos dulces.  ? Pizza.  ? Pan de queso.  ? Alimentos rebozados o cubiertos con mantequilla, salsas cremosas o queso.  ? Comidas fritas.  ? Postres y bebidas azucaradas.   Alimentos que causan gases o meteorismo    Esta informacin no tiene como fin reemplazar el consejo del mdico. Asegrese de hacerle al mdico cualquier pregunta que tenga.  Document Released: 03/03/2013 Document Revised: 03/03/2013 Document Reviewed: 02/09/2013  Elsevier Interactive Patient Education  2017 Elsevier Inc[employer]

## 2016-04-25 ENCOUNTER — Ambulatory Visit (HOSPITAL_COMMUNITY)
Admission: RE | Admit: 2016-04-25 | Discharge: 2016-04-25 | Disposition: A | Discharge status: Home / Self Care | Payer: Self-pay | Source: Ambulatory Visit | Attending: Internal Medicine | Admitting: Internal Medicine

## 2016-04-25 DIAGNOSIS — R1011 Right upper quadrant pain: Secondary | ICD-10-CM | POA: Insufficient documentation

## 2016-04-30 ENCOUNTER — Ambulatory Visit (INDEPENDENT_AMBULATORY_CARE_PROVIDER_SITE_OTHER): Payer: Self-pay | Admitting: Physician Assistant

## 2016-04-30 ENCOUNTER — Encounter: Payer: Self-pay | Admitting: Physician Assistant

## 2016-04-30 VITALS — BP 130/80 | HR 66 | Ht 66.0 in | Wt 271.0 lb

## 2016-04-30 DIAGNOSIS — R1011 Right upper quadrant pain: Secondary | ICD-10-CM

## 2016-04-30 DIAGNOSIS — R110 Nausea: Secondary | ICD-10-CM

## 2016-04-30 NOTE — Patient Instructions (Signed)

## 2016-04-30 NOTE — Progress Notes (Signed)
Chief Complaint: Upper quadrant abdominal pain, nausea  HPI:  Amanda Santiago is a 51 year old Hispanic female with a past medical history of arthritis and diabetes, who is primarily Spanish-speaking,who was referred to me by Maren Reamer, MD for a complaint of right upper quadrant abdominal pain and nausea . Interpreter present today. Patient has previously followed with Dr. Silverio Decamp for a colonoscopy 11/18/15.   The patient has recently had a large workup for this pain starting with a right upper quadrant ultrasound on 03/24/16 which showed no evidence of cholelithiasis or cholecystitis. Fatty infiltration of the liver. Patient then had a CT renal stone study on 03/25/16 which showed diffuse fatty infiltration of the liver. Focal circumscribed low attenuation in the dome of the liver probably represents geographic fatty infiltration but cannot exclude solid mass on on cramp contrast imaging. A contrast CT was recommended. Nonobstructing 3 mm stone in the upper pole of the left kidney. No evidence of bile duct stricture inflammation. CT abdomen and pelvis with contrast was completed 04/11/16 and showed chronic fatty changes of the liver. This was somewhat heterogenous. No suspicion of a liver mass based on this study. The findings could be associated with right sided abdominal pain. Patient then had a HIDA scan with CCK onto/14/18 which was normal. Patient has also had recent labs including a CBC, CMP and lipase which were all normal on 03/24/16.    Today the patient tells me that this right upper quadrant pain has been chronic over the past 1-2 years, though over the past couple of months this has started to occur throughout the day. Before this time it was only at night. Patient tells me that she wakes up and feels okay, but as the day wears on she develops a right upper quadrant 4/10 constant pain. She does not believe that eating makes this significantly worse or any better. She was started on  Pantoprazole 40 mg twice a day "a while ago", which has also not seemed to change her symptoms. See his symptoms include a chronic feeling of nausea. The patient tells me she has been referred to a surgeon for further evaluation.   Patient denies fever, chills, blood in her stool, melena, change in bowel habits, weight loss, fatigue, anorexia, vomiting, heartburn, reflux or symptoms that awaken her at night.  Past Medical History:  Diagnosis Date  . Arthritis   . Diabetes mellitus without complication ([place])    takes glucaphage  . Kidney stone 9-10 years ago    Past Surgical History:  Procedure Laterality Date  . CESAREAN SECTION    . LITHOTRIPSY      Current Outpatient Prescriptions  Medication Sig Dispense Refill  . diclofenac sodium (VOLTAREN) 1 % GEL Apply 2 g topically 4 (four) times daily. 100 g 2  . gabapentin (NEURONTIN) 300 MG capsule Take 1 capsule (300 mg total) by mouth 3 (three) times daily. 90 capsule 3  . metFORMIN (GLUCOPHAGE-XR) 500 MG 24 hr tablet Take 1 tablet (500 mg total) by mouth daily with breakfast. 90 tablet 3  . nitroGLYCERIN (NITRODUR - DOSED IN MG/24 HR) 0.2 mg/hr patch Cut patch into one - fourth pieces Place a one fourth piece of patch on  skin over affected area, changing to a new piece every 24 hours. 30 patch 1  . pravastatin (PRAVACHOL) 20 MG tablet Take 1 tablet (20 mg total) by mouth at bedtime. 90 tablet 3  . pantoprazole (PROTONIX) 40 MG tablet Take 1 tablet (40 mg total) by  mouth 2 (two) times daily before a meal. 60 tablet 1   No current facility-administered medications for this visit.     Allergies as of 04/30/2016 - Review Complete 04/30/2016  Allergen Reaction Noted  . Aspirin Swelling 09/14/2013  . Morphine and related Swelling 01/15/2014  . Shellfish-derived products Swelling 09/14/2013  . Alka-seltzer [aspirin effervescent] Other (See Comments) 03/24/2016    Family History  Problem Relation Age of Onset  . Diabetes Mother   .  Hyperlipidemia Mother   . Hypertension Mother   . Colon cancer Maternal Grandmother   . Esophageal cancer Neg Hx   . Rectal cancer Neg Hx   . Stomach cancer Neg Hx     Social History   Social History  . Marital status: Married    Spouse name: N/A  . Number of children: N/A  . Years of education: N/A   Occupational History  . Not on file.   Social History Main Topics  . Smoking status: Former Smoker    Packs/day: 1.50    Years: 18.00    Types: Cigarettes    Quit date: 02/09/1998  . Smokeless tobacco: Former Systems developer    Quit date: 03/12/1997  . Alcohol use No     Comment: rare  . Drug use: No  . Sexual activity: Not on file   Other Topics Concern  . Not on file   Social History Narrative  . No narrative on file    Review of Systems:    Constitutional: No weight loss, fever or chills Skin: No rash Cardiovascular: No chest pain Respiratory: No SOB Gastrointestinal: See HPI and otherwise negative Genitourinary: No dysuria or change in urinary frequency Neurological: No headache, dizziness or syncope Musculoskeletal: No new muscle or joint pain Hematologic: No bleeding or bruising Psychiatric: No history of depression or anxiety   Physical Exam:  Vital signs: BP 130/80   Pulse 66   Ht 5\' 6"  (1.676 m)   Wt 271 lb (122.9 kg)   BMI 43.74 kg/m   Constitutional:   Pleasant Overweight Hispanic female appears to be in NAD, Well developed, Well nourished, alert and cooperative Head:  Normocephalic and atraumatic. Eyes:   PEERL, EOMI. No icterus. Conjunctiva pink. Ears:  Normal auditory acuity. Neck:  Supple Throat: Oral cavity and pharynx without inflammation, swelling or lesion.  Respiratory: Respirations even and unlabored. Lungs clear to auscultation bilaterally.   No wheezes, crackles, or rhonchi.  Cardiovascular: Normal S1, S2. No MRG. Regular rate and rhythm. No peripheral edema, cyanosis or pallor.  Gastrointestinal:  Soft, nondistended, mild right upper quadrant  tenderness No rebound or guarding. Normal bowel sounds. No appreciable masses or hepatomegaly. Rectal:  Not performed.  Msk: Tenderness with palpation of right costal margin Symmetrical without gross deformities. Without edema, no deformity or joint abnormality.  Neurologic:  Alert and  oriented x4;  grossly normal neurologically.  Skin:   Dry and intact without significant lesions or rashes. Psychiatric:  Demonstrates good judgement and reason without abnormal affect or behaviors.  RELEVANT LABS AND IMAGING: CBC    Component Value Date/Time   WBC 9.6 03/24/2016 1440   RBC 4.87 03/24/2016 1440   HGB 14.5 03/24/2016 1440   HCT 43.1 03/24/2016 1440   PLT 277 03/24/2016 1440   MCV 88.5 03/24/2016 1440   MCH 29.8 03/24/2016 1440   MCHC 33.6 03/24/2016 1440   RDW 15.0 03/24/2016 1440   LYMPHSABS 3,456 08/29/2015 1130   MONOABS 672 08/29/2015 1130   EOSABS 96 08/29/2015 1130  BASOSABS 0 08/29/2015 1130    CMP     Component Value Date/Time   NA 139 03/24/2016 1440   K 4.0 03/24/2016 1440   CL 103 03/24/2016 1440   CO2 25 03/24/2016 1440   GLUCOSE 92 03/24/2016 1440   BUN 8 03/24/2016 1440   CREATININE 0.41 (L) 03/24/2016 1440   CREATININE 0.58 08/29/2015 1130   CALCIUM 8.9 03/24/2016 1440   PROT 8.5 (H) 03/24/2016 1440   ALBUMIN 3.8 03/24/2016 1440   AST 23 03/24/2016 1440   ALT 25 03/24/2016 1440   ALKPHOS 84 03/24/2016 1440   BILITOT 0.6 03/24/2016 1440   GFRNONAA >60 03/24/2016 1440   GFRNONAA >89 08/29/2015 1130   GFRAA >60 03/24/2016 1440   GFRAA >89 08/29/2015 1130    Ref Range & Units 60mo ago 10yr ago 37yr ago    Lipase 11 - 51 U/L 26  39R  24R    US ABDOMEN LIMITED - RIGHT UPPER QUADRANT 03/24/16  COMPARISON:  MRI abdomen 04/12/2014.  CT abdomen 11/23/2009  FINDINGS: Gallbladder:  No gallstones or wall thickening visualized. No sonographic Murphy sign noted by sonographer.  Common bile duct:  Diameter: 3.9 mm, normal  Liver:  Heterogeneous  increased over parenchymal echotexture consistent with geographic fatty infiltration of the liver. No focal lesions identified.  IMPRESSION: No evidence of cholelithiasis or cholecystitis. Fatty infiltration of the liver.   Electronically Signed   By: Lucienne Capers M.D.   On: 03/24/2016 22:35  CT ABDOMEN AND PELVIS WITHOUT CONTRAST 03/25/16  TECHNIQUE: Multidetector CT imaging of the abdomen and pelvis was performed following the standard protocol without IV contrast.  COMPARISON:  11/23/2009  FINDINGS: Lower chest: Lung bases are clear.  Hepatobiliary: Mild diffuse fatty infiltration of the liver. Focal circumscribed area of low attenuation in the dome of the liver measures 3.2 cm diameter. This probably represents geographic fatty infiltration given the overall appearance of the liver, but mass or metastatic lesion is not excluded. Suggest contrast-enhanced CT for further evaluation. Gallbladder and bile ducts are unremarkable.  Pancreas: Unremarkable. No pancreatic ductal dilatation or surrounding inflammatory changes.  Spleen: Normal in size without focal abnormality.  Adrenals/Urinary Tract: No adrenal gland nodules. 3 mm stone in the upper pole left kidney. No hydronephrosis or hydroureter. No ureteral stones. Bladder wall is not thickened. No bladder stones.  Stomach/Bowel: Stomach is within normal limits. Appendix appears normal. No evidence of bowel wall thickening, distention, or inflammatory changes.  Vascular/Lymphatic: No significant vascular findings are present. No enlarged abdominal or pelvic lymph nodes.  Reproductive: Uterus and bilateral adnexa are unremarkable. Surgical clips in the pelvis consistent with tubal ligations.  Other: No free air or free fluid in the abdomen. Abdominal wall musculature appears intact.  Musculoskeletal: Degenerative changes in the lumbar spine. No destructive bone lesions.  IMPRESSION: Diffuse  fatty infiltration of the liver. Focal circumscribed low-attenuation in the dome of the liver probably represents geographic fatty infiltration but cannot exclude solid mass on noncontrast imaging. Suggest contrast-enhanced CT for further evaluation. Nonobstructing 3 mm stone in the upper pole left kidney. No evidence of bowel obstruction or inflammation.   Electronically Signed   By: Lucienne Capers M.D.   On: 03/25/2016 00:51  CT ABDOMEN AND PELVIS WITH CONTRAST 04/11/16  TECHNIQUE: Multidetector CT imaging of the abdomen and pelvis was performed using the standard protocol following bolus administration of intravenous contrast.  CONTRAST:  127mL ISOVUE-300 IOPAMIDOL (ISOVUE-300) INJECTION 61%  COMPARISON:  03/25/2016.  11/23/2009.  FINDINGS: Lower chest:  Normal  Hepatobiliary: Fatty change of the liver with some heterogeneity. No suspicion of focal mass lesion. No calcified gallstones.  Pancreas: Normal  Spleen: Normal  Adrenals/Urinary Tract: Adrenal glands are normal. Nonobstructing 3 mm stone upper pole left kidney. Kidneys are otherwise normal.  Stomach/Bowel: No significant bowel finding. No evidence of obstruction, inflammation or mass by CT. Normal appearing appendix.  Vascular/Lymphatic: Normal  Reproductive: Uterus and adnexal regions are unremarkable. Previous tubal clips.  Other: None significant  Musculoskeletal: Chronic spinal degenerative changes with endplate osteophytes and calcified annular bulges.  IMPRESSION: Chronic fatty change of the liver. This is somewhat heterogeneous as can be seen. No suspicion of a liver mass based on this study. The findings could be associated with right-sided abdominal pain.   Electronically Signed   By: Nelson Chimes M.D.   On: 04/11/2016 16:01  NUCLEAR MEDICINE HEPATOBILIARY IMAGING WITH GALLBLADDER EF 04/25/16  TECHNIQUE: Sequential images of the abdomen were obtained out to 60  minutes following intravenous administration of radiopharmaceutical. After oral ingestion of Ensure, gallbladder ejection fraction was determined. At 60 min, normal ejection fraction is greater than 33%.  RADIOPHARMACEUTICALS:  4.9 mCi Tc-35m  Choletec IV  COMPARISON:  None  FINDINGS: Patient motion throughout first hour of exam.  Normal tracer extraction from bloodstream indicating normal hepatocellular function.  Normal excretion of tracer into biliary tree.  Gallbladder visualized at 20 min.  Small bowel visualized at 54 min.  No hepatic retention of tracer.  Subjectively normal emptying of tracer from gallbladder following fatty meal stimulation.  Calculated gallbladder ejection fraction is 43%, normal.  Patient reported no symptoms following Ensure ingestion.  Normal gallbladder ejection fraction following Ensure ingestion is greater than 33% at 1 hour.  IMPRESSION: Normal exam.   Electronically Signed   By: Lavonia Dana M.D.   On: 04/25/2016 12:46   Assessment: 1. RUQ abdominal pain:For the past 1-2 years, now more chronic, 4/0, no worse with food, no better with pantoprazole, ultrasound and HIDA scan as well as CT scan normal other than fatty liver, long history of NSAID use at least 3 times a day; consider duodenitis/ulceration related to NSAID use vs musculoskeletal (patient was tender over right costal margin, but tells me this is not the exact pain she is experiencing) vs other 2. Nausea: Patient describes throughout the day, see above 3. Fatty liver: Seen on recent imaging studies, no sign of mass or other lesion, LFTs normal; typically fatty liver does not cause the patient any discomfort and if it did I believe this would be constant  Plan: 1. Scheduled patient for an EGD for further evaluation. Discussed risks, benefits, limitations and alternatives and the patient agrees to proceed. This was scheduled with Dr. Silverio Decamp in the [facility]. 2.  Discussed with the patient that she can follow with the surgical team in regards to right upper quadrant pain, but likely they will not do anything further as her HIDA scan was normal. 3. Recommend the patient start a slow and steady weight loss of at least 1-2 pounds per week for her fatty liver 4. Patient to return to clinic per Dr. Woodward Ku recommendations after time of procedure.  Ellouise Newer, PA-C [facility] 04/30/2016, 10:46 AM  Cc: Maren Reamer, MD

## 2016-05-01 ENCOUNTER — Telehealth: Payer: Self-pay

## 2016-05-01 NOTE — Telephone Encounter (Signed)
CMA call to go over scan results  Patient did not answer but CMA left a VM stating her results being normal and that her pcp will refer her to the gen surgery for further eval.  & if have any questions just to call me back

## 2016-05-01 NOTE — Telephone Encounter (Signed)
-----   Message from Maren Reamer, MD sent at 05/01/2016 11:34 AM EST ----- Please call. Pt hida scan was normal, does not look like it is her gallbladder causing her ruq abd pain. Will defer to gen surgery and gi for further eval. thanks

## 2016-05-03 ENCOUNTER — Encounter: Payer: Self-pay | Admitting: Surgery

## 2016-05-03 ENCOUNTER — Ambulatory Visit (INDEPENDENT_AMBULATORY_CARE_PROVIDER_SITE_OTHER): Payer: Self-pay | Admitting: Surgery

## 2016-05-03 VITALS — BP 149/81 | HR 71 | Temp 98.1°F | Ht 66.0 in | Wt 273.6 lb

## 2016-05-03 DIAGNOSIS — G8929 Other chronic pain: Secondary | ICD-10-CM

## 2016-05-03 DIAGNOSIS — R1011 Right upper quadrant pain: Secondary | ICD-10-CM

## 2016-05-03 NOTE — Progress Notes (Signed)
Surgical Consultation  05/03/2016  Amanda Santiago is an 51 y.o. female.   Chief Complaint  Patient presents with  . New Patient (Initial Visit)    RUQ Abdominal Pain      HPI: 51 year old female seen for chronic abdominal pain in the right upper quadrant. She reports that she has had this pain for greater than 2 years. Associated nausea. Pain is intermittent no associated alley beading or aggravating factors. She has had extensive workup including MRI, multiple CT scans and multiple ultrasounds of the right upper quadrant. Please note that I have personally reviewed the images and she does have a fatty liver without any definitive malignant lesions. There is no evidence of gallstones and there is no evidence of biliary dilation. Her LFTs are normal. She recently went to GI and did have a colonoscopy and actually has an EGD schedule for next week. HIDA scan was performed showing a normal ejection fraction and no pain when ensure was given She has normal bowel movements and no emesis. No evidence of biliary obstruction, fevers or chills  Past Medical History:  Diagnosis Date  . Arthritis   . Diabetes mellitus without complication ([place])    takes glucaphage  . 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   . Kidney stone 9-10 years ago  . Kidney stone   . Morbid obesity ([place]) 04/06/2014   BMI 50.6   . Pain in joint, ankle and foot 04/06/2014   LEFT MRI 10/1015  . Sternal pain 06/21/2014    Past Surgical History:  Procedure Laterality Date  . CESAREAN SECTION    . LITHOTRIPSY      Family History  Problem Relation Age of Onset  . Diabetes Mother   . Hyperlipidemia Mother   . Hypertension Mother   . Colon cancer Maternal Grandmother   . Esophageal cancer Neg Hx   . Rectal cancer Neg Hx   . Stomach cancer Neg Hx     Social History:  reports that she quit smoking about 18 years ago. Her smoking  use included Cigarettes. She has a 27.00 pack-year smoking history. She quit smokeless tobacco use about 19 years ago. She reports that she does not drink alcohol or use drugs.  Allergies:  Allergies  Allergen Reactions  . Aspirin Swelling  . Morphine And Related Swelling  . Shellfish-Derived Products Swelling  . Alka-Seltzer [Aspirin Effervescent] Other (See Comments)    unknown    Medications reviewed.     ROS     BP (!) 149/81   Pulse 71   Temp 98.1 F (36.7 C) (Oral)   Ht 5\' 6"  (1.676 m)   Wt 124.1 kg (273 lb 9.6 oz)   BMI 44.16 kg/m   Physical Exam  Constitutional: She is oriented to person, place, and time and well-developed, well-nourished, and in no distress. No distress.  Eyes: Left eye exhibits no discharge. No scleral icterus.  Neck: Normal range of motion. No JVD present. No tracheal deviation present. No thyromegaly present.  Pulmonary/Chest: Effort normal. No respiratory distress. She exhibits no tenderness.  Abdominal: Soft. She exhibits no distension. There is no tenderness. There is no rebound and no guarding.  Musculoskeletal: Normal range of motion. She exhibits no edema.  Neurological: She is alert and oriented to person, place, and time. Gait normal. GCS score is 15.  Skin: Skin is warm and dry. She is not diaphoretic.  Psychiatric: Mood, memory, affect and judgment  normal.  Nursing note and vitals reviewed.     No results found for this or any previous visit (from the past 48 hour(s)). No results found.  Assessment/Plan: RUQ pain unclear etiology. There is no evidence of gallstones and a HIDA scan is normal. I will recommend further workup with an EGD and follow-up with GI. This is more of a functional type pain than any structural or anatomical abnormality. I do not think there is any indication for cholecystectomy at this time. Discussed with the patient in detail about my thought processes and out about all her previous imaging his status and  workup. She understands and agrees with the plan   Caroleen Hamman, MD [facility]

## 2016-05-03 NOTE — Patient Instructions (Signed)
Please call our office with any questions or concerns. 

## 2016-05-07 NOTE — Progress Notes (Signed)
Reviewed and agree with documentation and assessment and plan. K. Veena Nandigam , MD   

## 2016-05-14 ENCOUNTER — Ambulatory Visit (AMBULATORY_SURGERY_CENTER): Payer: Self-pay | Admitting: Gastroenterology

## 2016-05-14 ENCOUNTER — Encounter: Payer: Self-pay | Admitting: Gastroenterology

## 2016-05-14 VITALS — BP 123/72 | HR 53 | Temp 97.3°F | Resp 13 | Ht 66.0 in | Wt 273.0 lb

## 2016-05-14 DIAGNOSIS — K2990 Gastroduodenitis, unspecified, without bleeding: Secondary | ICD-10-CM

## 2016-05-14 DIAGNOSIS — R1011 Right upper quadrant pain: Secondary | ICD-10-CM

## 2016-05-14 DIAGNOSIS — R110 Nausea: Secondary | ICD-10-CM

## 2016-05-14 DIAGNOSIS — K2970 Gastritis, unspecified, without bleeding: Secondary | ICD-10-CM

## 2016-05-14 MED ORDER — SODIUM CHLORIDE 0.9 % IV SOLN
500.0000 mL | INTRAVENOUS | Status: DC
Start: 2016-05-14 — End: 2023-12-16

## 2016-05-14 NOTE — Progress Notes (Signed)
Called to room to assist during endoscopic procedure.  Patient ID and intended procedure confirmed with present staff. Received instructions for my participation in the procedure from the performing physician.  

## 2016-05-14 NOTE — Op Note (Signed)
[facility] Patient Name: Amanda Santiago Procedure Date: 05/14/2016 10:18 AM MRN: [id number] Endoscopist: Mauri Pole , MD Age: 51 Referring MD:  Date of Birth: 30-Nov-1965 Gender: Female Account #: [id number] Procedure:                Upper GI endoscopy Indications:              Upper abdominal symptoms that persist despite an                            appropriate trial of therapy, Abdominal pain in the                            right upper quadrant, Dyspepsia, Nausea Medicines:                Monitored Anesthesia Care Procedure:                Pre-Anesthesia Assessment:                           - Prior to the procedure, a History and Physical                            was performed, and patient medications and                            allergies were reviewed. The patient's tolerance of                            previous anesthesia was also reviewed. The risks                            and benefits of the procedure and the sedation                            options and risks were discussed with the patient.                            All questions were answered, and informed consent                            was obtained. Prior Anticoagulants: The patient has                            taken no previous anticoagulant or antiplatelet                            agents. ASA Grade Assessment: III - A patient with                            severe systemic disease. After reviewing the risks                            and benefits, the patient was deemed in  satisfactory condition to undergo the procedure.                           After obtaining informed consent, the endoscope was                            passed under direct vision. Throughout the                            procedure, the patient's blood pressure, pulse, and                            oxygen saturations were monitored continuously. The   Endoscope was introduced through the mouth, and                            advanced to the second part of duodenum. The upper                            GI endoscopy was accomplished without difficulty.                            The patient tolerated the procedure well. Scope In: Scope Out: Findings:                 The esophagus was normal.                           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 The                            proximal extent of the gastric folds (end of                            tubular esophagus) was 35 cm from the incisors. The                            hiatal narrowing was 50 cm from the incisors. The                            Z-line was 33 cm from the incisors.                           Scattered mild inflammation characterized by                            congestion (edema) and erythema was found in the                            entire examined stomach. Biopsies were taken with a                            cold forceps for histology.  The examined duodenum was normal. Complications:            No immediate complications. Estimated Blood Loss:     Estimated blood loss was minimal. Impression:               - Normal esophagus.                           - Medium-sized paraesophageal hernia.                           - Gastritis. Biopsied.                           - Normal examined duodenum. Recommendation:           - Resume previous diet.                           - Continue present medications.                           - Await pathology results.                           - No aspirin, ibuprofen, naproxen, or other                            non-steroidal anti-inflammatory drugs. Mauri Pole, MD 05/14/2016 10:47:31 AM This report has been signed electronically.

## 2016-05-14 NOTE — Patient Instructions (Addendum)
YOU HAD AN ENDOSCOPIC PROCEDURE TODAY AT [facility]:   Refer to the procedure report that was given to you for any specific questions about what was found during the examination.  If the procedure report does not answer your questions, please call your gastroenterologist to clarify.  If you requested that your care partner not be given the details of your procedure findings, then the procedure report has been included in a sealed envelope for you to review at your convenience later.  YOU SHOULD EXPECT: Some feelings of bloating in the abdomen. Passage of more gas than usual.  Walking can help get rid of the air that was put into your GI tract during the procedure and reduce the bloating. If you had a lower endoscopy (such as a colonoscopy or flexible sigmoidoscopy) you may notice spotting of blood in your stool or on the toilet paper. If you underwent a bowel prep for your procedure, you may not have a normal bowel movement for a few days.  Please Note:  You might notice some irritation and congestion in your nose or some drainage.  This is from the oxygen used during your procedure.  There is no need for concern and it should clear up in a day or so.  SYMPTOMS TO REPORT IMMEDIATELY:   Following upper endoscopy (EGD)  Vomiting of blood or coffee ground material  New chest pain or pain under the shoulder blades  Painful or persistently difficult swallowing  New shortness of breath  Fever of 100F or higher  Black, tarry-looking stools  For urgent or emergent issues, a gastroenterologist can be reached at any hour by calling [phone].  Please read all handouts given to you by your recovery nurse.   DIET:  We do recommend a small meal at first, but then you may proceed to your regular diet.  Drink plenty of fluids but you should avoid alcoholic beverages for 24 hours.  No ibuprofen, motrin aleve, NSAIDs for 2 weeks.  ACTIVITY:  You should plan to take it easy for the  rest of today and you should NOT DRIVE or use heavy machinery until tomorrow (because of the sedation medicines used during the test).    FOLLOW UP: Our staff will call the number listed on your records the next business day following your procedure to check on you and address any questions or concerns that you may have regarding the information given to you following your procedure. If we do not reach you, we will leave a message.  However, if you are feeling well and you are not experiencing any problems, there is no need to return our call.  We will assume that you have returned to your regular daily activities without incident.  If any biopsies were taken you will be contacted by phone or by letter within the next 1-3 weeks.  Please call us at [phone] if you have not heard about the biopsies in 3 weeks.    SIGNATURES/CONFIDENTIALITY: You and/or your care partner have signed paperwork which will be entered into your electronic medical record.  These signatures attest to the fact that that the information above on your After Visit Summary has been reviewed and is understood.  Full responsibility of the confidentiality of this discharge information lies with you and/or your care-partner.  Thank you for letting us take care of your healthcare needs today.

## 2016-05-14 NOTE — Progress Notes (Signed)
A/ox3, pleased with MAC, report to RN 

## 2016-05-14 NOTE — Telephone Encounter (Signed)
error 

## 2016-05-15 ENCOUNTER — Telehealth: Payer: Self-pay | Admitting: *Deleted

## 2016-05-15 NOTE — Telephone Encounter (Signed)
No pain with swallowing liquids or eating. Her neck and throat hurts when she moves, any movement of her head hurts her throat. It hurts more near the jaw when she opens the mouth. She is able to open and close the jaw without difficulty. Advised patient to use alternating cold and warm pack. Ok to use tylenol as needed every 8 hours. If continues to have persistent pain to call us back. 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

## 2016-05-15 NOTE — Telephone Encounter (Signed)
  Follow up Call-  Call back number 05/14/2016 11/18/2015  Post procedure Call Back phone  # [phone]  Permission to leave phone message Yes Yes  Some recent data might be hidden     Patient questions:  Do you have a fever, pain , or abdominal swelling?yes Pain Score  0 *  Have you tolerated food without any problems? Yes.    Have you been able to return to your normal activities? Yes.    Do you have any questions about your discharge instructions: Diet   No. Medications  No. Follow up visit  No.  Do you have questions or concerns about your Care? Yes.    Actions: * If pain score is 4 or above: No action needed, pain <4.  Pt did c/o sore throat and very sore below both ears and down sides of neck to throat . Unable to get pain score from pt as a family member was helping to translate my questions adequately.spoke with CRNA and Tech who was in procedure room with pt yesterday to learn that jaw thrust was performed during  Procedure therefore pt was informed that under ear area and jaw area may be sore but should improve each day and if not improving should call us back. Pt's son was helping translate for pt during phone conversation.

## 2016-05-23 ENCOUNTER — Other Ambulatory Visit: Payer: Self-pay

## 2016-05-23 DIAGNOSIS — A048 Other specified bacterial intestinal infections: Secondary | ICD-10-CM

## 2016-05-23 MED ORDER — BIS SUBCIT-METRONID-TETRACYC 140-125-125 MG PO CAPS: 3.0000 | ORAL_CAPSULE | Freq: Three times a day (TID) | ORAL | 0 refills | Status: DC

## 2016-05-23 MED FILL — PRAVASTATIN NA 20 MG TAB: 20 | 30 days supply | Qty: 30 | Fill #1

## 2016-05-23 MED FILL — ?PANTOPRAZOLE SOD DR 40MG: 40 MG | 30 days supply | Qty: 60 | Fill #1

## 2016-05-23 MED FILL — METFORMIN HCL ER 500 MG TAB: 500 | 15 days supply | Qty: 30 | Fill #2

## 2016-05-23 MED FILL — GABAPENTIN 300 MG CAPSULE: 300 | 30 days supply | Qty: 90 | Fill #1

## 2016-05-23 MED FILL — NITROGLYCERIN 0.2 MG/HR PAT: 0.2 | 30 days supply | Qty: 30 | Fill #0

## 2016-05-25 ENCOUNTER — Telehealth: Payer: Self-pay | Admitting: Gastroenterology

## 2016-05-25 NOTE — Telephone Encounter (Signed)
Dr Dallie Piles is $1000 what do you want to switch the patient to

## 2016-05-28 NOTE — Telephone Encounter (Signed)
Please give her a sample to complete the 10 day course. Thanks

## 2016-05-28 NOTE — Telephone Encounter (Signed)
I have samples to give the patient

## 2016-05-29 ENCOUNTER — Ambulatory Visit: Payer: Self-pay | Attending: Internal Medicine | Admitting: Internal Medicine

## 2016-05-29 ENCOUNTER — Encounter: Payer: Self-pay | Admitting: Internal Medicine

## 2016-05-29 VITALS — BP 148/90 | HR 63 | Temp 97.5°F | Resp 16 | Wt 278.2 lb

## 2016-05-29 DIAGNOSIS — I10 Essential (primary) hypertension: Secondary | ICD-10-CM | POA: Insufficient documentation

## 2016-05-29 DIAGNOSIS — G4700 Insomnia, unspecified: Secondary | ICD-10-CM | POA: Insufficient documentation

## 2016-05-29 DIAGNOSIS — B9681 Helicobacter pylori [H. pylori] as the cause of diseases classified elsewhere: Secondary | ICD-10-CM | POA: Insufficient documentation

## 2016-05-29 DIAGNOSIS — E119 Type 2 diabetes mellitus without complications: Secondary | ICD-10-CM | POA: Insufficient documentation

## 2016-05-29 DIAGNOSIS — Z7984 Long term (current) use of oral hypoglycemic drugs: Secondary | ICD-10-CM | POA: Insufficient documentation

## 2016-05-29 DIAGNOSIS — Z888 Allergy status to other drugs, medicaments and biological substances status: Secondary | ICD-10-CM | POA: Insufficient documentation

## 2016-05-29 DIAGNOSIS — Z87442 Personal history of urinary calculi: Secondary | ICD-10-CM | POA: Insufficient documentation

## 2016-05-29 DIAGNOSIS — R400 Somnolence: Secondary | ICD-10-CM

## 2016-05-29 DIAGNOSIS — R5383 Other fatigue: Secondary | ICD-10-CM | POA: Insufficient documentation

## 2016-05-29 DIAGNOSIS — E559 Vitamin D deficiency, unspecified: Secondary | ICD-10-CM | POA: Insufficient documentation

## 2016-05-29 DIAGNOSIS — Z114 Encounter for screening for human immunodeficiency virus [HIV]: Secondary | ICD-10-CM | POA: Insufficient documentation

## 2016-05-29 DIAGNOSIS — Z79899 Other long term (current) drug therapy: Secondary | ICD-10-CM | POA: Insufficient documentation

## 2016-05-29 DIAGNOSIS — K2970 Gastritis, unspecified, without bleeding: Secondary | ICD-10-CM | POA: Insufficient documentation

## 2016-05-29 LAB — GLUCOSE, POCT (MANUAL RESULT ENTRY): POC Glucose: 118 mg/dl — AB (ref 70–99)

## 2016-05-29 LAB — POCT GLYCOSYLATED HEMOGLOBIN (HGB A1C): Hemoglobin A1C: 5.6

## 2016-05-29 MED ORDER — METFORMIN HCL ER 500 MG PO TB24: 500.0000 mg | ORAL_TABLET | Freq: Every day | ORAL | 3 refills | Status: DC

## 2016-05-29 MED ORDER — HYDROCHLOROTHIAZIDE 12.5 MG PO TABS: 12.5000 mg | ORAL_TABLET | Freq: Every day | ORAL | 3 refills | Status: DC

## 2016-05-29 MED FILL — ?HYDROCHLOROTHIAZIDE 12.5MG: 12.5 | 30 days supply | Qty: 30 | Fill #0

## 2016-05-29 MED FILL — METFORMIN HCL ER 500 MG TAB: 500 | 30 days supply | Qty: 30 | Fill #0

## 2016-05-29 NOTE — Telephone Encounter (Signed)
Patient came and picked up samples today went over instructions with patient

## 2016-05-29 NOTE — Patient Instructions (Addendum)
Apnea del sueo  ??  - need sleep study (Sleep Apnea) La apnea del sueo es un trastorno que afecta la respiracin. Las Engineer, manufacturing con apnea del sueo tienen momentos en los que hacen breves pausas al respirar o en los que respiran de forma superficial mientras duermen. La apnea del sueo puede causar estos sntomas:  Dificultad para quedarse dormido.  Sueo o cansancio Agricultural consultant.  Irritabilidad.  Ronquidos fuertes.  Dolores de cabeza matutinos.  Dificultad para concentrarse.  Olvido de cosas.  Menos inters sexual.  Somnolencia sin motivo.  Cambios en el estado de nimo.  Cambios en la personalidad.  Depresin.  Muchos despertares nocturnos para orinar.  [employer].  Dolor de Investment banker, operational. CUIDADOS EN EL HOGAR  Haga los cambios en su rutina que le haya recomendado el mdico.  Consuma una dieta sana y Bryson Dames equilibrada.  Tome los medicamentos de venta libre y los recetados solamente como se lo haya indicado el mdico.  Evite el alcohol, los calmantes (sedantes) y los medicamentos opiceos.  Si tiene sobrepeso, tome medidas para bajar de [place].  Si le proporcionaron un dispositivo para usar mientras duerme, selo solamente como se lo haya indicado el mdico.  No consuma ningn producto que contenga tabaco, lo que incluye cigarrillos, tabaco de Higher education careers adviser y Psychologist, sport and exercise. Si necesita ayuda para dejar de fumar, consulte al mdico.  Concurra a todas las visitas de control como se lo haya indicado el mdico. Esto es importante. SOLICITE AYUDA SI:  El dispositivo que recibi para usar mientras duerme es incmodo o parece no funcionar.  Los sntomas no mejoran.  Los sntomas empeoran. SOLICITE AYUDA DE INMEDIATO SI:  Le duele el pecho.  Tiene dificultad para inhalar suficiente aire (falta de aire).  Tiene molestias en la espalda, en los brazos o en el [place].  Presenta dificultad para hablar.  Siente debilidad en un lado del cuerpo.  Se la cae un  lado de la cara. Estos sntomas pueden Sales executive. No espere hasta que los sntomas desaparezcan. Solicite atencin mdica de inmediato. Comunquese con el servicio de emergencias de su localidad (911 en los [place]). No conduzca por sus propios medios [employer]. Esta informacin no tiene Marine scientist el consejo del mdico. Asegrese de hacerle al mdico cualquier pregunta que tenga. Document Released: 03/31/2010 Document Revised: 06/20/2015 Document Reviewed: 12/06/2014 Elsevier Interactive Patient Education  2017 [place] (Insomnia) El insomnio es un trastorno del sueo que causa dificultades para conciliar el sueo o para [place]. Puede producir cansancio (fatiga), falta de energa, dificultad para concentrarse, cambios en el estado de nimo y mal rendimiento escolar o laboral. Hay tres formas diferentes de clasificar el insomnio:  Dificultad para conciliar el sueo.  Dificultad para mantener el sueo.  Despertar muy precoz por la maana. Cualquier tipo de insomnio puede ser a Barrister's clerk (crnico) o a Control and instrumentation engineer (agudo). Ambos son frecuentes. Generalmente, el insomnio a corto plazo dura tres meses o menos tiempo. El crnico ocurre al menos tres veces por semana durante ms de tres meses. CAUSAS El insomnio puede deberse a otra afeccin, situacin o sustancia, por ejemplo:  Ansiedad.  Algunos medicamentos.  Enfermedad por reflujo gastroesofgico (ERGE) u otras enfermedades gastrointestinales.  Asma y otras enfermedades respiratorias.  Sndrome de las piernas inquietas, apnea del sueo u otros trastornos del sueo.  Dolor crnico.  Menopausia, que puede incluir calores repentinos.  Ictus.  Consumo excesivo de alcohol, tabaco u drogas ilegales.  Depresin.  Cafena.  Trastornos neurolgicos, como enfermedad de Alzheimer.  Hiperactividad tiroidea (hipertiroidismo). Es posible que la causa del insomnio no se  conozca. FACTORES DE RIESGO Los factores de riesgo de tener insomnio incluyen lo siguiente:  El sexo. La mujeres se ven ms afectadas que los hombres.  La edad. El insomnio es ms frecuente a medida que una persona envejece.  El estrs. Esto puede incluir su vida profesional o personal.  Los ingresos. El insomnio es ms frecuente en las personas cuyos ingresos son ms bajos.  La falta de actividad fsica.  Los horarios de trabajo irregulares o los turnos nocturnos.  Los viajes a lugares de diferentes zonas horarias. SIGNOS Y SNTOMAS Si tiene insomnio, el sntoma principal es la dificultad para conciliar el sueo o mantenerlo. Esto puede derivar en otros sntomas, por ejemplo:  Sentirse fatigado.  Ponerse nervioso por [employer] irse a dormir.  No sentirse descansado por la maana.  Tener dificultad para concentrarse.  Sentirse irritable, ansioso o deprimido. TRATAMIENTO El tratamiento para el insomnio depende de la causa. Si se debe a una enfermedad preexistente, el tratamiento se centrar en el abordaje de la enfermedad. El tratamiento tambin puede incluir lo siguiente:  Medicamentos que lo ayuden a dormir.  Asesoramiento psicolgico o terapia.  Cambios en el estilo de vida. [place] los medicamentos solamente como se lo haya indicado el mdico.  Establezca horarios habituales para dormir y Clinical cytogeneticist. No tome siestas.  Lleve un registro del sueo ya que podra ser de utilidad para que usted y a su mdico puedan determinar qu podra estar causndole insomnio. Incluya lo siguiente:  Cundo duerme.  Cundo se despierta durante la noche.  Qu tan bien duerme.  Qu tan relajado se siente al da siguiente.  Cualquier efecto secundario de los medicamentos que toma.  Lo que usted come y bebe.  Convierta a su habitacin en un lugar cmodo donde sea fcil conciliar el sueo:  Coloque persianas o cortinas especiales oscuras  que impidan la entrada de la luz del exterior.  Para bloquear los ruidos, use un aparato que reproduce sonidos ambientales o relajantes de fondo.  Mantenga baja la temperatura.  Haga ejercicio regularmente como se lo haya indicado el mdico. No haga ejercicio justo antes de la hora de [place].  Utilice tcnicas de relajacin para controlar el estrs. [place] al mdico que le sugiera algunas tcnicas que sean adecuadas para usted. Estos pueden incluir lo siguiente:  Ejercicios de respiracin.  Rutinas para aliviar la tensin muscular.  Visualizacin de escenas apacibles.  Disminucin del consumo de alcohol, bebidas con cafena y cigarrillos, especialmente cerca de la hora de [place], ya que pueden perturbarle el sueo.  No coma en exceso ni consuma comidas picantes justo antes de la hora de [place]. Esto puede causarle molestias digestivas y dificultades para dormir.  Limite el uso de pantallas antes de la hora de [place]. Esto incluye lo siguiente:  Mirar televisin.  Usar el telfono inteligente, la tableta y la computadora.  Siga una rutina. Esto puede ayudarlo a conciliar el sueo ms rpidamente. Intente hacer una actividad tranquila, cepillarse los dientes e irse a la cama a la misma hora todas las noches.  Levntese de la cama si sigue despierto despus de 60minutos de haber intentado dormirse. [place] luces, pero intente leer o hacer una actividad tranquila. Cuando tenga sueo, regrese a Futures trader.  Conduzca con cuidado. No conduzca si est muy somnoliento.  Concurra a [place]  de control, segn le indique su mdico. Esto es importante. SOLICITE ATENCIN MDICA SI:  Est cansado durante el da o tiene dificultades en su rutina diaria debido a la somnolencia.  Sigue teniendo problemas para dormir o Press photographer. SOLICITE ATENCIN MDICA DE INMEDIATO SI:  Tiene pensamientos serios acerca de lastimarse a usted mismo o daar a Nurse, children's. Esta  informacin no tiene Marine scientist el consejo del mdico. Asegrese de hacerle al mdico cualquier pregunta que tenga. Document Released: 02/26/2005 Document Revised: 03/19/2014 Document Reviewed: 11/27/2013 Elsevier Interactive Patient Education  2017 Elsevier Inc.  -   Infeccin por Helicobacter Pylori (Helicobacter Pylori Infection) La infeccin por Helicobacter pylori es una infeccin en el estmago que es causada por la bacteria Helicobacter pylori (H. pylori). Este tipo de bacteria vive frecuentemente en el revestimiento del estmago. La infeccin puede causar lceras e irritacin (gastritis) en algunas personas. Es la causa ms comn de lceras en el estmago (lcera gstrica) y en la parte superior del intestino (lcera duodenal). Tener esta infeccin tambin puede aumentar el riesgo de cncer de Paramedic y un tipo de cncer de los glbulos blancos (linfoma) que afecta al [place]. CAUSAS H. pylori es un tipo de bacteria que se encuentra frecuentemente en el estmago de las personas saludables. La bacteria puede pasar de Ardelia Mems persona a otra por contacto a travs de las heces o la saliva. No se sabe por qu algunas personas desarrollan lceras, gastritis o cncer a partir de la infeccin. FACTORES DE RIESGO Es ms probable que esta afeccin se manifieste en las personas que:  Tienen familiares con esta infeccin.  Viven con [employer]; por ejemplo, en un dormitorio.  Son de origen africano, hispano o [place] con esta infeccin no tienen sntomas. Si tiene sntomas, estos pueden incluir los siguientes:  Merchant navy officer.  Dolor de [place].  Nuseas.  Vmitos.  Sangre en el vmito.  Prdida del apetito.  Mal aliento. DIAGNSTICO Esta afeccin se puede diagnosticar en funcin de los sntomas, un examen fsico y Pharmacist, community. Podrn solicitarle otros estudios, por ejemplo:  Anlisis de sangre o pruebas de materia  fecal para verificar las protenas (anticuerpos) que el cuerpo puede producir en respuesta a las bacterias. Estas pruebas son la mejor manera de confirmar el diagnstico.  Ardelia Mems prueba de aliento para verificar el tipo de gas que la bacteria H. pylori libera despus de descomponer una sustancia llamada urea. Para la prueba, se le pide que beba urea. Esta prueba se realiza frecuentemente despus del tratamiento para saber si el tratamiento funcion.  Un procedimiento en el que un tubo delgado y flexible con una pequea cmara en el extremo se coloca en el estmago y el intestino superior (endoscopia superior). El mdico tambin puede tomar muestras de tejido (biopsia) para Optometrist pruebas de H. pylori y cncer. TRATAMIENTO El tratamiento para esta afeccin generalmente implica tomar una combinacin de medicamentos (terapia triple) durante un par de semanas. La terapia triple incluye un medicamento para reducir el cido en el estmago y dos tipos de antibiticos. Se aprobaron muchas combinaciones de frmacos para el tratamiento. El tratamiento generalmente mata a la H. pylori y reduce el riesgo de cncer. Despus del tratamiento, es posible que deba volver a realizarse una prueba de H. pylori. En algunos casos, es posible que sea necesario repetir Dispensing optician. [place] los medicamentos de venta libre y los recetados solamente como se lo haya indicado el  mdico.  Tome los antibiticos como se lo haya indicado el mdico. No deje de tomar los antibiticos aunque comience a sentirse mejor.  Puede retomar todas sus actividades habituales y Clinical biochemist su dieta habitual.  Tome estas medidas para evitar infecciones futuras:  Lvese las manos con frecuencia.  Asegrese de que los alimentos que consume se hayan preparado adecuadamente.  Beba agua solamente de fuentes limpias.  Concurra a todas las visitas de control como se lo haya indicado el mdico. Esto es  importante. SOLICITE ATENCIN MDICA SI:  Los sntomas no mejoran.  Los sntomas regresan despus del tratamiento. Esta informacin no tiene Marine scientist el consejo del mdico. Asegrese de hacerle al mdico cualquier pregunta que tenga. Document Released: 06/20/2015 Document Revised: 06/20/2015 Document Reviewed: 03/10/2014 Elsevier Interactive Patient Education  2017 [place] de alimentacin con bajo contenido de sodio (Low-Sodium Eating Plan) El sodio aumenta la presin arterial y hace que el cuerpo retenga lquidos. El consumo de alimentos con menos sodio ayuda a Armed forces technical officer presin arterial, a Forensic psychologist y a Tour manager, el hgado y los riones. Agregar sal (cloruro de sodio) a los alimentos aumenta el aporte de [place]. La mayor parte del sodio proviene de los alimentos enlatados, envasados y congelados. La pizza, la comida rpida y la comida de los restaurantes tambin contienen mucho sodio. Aunque usted tome medicamentos para bajar la presin arterial o reducir el lquido del cuerpo, es importante que disminuya el aporte de sodio de los alimentos. EN QU CONSISTE EL PLAN? La [employer] de las personas deberan limitar la ingesta de sodio a 2300mg  por Training and development officer. El mdico le recomienda que limite su consumo de sodio a 000mg   por Training and development officer. QU DEBO SABER ACERCA DE ESTE PLAN DE [place]? Para el plan de alimentacin con bajo contenido de sodio, debe seguir estas pautas generales:  Elija alimentos con un valor porcentual diario de sodio de menos del 5% (segn se indica en la etiqueta).  Use hierbas o aderezos sin sal, en lugar de sal de mesa o sal marina.  Consulte al mdico o farmacutico antes de usar sustitutos de la sal.  Coma alimentos frescos.  Coma ms frutas y verduras.  Limite las verduras enlatadas. Si las consume, enjuguelas bien para disminuir el sodio.  Limite el consumo de queso a 1onza (28g) por Training and development officer.  Coma productos con  bajo contenido de sodio, cuya etiqueta suele decir "bajo contenido de sodio" o "sin agregado de sal".  Evite alimentos que contengan glutamato monosdico (MSG), que a veces se agrega a la comida [place] y a algunos alimentos enlatados.  Consulte las etiquetas de los alimentos (etiquetas de informacin nutricional) para saber cunto sodio contiene una porcin.  Consuma ms comida casera y menos de restaurante, de buf y comida rpida.  Cuando coma en un restaurante, pida que preparen su comida con menos sal o, en lo posible, sin nada de sal. CMO LEO LA INFORMACIN SOBRE EL SODIO EN LAS ETIQUETAS DE LOS ALIMENTOS? La etiqueta de informacin nutricional indica la cantidad de sodio en una porcin de alimento. Si come ms de una porcin, debe multiplicar la cantidad indicada de sodio por la cantidad de porciones. Las etiquetas de los alimentos tambin pueden indicar lo siguiente:  Sin sodio: menos de 5mg  por porcin.  Cantidad muy baja de sodio: 35mg  o menos por porcin.  Cantidad baja de sodio: 140mg  o menos por porcin.  Menor cantidad de sodio: 50% menos de sodio en una porcin.  Por ejemplo, si un alimento generalmente contiene 300 mg de sodio se modifica para ser [employer], tendr 150 mg de sodio.  Sodio reducido: 25% menos de Agricultural consultant. Por ejemplo, si un alimento que por lo general contiene 400mg  de sodio se modifica para convertirse en un alimento de sodio reducido, tendr 300mg  de sodio. QU ALIMENTOS PUEDO COMER? Cereales Cereales con bajo contenido de sodio, como [place], arroz y trigo [place], y trigo triturado. Galletas con bajo contenido de [place]. Arroz y pastas sin sal. Pan con bajo contenido de [place]. Verduras Verduras frescas o congeladas. Verduras enlatadas con bajo contenido de sodio o reducido de sodio. Pasta y salsa de tomate con contenido bajo o [place]. Jugos de tomate y verduras con contenido bajo o reducido de  sodio. Lambert Mody Frutas frescas, congeladas y IT sales professional. Jugo de frutas. Carnes y otros productos con protenas Atn y salmn enlatado con bajo contenido de [place]. Carne de vaca o ave, pescado y frutos de mar frescos o congelados. Cordero. Frutos secos sin sal. Lentejas, frijoles y guisantes secos, sin sal agregada. Frijoles enlatados sin sal. Sopas caseras sin sal. Huevos. Lcteos Leche. Leche de soja. Queso ricota. Quesos con contenido bajo o reducido de sodio. Yogur. Condimentos Hierbas y especias frescas y secas. Aderezos sin sal. [place] y ajo en polvo. Variedades de [place] y ketchup con bajo contenido de sodio. Rbano picante fresco o refrigerado. Jugo de limn. Grasas y aceites Aderezos para ensalada con contenido reducido de [place]. Mantequilla sin sal. Otros Palomitas de maz y pretzels sin sal. Los artculos mencionados arriba pueden no ser [employer] de las bebidas o los alimentos recomendados. Comunquese con el nutricionista para conocer ms opciones. QU ALIMENTOS NO SE RECOMIENDAN? Cereales Cereales instantneos para comer caliente. Mezclas para bizcochos, panqueques y rellenos de pan. Crutones. Mezclas para pastas o arroz con condimento. Envases comerciales de sopa de fideos. Macarrones con queso envasados o congelados. Harina leudante. Galletas saladas comunes. Verduras Verduras enlatadas comunes. Pasta y salsa de tomate en lata comunes. Jugos comunes de tomate y de verduras. Verduras Surveyor, minerals. Papas fritas saladas. Aceitunas. Pepinillos. Salsas. Chucrut. Salsa. Carnes y otros productos con protenas Carne de vaca, pescado o frutos de mar que est salada, [place], [employer], condimentada con especias o con pickles. Panceta, jamn, salchichas, perros calientes, carne curada, carne picada (carne envasada de buey) y embutidos. Cerdo salado. Cecina o charqui. Arenque en escabeche. Anchoas, atn enlatado comn y sardinas. Frutos secos con sal. Ines Bloom[place] para  untar y quesos procesados. Requesn. Queso azul y cottage. Suero de [place]. Condimentos Sal de cebolla y ajo, sal condimentada, sal de mesa y sal marina. Salsas en lata y envasadas. Salsa [place]. Salsa trtara. Salsa barbacoa. Salsa teriyaki. Salsa de soja, incluso la que tiene contenido reducido de [place]. Salsa de carne. Salsa de pescado. Salsa de [place]. Salsa rosada. Rbano picante envasado. Ketchup y mostaza comunes. Saborizantes y tiernizantes para carne. Caldo en cubitos. Salsa picante. Salsa tabasco. Adobos. Aderezos para tacos. Salsas. Grasas y aceites Aderezos comunes para ensalada. [place] con sal. Margarina. Mantequilla clarificada. Grasa de panceta. Otros Nachos y papas fritas envasadas. Maz inflado y frituras de maz. Palomitas de maz y pretzels con sal. Sopas enlatadas o en polvo. Pizza. Pasteles y entradas congeladas. Los artculos mencionados arriba pueden no ser [employer] de las bebidas y los alimentos que se Higher education careers adviser. Comunquese con el nutricionista para obtener ms informacin. Esta informacin no tiene Marine scientist el consejo del mdico.  Asegrese de hacerle al mdico cualquier pregunta que tenga. Document Released: 02/26/2005 Document Revised: 03/19/2014 Document Reviewed: 12/31/2012 Elsevier Interactive Patient Education  2017 Elsevier Inc.  -   Hipertensin Hypertension El trmino hipertensin es otra forma de denominar a la presin arterial elevada. La presin arterial elevada fuerza al corazn a trabajar ms para bombear la sangre. Esto puede causar problemas con el paso del [place]. Una lectura de presin arterial est compuesta por 2 nmeros. Hay un nmero superior (sistlico) sobre un nmero inferior (diastlico). Lo ideal es tener la presin arterial por debajo de 120/80. Las decisiones saludables pueden ayudarle a disminuir su presin arterial. Es posible que necesite medicamentos que le ayuden a disminuir su presin arterial si:  Su  presin arterial no disminuye mediante decisiones saludables.  Su presin arterial est por encima de 130/80. Siga estas instrucciones en su casa: Comida y bebida   Si se lo indican, siga el plan de alimentacin de DASH (Dietary Approaches to Stop Hypertension, Maneras de alimentarse para detener la hipertensin). Esta dieta incluye:  Que la mitad del plato de cada comida sea de frutas y verduras.  Que un cuarto del plato de cada comida sea de cereales integrales. Los cereales integrales incluyen pasta integral, arroz integral y pan integral.  Comer y beber productos lcteos con bajo contenido de [place], como leche descremada o yogur bajo en grasas.  Que un cuarto del plato de cada comida sea de protenas bajas en grasa [place]). Las protenas bajas en grasa incluyen pescado, pollo sin piel, huevos, frijoles y tofu.  Evitar consumir carne grasa, carne curada y procesada, o pollo con piel.  Evitar consumir alimentos prehechos o procesados.  Consuma menos de 1500 mg de sal (sodio) por da.  Limite el consumo de alcohol a no ms de 1 medida por da si es mujer y no est Music therapist y a 2 medidas por da si es hombre. Una medida equivale a 12onzas de cerveza, 5onzas de vino o 1onzas de bebidas alcohlicas de alta graduacin. Estilo de vida   Trabaje con su mdico para mantenerse en un peso saludable o para perder peso. Pregntele a su mdico cul es el peso recomendable para usted.  Realice al menos 30 minutos de ejercicio que haga que se acelere su corazn (ejercicio Arboriculturist) la [employer] de la [place]. Estos pueden incluir caminar, nadar o andar en bicicleta.  Realice al menos 30 minutos de ejercicio que fortalezca sus msculos (ejercicios de resistencia) al menos 3 das a la [place]. Estos pueden incluir levantar pesas o hacer pilates.  No consuma ningn producto que contenga nicotina o tabaco. Esto incluye cigarrillos y cigarrillos electrnicos. Si necesita ayuda para dejar  de fumar, consulte al [employer].  Controle su presin arterial en su casa tal como le indic el mdico.  Concurra a todas las visitas de control como se lo haya indicado el mdico. Esto es importante. Medicamentos   [employer] de venta libre y los recetados solamente como se lo haya indicado el mdico. Siga cuidadosamente las indicaciones.  No omita las dosis de medicamentos para la presin arterial. Los medicamentos pierden eficacia si omite dosis. El hecho de omitir las dosis tambin [place] el riesgo de otros problemas.  Pregntele a su mdico a qu efectos secundarios o reacciones a los Careers information officer. Comunquese con un mdico si:  Piensa que tiene [place] reaccin a los medicamentos que est tomando.  Tiene dolores de cabeza frecuentes (recurrentes).  Siente mareos.  Tiene hinchazn  en los tobillos.  Tiene problemas de visin. Solicite ayuda de inmediato si:  Siente un dolor de cabeza muy intenso.  Comienza a sentirse confundido.  Se siente dbil o adormecido.  Siente que va a desmayarse.  Siente un dolor muy intenso en:  El pecho.  El vientre (abdomen).  Devuelve (vomita) ms de una vez.  Tiene dificultad para respirar. Resumen  El trmino hipertensin es otra forma de denominar a la presin arterial elevada.  Las decisiones saludables pueden ayudarle a disminuir su presin arterial. Si no puede controlar su presin arterial mediante decisiones saludables, es posible que deba tomar medicamentos. Esta informacin no tiene Marine scientist el consejo del mdico. Asegrese de hacerle al mdico cualquier pregunta que tenga. Document Released: 08/16/2009 Document Revised: 02/08/2016 Document Reviewed: 02/08/2016 Elsevier Interactive Patient Education  2017 [employer].

## 2016-05-29 NOTE — Progress Notes (Signed)
Amanda Santiago, is a 51 y.o. female  AVW:[id number]  WGN:[id number]  DOB - 10/14/1965  Chief Complaint  Patient presents with  . Fatigue  . Follow-up        Subjective:   Amanda Santiago is a 51 y.o. female here today for a follow up visit of ruq abd pains, hx of dm, morbid obesity.  Still persist. Had egd 05/14/16, found to have h pylori gastritis, but pt was unable to pick up rx.   Reviewed notes w/ her, gi office has samples of the abx and to go by and pick up.    Pt also notes problems w/ daytime somnolence as well. Does not have problem getting to sleep, but has hard time staying asleep. Last few days has only slept few hours.   Patient has No headache, No chest pain, No Nausea, No new weakness tingling or numbness, No Cough - SOB. Co of bone aches as well, diffusely.  No problems updated.  ALLERGIES: Allergies  Allergen Reactions  . Aspirin Swelling  . Morphine And Related Swelling  . Shellfish-Derived Products Swelling  . Alka-Seltzer [Aspirin Effervescent] Other (See Comments)    unknown    PAST MEDICAL HISTORY: Past Medical History:  Diagnosis Date  . Arthritis   . Diabetes mellitus without complication ([place])    takes glucaphage  . 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   . Kidney stone 9-10 years ago  . Kidney stone   . Morbid obesity ([place]) 04/06/2014   BMI 50.6   . Pain in joint, ankle and foot 04/06/2014   LEFT MRI 10/1015  . Sternal pain 06/21/2014    MEDICATIONS AT HOME: Prior to Admission medications   Medication Sig Start Date End Date Taking? Authorizing Provider  bismuth-metronidazole-tetracycline [facility]) [phone] MG capsule Take 3 capsules by mouth 4 (four) times daily -  before meals and at bedtime. Take for 14 days until gone then stop taking Pantoprazole also. 05/23/16 06/06/16  Mauri Pole, MD  diclofenac sodium (VOLTAREN) 1 % GEL Apply 2  g topically 4 (four) times daily. 04/04/16   Maren Reamer, MD  hydrochlorothiazide (HYDRODIURIL) 12.5 MG tablet Take 1 tablet (12.5 mg total) by mouth daily. 05/29/16   Maren Reamer, MD  metFORMIN (GLUCOPHAGE-XR) 500 MG 24 hr tablet Take 1 tablet (500 mg total) by mouth daily with breakfast. 05/29/16   Maren Reamer, MD  nitroGLYCERIN (NITRODUR - DOSED IN MG/24 HR) 0.2 mg/hr patch Cut patch into one - fourth pieces Place a one fourth piece of patch on  skin over affected area, changing to a new piece every 24 hours. 10/21/15   Dickie La, MD  pantoprazole (PROTONIX) 40 MG tablet Take 1 tablet (40 mg total) by mouth 2 (two) times daily before a meal. 04/04/16   Maren Reamer, MD  pravastatin (PRAVACHOL) 20 MG tablet Take 1 tablet (20 mg total) by mouth at bedtime. 04/04/16   Maren Reamer, MD     Objective:   Vitals:   05/29/16 1100  BP: (!) 148/90  Pulse: 63  Resp: 16  Temp: 97.5 F (36.4 C)  TempSrc: Oral  SpO2: 97%  Weight: 278 lb 3.2 oz (126.2 kg)    Exam General appearance : Awake, alert, not in any distress. Speech Clear. Not toxic looking, morbid obese, pleasant. HEENT: Atraumatic and Normocephalic, pupils equally reactive to light. Neck: supple, no JVD. No cervical lymphadenopathy.  Chest:Good air entry bilaterally, no added sounds. CVS: S1 S2 regular, no murmurs/gallups or rubs. Abdomen: Bowel sounds active, Non tender and not distended with no gaurding, rigidity or rebound. Extremities: B/L Lower Ext shows no edema, both legs are warm to touch Neurology: Awake alert, and oriented X 3, CN II-XII grossly intact, Non focal Skin:No Rash  Data Review Lab Results  Component Value Date   HGBA1C 5.6 05/29/2016   HGBA1C 5.8 12/06/2015   HGBA1C 6.5 08/29/2015    Depression screen PHQ 2/9 05/29/2016 04/17/2016 04/04/2016 12/30/2015 12/06/2015  Decreased Interest 1 1 1  0 0  Down, Depressed, Hopeless 1 1 1  0 0  PHQ - 2 Score 2 2 2  0 0  Altered sleeping 1 1 2  - 1    Tired, decreased energy 3 1 1  - 1  Change in appetite 3 2 1  - 0  Feeling bad or failure about yourself  2 0 1 - 0  Trouble concentrating 1 0 0 - 0  Moving slowly or fidgety/restless 1 0 0 - 0  Suicidal thoughts 0 0 0 - 0  PHQ-9 Score 13 6 7  - 2      Assessment & Plan   1. Diabetes mellitus without complication ([place]) - doing very well on metformin, keep 500 qd - POCT glucose (manual entry) - POCT glycosylated hemoglobin (Hb A1C) 5.6    2. hpylori gastritis, as cause of ruq abd pains? Currently seeing gi and gs, appreciate all assistance - encourage pt to go to gi office today to pick up h pylori sample pack.  3. htn - elevated bp 2 separate occassions (today and in gs office 05/03/16) - low salt diet discussed, info provided - start hctz 12.5 qd for now.  4. Vitamin D deficiency - bone pains - VITAMIN D 25 Hydroxy (Vit-D Deficiency, Fractures)  5. Insomnia, unspecified type, w/ Daytime somnolence, suspect osa - Split night study; Future  6. Encounter for screening for HIV - HIV antibody (with reflex)   Patient have been counseled extensively about nutrition and exercise  Return in about 2 months (around 07/29/2016) for osa/htn.  The patient was given clear instructions to go to ER or return to medical center if symptoms don't improve, worsen or new problems develop. The patient verbalized understanding. The patient was told to call to get lab results if they haven't heard anything in the next week.   This note has been created with Surveyor, quantity. Any transcriptional errors are unintentional.   Maren Reamer, MD, [facility] [place], [place]   05/29/2016, 12:13 PM

## 2016-05-30 ENCOUNTER — Other Ambulatory Visit: Payer: Self-pay | Admitting: Internal Medicine

## 2016-05-30 LAB — VITAMIN D 25 HYDROXY (VIT D DEFICIENCY, FRACTURES): Vit D, 25-Hydroxy: 19 ng/mL — ABNORMAL LOW (ref 30–100)

## 2016-05-30 MED ORDER — VITAMIN D (ERGOCALCIFEROL) 1.25 MG (50000 UNIT) PO CAPS: 50000.0000 [IU] | ORAL_CAPSULE | ORAL | 0 refills | Status: DC

## 2016-05-30 MED FILL — VIT D2 1.25 MG (50,000 UNIT: 1.25 MG | 84 days supply | Qty: 12 | Fill #0

## 2016-05-31 ENCOUNTER — Telehealth: Payer: Self-pay

## 2016-05-31 NOTE — Telephone Encounter (Signed)
CMA call to go over lab results  Patient Verify DOB  Patient was aware and understood   

## 2016-05-31 NOTE — Telephone Encounter (Signed)
-----   Message from Maren Reamer, MD sent at 05/30/2016  8:49 AM EDT ----- Please call. hiv neg, vit d very low. Please call.  Vit D very low. Very low vit d, can cause bone/muscle pain.  rx for Vit D replacement in chart, take weekly.  Once done, buy OTC Vit D 5,000 IU and take daily. She also needs to take Calcium 1200mg  /daily for bone health w/ the Vit D.  thx

## 2016-06-01 ENCOUNTER — Encounter: Payer: Self-pay | Admitting: Family Medicine

## 2016-06-01 ENCOUNTER — Ambulatory Visit (INDEPENDENT_AMBULATORY_CARE_PROVIDER_SITE_OTHER): Payer: Self-pay | Admitting: Family Medicine

## 2016-06-01 VITALS — BP 137/82 | Ht 66.0 in | Wt 278.0 lb

## 2016-06-01 DIAGNOSIS — M170 Bilateral primary osteoarthritis of knee: Secondary | ICD-10-CM

## 2016-06-01 MED ORDER — METHYLPREDNISOLONE ACETATE 40 MG/ML IJ SUSP
40.0000 mg | Freq: Once | INTRAMUSCULAR | Status: AC
  Administered 2016-06-01: 40 mg via INTRA_ARTICULAR

## 2016-06-02 LAB — HIV ANTIBODY (ROUTINE TESTING W REFLEX): HIV: NONREACTIVE

## 2016-06-03 NOTE — Progress Notes (Signed)
She is here for repeat bilateral knee injections.  She got great relief last time for about 8 weeks.  She continues to struggle with her weight. INJECTION: Patient was given informed consent, signed copy in the chart. Appropriate time out was taken. Area prepped and draped in usual sterile fashion. 1 cc of methylprednisolone 40 mg/ml plus  4 cc of 1% lidocaine without epinephrine was injected into the bilateral knees using a(n) anterior medial approach. The patient tolerated the procedure well. There were no complications. Post procedure instructions were given.

## 2016-07-06 ENCOUNTER — Encounter (INDEPENDENT_AMBULATORY_CARE_PROVIDER_SITE_OTHER): Payer: Self-pay | Admitting: Orthopedic Surgery

## 2016-07-06 ENCOUNTER — Ambulatory Visit (INDEPENDENT_AMBULATORY_CARE_PROVIDER_SITE_OTHER): Payer: Self-pay | Admitting: Orthopedic Surgery

## 2016-07-06 DIAGNOSIS — M170 Bilateral primary osteoarthritis of knee: Secondary | ICD-10-CM

## 2016-07-06 MED ORDER — HYALURONAN 88 MG/4ML IX SOSY
88.0000 mg | PREFILLED_SYRINGE | INTRA_ARTICULAR | Status: AC | PRN
  Administered 2016-07-06: 88 mg via INTRA_ARTICULAR

## 2016-07-06 MED ORDER — LIDOCAINE HCL 1 % IJ SOLN
5.0000 mL | INTRAMUSCULAR | Status: AC | PRN
  Administered 2016-07-06: 5 mL

## 2016-07-06 NOTE — Progress Notes (Signed)
   Procedure Note  Patient: Amanda Santiago             Date of Birth: [birth year]           MRN: [id number]             Visit Date: 07/06/2016  Procedures: Visit Diagnoses: Primary osteoarthritis of both knees  Large Joint Inj Date/Time: 07/06/2016 10:17 PM Performed by: Meredith Pel Authorized by: Meredith Pel   Consent Given by:  Patient Site marked: the procedure site was marked   Timeout: prior to procedure the correct patient, procedure, and site was verified   Indications:  Pain, joint swelling and diagnostic evaluation Location:  Knee Site:  R knee Prep: patient was prepped and draped in usual sterile fashion   Needle Size:  18 G Needle Length:  1.5 inches Approach:  Superolateral Ultrasound Guidance: No   Fluoroscopic Guidance: No   Arthrogram: No   Medications:  5 mL lidocaine 1 %; 88 mg Hyaluronan 88 MG/4ML Patient tolerance:  Patient tolerated the procedure well with no immediate complications Large Joint Inj Date/Time: 07/06/2016 10:17 PM Performed by: Meredith Pel Authorized by: Meredith Pel   Consent Given by:  Patient Site marked: the procedure site was marked   Timeout: prior to procedure the correct patient, procedure, and site was verified   Indications:  Pain, joint swelling and diagnostic evaluation Location:  Knee Site:  L knee Prep: patient was prepped and draped in usual sterile fashion   Needle Size:  18 G Needle Length:  1.5 inches Approach:  Superolateral Ultrasound Guidance: No   Fluoroscopic Guidance: No   Arthrogram: No   Medications:  5 mL lidocaine 1 %; 88 mg Hyaluronan 88 MG/4ML Patient tolerance:  Patient tolerated the procedure well with no immediate complications

## 2016-07-10 ENCOUNTER — Other Ambulatory Visit: Payer: Self-pay | Admitting: Internal Medicine

## 2016-07-10 MED FILL — PRAVASTATIN NA 20 MG TAB: 20 | 30 days supply | Qty: 30 | Fill #2

## 2016-07-10 MED FILL — METFORMIN HCL ER 500 MG TAB: 500 | 30 days supply | Qty: 30 | Fill #1

## 2016-07-10 MED FILL — ?HYDROCHLOROTHIAZIDE 12.5MG: 12.5 | 30 days supply | Qty: 30 | Fill #1

## 2016-07-11 MED FILL — PANTOPRAZOLE SOD DR 40 MG T: 40 | 30 days supply | Qty: 30 | Fill #0

## 2016-07-26 ENCOUNTER — Encounter: Payer: Self-pay | Admitting: Internal Medicine

## 2016-07-27 ENCOUNTER — Encounter: Payer: Self-pay | Admitting: Internal Medicine

## 2016-07-29 ENCOUNTER — Ambulatory Visit (HOSPITAL_BASED_OUTPATIENT_CLINIC_OR_DEPARTMENT_OTHER): Payer: Self-pay | Attending: Internal Medicine | Admitting: Internal Medicine

## 2016-07-29 VITALS — Ht 66.0 in | Wt 280.0 lb

## 2016-07-29 DIAGNOSIS — R400 Somnolence: Secondary | ICD-10-CM | POA: Insufficient documentation

## 2016-07-29 DIAGNOSIS — G4733 Obstructive sleep apnea (adult) (pediatric): Secondary | ICD-10-CM | POA: Insufficient documentation

## 2016-07-30 ENCOUNTER — Encounter: Payer: Self-pay | Admitting: Internal Medicine

## 2016-08-04 DIAGNOSIS — R400 Somnolence: Secondary | ICD-10-CM

## 2016-08-04 NOTE — Procedures (Signed)
  Patient Name: Amanda Santiago, Tortorelli Date: 07/29/2016 Gender: Female D.O.B: 11-17-1965 Age (years): 32 Referring Provider: Maren Reamer Height (inches): 109 Interpreting Physician: Baird Lyons MD, ABSM Weight (lbs): 280 RPSGT: Baxter Flattery BMI: 44 MRN: [id number] Neck Size: 17.50 CLINICAL INFORMATION Sleep Study Type: NPSG  Indication for sleep study: Excessive Daytime Sleepiness, Fatigue, Obesity, OSA, Snoring, Witnessed Apneas  Epworth Sleepiness Score: 4  SLEEP STUDY TECHNIQUE As per the AASM Manual for the Scoring of Sleep and Associated Events v2.3 (April 2016) with a hypopnea requiring 4% desaturations.  The channels recorded and monitored were frontal, central and occipital EEG, electrooculogram (EOG), submentalis EMG (chin), nasal and oral airflow, thoracic and abdominal wall motion, anterior tibialis EMG, snore microphone, electrocardiogram, and pulse oximetry.  MEDICATIONS Medications self-administered by patient taken the night of the study : PRAVASTATIN  SLEEP ARCHITECTURE The study was initiated at 10:48:39 PM and ended at 4:56:24 AM.  Sleep onset time was 23.5 minutes and the sleep efficiency was 26.2%. The total sleep time was 96.5 minutes.  Stage REM latency was N/A minutes.  The patient spent 7.25% of the night in stage N1 sleep, 92.75% in stage N2 sleep, 0.00% in stage N3 and 0.00% in REM.  Alpha intrusion was absent.  Supine sleep was 10.88%.  RESPIRATORY PARAMETERS The overall apnea/hypopnea index (AHI) was 18.0 per hour. There were 0 total apneas, including 0 obstructive, 0 central and 0 mixed apneas. There were 29 hypopneas and 9 RERAs.  The AHI during Stage REM sleep was N/A per hour.  AHI while supine was 28.6 per hour.  The mean oxygen saturation was 92.38%. The minimum SpO2 during sleep was 87.00%.  Moderate snoring was noted during this study.  CARDIAC DATA The 2 lead EKG demonstrated sinus rhythm. The mean heart rate was  67.85 beats per minute. Other EKG findings include: None.  LEG MOVEMENT DATA The total PLMS were 0 with a resulting PLMS index of 0.00. Associated arousal with leg movement index was 0.0 .  IMPRESSIONS - Moderate obstructive sleep apnea occurred during this study (AHI = 18.0/h). - Sleep time was reduced with frequent arousals and awakenings. There was insufficient early sleep to meet protocol requirements for split CPAP titration. - No significant central sleep apnea occurred during this study (CAI = 0.0/h). - Mild oxygen desaturation was noted during this study (Min O2 = 87.00%). - The patient snored with Moderate snoring volume. - No cardiac abnormalities were noted during this study. - Clinically significant periodic limb movements did not occur during sleep. No significant associated arousals.  DIAGNOSIS - Obstructive Sleep Apnea (327.23 [G47.33 ICD-10])  RECOMMENDATIONS - Therapeutic CPAP titration to determine optimal pressure required to alleviate sleep disordered breathing. - Positional therapy avoiding supine position during sleep. - Avoid alcohol, sedatives and other CNS depressants that may worsen sleep apnea and disrupt normal sleep architecture. - Sleep hygiene should be reviewed to assess factors that may improve sleep quality. - Weight management and regular exercise should be initiated or continued if appropriate.  [Electronically signed] 08/04/2016 11:39 AM  Baird Lyons MD, ABSM Diplomate, American Board of Sleep Medicine   NPI: [id number]  [place], American Board of Sleep Medicine  ELECTRONICALLY SIGNED ON:  08/04/2016, 11:36 AM [place] PH: [phone]   FX: [phone] [place]

## 2016-08-07 ENCOUNTER — Other Ambulatory Visit: Payer: Self-pay | Admitting: Internal Medicine

## 2016-08-07 DIAGNOSIS — G4733 Obstructive sleep apnea (adult) (pediatric): Secondary | ICD-10-CM

## 2016-08-10 MED FILL — ?HYDROCHLOROTHIAZIDE 12.5MG: 12.5 | 30 days supply | Qty: 30 | Fill #2

## 2016-08-10 MED FILL — ?PANTOPRAZOLE SOD DR 40MG: 40 MG | 30 days supply | Qty: 30 | Fill #1

## 2016-08-23 ENCOUNTER — Ambulatory Visit: Payer: Self-pay | Attending: Internal Medicine

## 2016-08-24 ENCOUNTER — Ambulatory Visit (INDEPENDENT_AMBULATORY_CARE_PROVIDER_SITE_OTHER): Payer: Self-pay | Admitting: Family Medicine

## 2016-08-24 ENCOUNTER — Encounter: Payer: Self-pay | Admitting: Family Medicine

## 2016-08-24 VITALS — BP 124/64 | Ht 66.0 in | Wt 278.0 lb

## 2016-08-24 DIAGNOSIS — M7660 Achilles tendinitis, unspecified leg: Secondary | ICD-10-CM

## 2016-08-24 DIAGNOSIS — M170 Bilateral primary osteoarthritis of knee: Secondary | ICD-10-CM

## 2016-08-24 MED ORDER — METHYLPREDNISOLONE ACETATE 40 MG/ML IJ SUSP
40.0000 mg | Freq: Once | INTRAMUSCULAR | Status: AC
  Administered 2016-08-24: 40 mg via INTRA_ARTICULAR

## 2016-08-24 NOTE — Progress Notes (Signed)
[facility]: Attending Note: I have reviewed the chart, discussed wit the Sports Medicine Fellow. I agree with assessment and treatment plan as detailed in the [place] note.  We have Center to orthopedics for evaluation. Sounds like they gave her 1 round of discussed supplementation which did not seem to help at all. Unfortunately she has started to gain her weight back, and we discussed this at length today. She definitely needs to lose weight because she's had a torso total knee replacement sometime in her future in content and her current weight I doubt any surgeon would perform that procedure. We'll proceed today with bilateral corticosteroid injections but she realizes that some point is. Helping her.

## 2016-08-24 NOTE — Progress Notes (Signed)
Amanda Santiago - 51 y.o. female MRN [id number]  Date of birth: 1966/01/03  SUBJECTIVE:  Including CC & ROS.   Amanda Santiago is a 51 yo F that is presenting with bilateral knee pain and left achilles pain. Her knee pain is acute on chronic and on the medial aspect of each knee. She has receive injections previously and seem to help. Her last injection was performed in March. She also received viscosupplemenation. Takes ibuprofen for the pain. Going up stairs and walking makes the pain worse.   Left achilles pain is acute on chronic. Has had an MRI. Has tried NTG patches. Hasn't performed formal PT. 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 Her achilles feels swollen. The pain is occurring on the lateral aspect of her midbelly of her achilles.   ROS: No unexpected weight loss, fever, chills, instability, muscle pain, numbness/tingling, redness, otherwise see HPI   HISTORY: Past Medical, Surgical, Social, and Family History Reviewed & Updated per EMR.   Pertinent Historical Findings include: PMHx: morbid obesity, DM2 Surgical:   c section Social:  Former smoker  FHx: none   DATA REVIEWED: 11/07/15: Left ankle MRI: 1. Tendinopathy and potential mild partial tearing of the anterior distal portion of the Achilles tendon with mild pre Achilles bursitis. 2. Peroneus longus tendinopathy focally behind the lateral malleolus and also adjacent to the distal calcaneus. 3. Distal tibialis posterior tendinopathy with some adjacent thickening of the superomedial portion of the spring ligament -correlate clinically in assessing for tibialis posterior dysfunction. 4. Thickened medial band of the plantar fascia compatible with plantar fasciitis. 5. Mild degenerative spurring along the subtalar joint. 03/31/14: left knee x-ray: Progressive tricompartment osteoarthritis. 03/31/14: right knee x-ray: Moderate osteoarthritis.  PHYSICAL EXAM:  VS: BP 124/64   Ht 5\' 6"  (1.676 m)   Wt 278  lb (126.1 kg)   BMI 44.87 kg/m  PHYSICAL EXAM: Gen: NAD, alert, cooperative with exam, well-appearing, morbidly obese HEENT: clear conjunctiva, EOMI CV:  no edema, capillary refill brisk,  Resp: non-labored, normal speech Skin: no rashes, normal turgor  Neuro: no gross deficits.  Psych:  alert and oriented MSK:  Bilateral Knees:  Some tenderness to palpation over the medial joint line bilaterally. Normal flexion and extension. Normal strength. No pain with patellar grind compression. No obvious effusion. Negative McMurray's test. Left ankle/foot: Tenderness to palpation over the lateral aspect of the Achilles. Normal dorsal flexion and plantar flexion Has some enlargement of the Achilles to suggest a Haglund's No swelling or ecchymosis over the Achilles. No abnormal callus formation left foot. Some loss of the transverse arch. Neurovascularly intact.   Aspiration/Injection Procedure Note [name] 1966/02/03  Procedure: Injection Indications: Left knee pain   Procedure Details Consent: Risks of procedure as well as the alternatives and risks of each were explained to the (patient/caregiver).  Consent for procedure obtained. Time Out: Verified patient identification, verified procedure, site/side was marked, verified correct patient position, special equipment/implants available, medications/allergies/relevent history reviewed, required imaging and test results available.  Performed.  The area was cleaned with iodine and alcohol swabs.    The left knee was injected. 3 mL of 1% lidocaine without epinephrine was used to anesthetize the skin and the tract. An intra-articular injection was completed with 1 cc's of 40 mg Depomedrol and 4 cc's of 1% lidocaine with a 22 3" needle.  Ultrasound was used.    A sterile dressing was applied.  Patient did tolerate procedure well.   Aspiration/Injection Procedure Note  Amanda Santiago [birth year]  Procedure:  Injection Indications: right knee pain   Procedure Details Consent: Risks of procedure as well as the alternatives and risks of each were explained to the (patient/caregiver).  Consent for procedure obtained. Time Out: Verified patient identification, verified procedure, site/side was marked, verified correct patient position, special equipment/implants available, medications/allergies/relevent history reviewed, required imaging and test results available.  Performed.  The area was cleaned with iodine and alcohol swabs.    The right knee was injected using 1 cc's of 40 mg Depomedrol and 4 cc's of 1% lidocaine with a 22 3" needle.  Ultrasound was used.   A sterile dressing was applied.  Patient did tolerate procedure well   ASSESSMENT & PLAN:   Pain in Achilles tendon Has ongoing pain. Has completed NTG patches. No formal PT - referral for formal PT - heel lifts placed today   DJD (degenerative joint disease) of knee Has re-occurrence of knee pain. Encouraged the emphasis on losing weight.  - b/l injections today  - consider referral to dietician or to surgery for weight loss.  - would consider x-rays upon return but will not change management as surgery will not be indicated until her BMI is less than 40.

## 2016-08-26 DIAGNOSIS — M7660 Achilles tendinitis, unspecified leg: Secondary | ICD-10-CM | POA: Insufficient documentation

## 2016-08-26 NOTE — Assessment & Plan Note (Signed)
Has re-occurrence of knee pain. Encouraged the emphasis on losing weight.  - b/l injections today  - consider referral to dietician or to surgery for weight loss.  - would consider x-rays upon return but will not change management as surgery will not be indicated until her BMI is less than 40.

## 2016-08-26 NOTE — Assessment & Plan Note (Signed)
Has ongoing pain. Has completed NTG patches. No formal PT - referral for formal PT - heel lifts placed today

## 2016-09-03 ENCOUNTER — Encounter: Payer: Self-pay | Admitting: Physical Therapy

## 2016-09-03 ENCOUNTER — Ambulatory Visit: Payer: Self-pay | Attending: Family Medicine | Admitting: Physical Therapy

## 2016-09-03 DIAGNOSIS — R600 Localized edema: Secondary | ICD-10-CM | POA: Insufficient documentation

## 2016-09-03 DIAGNOSIS — R2689 Other abnormalities of gait and mobility: Secondary | ICD-10-CM | POA: Insufficient documentation

## 2016-09-03 DIAGNOSIS — M25572 Pain in left ankle and joints of left foot: Secondary | ICD-10-CM | POA: Insufficient documentation

## 2016-09-03 DIAGNOSIS — M25672 Stiffness of left ankle, not elsewhere classified: Secondary | ICD-10-CM | POA: Insufficient documentation

## 2016-09-03 DIAGNOSIS — M62831 Muscle spasm of calf: Secondary | ICD-10-CM | POA: Insufficient documentation

## 2016-09-03 NOTE — Therapy (Signed)
[place], [place], [address] Phone: [phone]   Fax:  [phone]  Physical Therapy Evaluation  Patient Details  Name: Amanda Santiago MRN: [id number] Date of Birth: [birth year] Referring Provider: Dickie La, MD  Encounter Date: 09/03/2016      PT End of Session - 09/03/16 1605    Visit Number 1   Number of Visits 13   Date for PT Re-Evaluation 10/15/16   PT Start Time 1500   PT Stop Time 1546   PT Time Calculation (min) 46 min   Activity Tolerance Patient tolerated treatment well   Behavior During Therapy [facility] for tasks assessed/performed      Past Medical History:  Diagnosis Date  . Arthritis   . Diabetes mellitus without complication ([place])    takes glucaphage  . 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   . Fatty liver   . Kidney stone 9-10 years ago  . Kidney stone   . Morbid obesity ([place]) 04/06/2014   BMI 50.6   . Pain in joint, ankle and foot 04/06/2014   LEFT MRI 10/1015  . Sternal pain 06/21/2014    Past Surgical History:  Procedure Laterality Date  . CESAREAN SECTION    . LITHOTRIPSY      There were no vitals filed for this visit.       Subjective Assessment - 09/03/16 1509    Subjective pt is a 51 y.o F with CC of L ankle pain that started 3 years ago that happened when she went to the park she was walking for exercise, she felt pain in the back of the heel or achilles. Saw a PT and recieved physical therapy / exercies then she rolled it after she was discharged. pain fluctuated occurs with walking and uses a heel lift in the shoe which helps, but in general pain hasn't gotten any better.  denies N/t   Limitations Standing;Walking;House hold activities   How long can you sit comfortably? unlimited   How long can you stand comfortably? 15 min   How long can you walk comfortably? 20-25 min   Diagnostic tests [place]   Patient Stated Goals to get rid of the pain, to walk, return to  exercises   Currently in Pain? Yes   Pain Score 5   6/10 at worst   Pain Location Ankle   Pain Orientation Left;Lateral   Pain Descriptors / Indicators Sharp;Throbbing   Pain Type Chronic pain   Pain Onset More than a month ago   Pain Frequency Intermittent   Aggravating Factors  walking/ standing. gettiong on the toes, squating,    Pain Relieving Factors topical ointment,             [facility] PT Assessment - 09/03/16 1502      Assessment   Medical Diagnosis L achilles tendinitis   Referring Provider Dickie La, MD   Onset Date/Surgical Date --  3 years ago   Hand Dominance Right   Next MD Visit 2 months   Prior Therapy yes     Precautions   Precautions None     Restrictions   Weight Bearing Restrictions No     Balance Screen   Has the patient fallen in the past 6 months No   Has the patient had a decrease in activity level because of a fear of falling?  No   Is the patient reluctant  to leave their home because of a fear of falling?  No     Home Social worker Private residence   Living Arrangements Children   Available Help at Discharge Available PRN/intermittently   Type of [place] to enter   Entrance Stairs-Number of Steps 5   Entrance Stairs-Rails Can reach both   Home Layout One level     Prior Function   Level of Independence Independent   Vocation Full time employment   Vocation Requirements prolonged sitting     Cognition   Overall Cognitive Status Within Functional Limits for tasks assessed     Observation/Other Assessments   Focus on Therapeutic Outcomes (FOTO)  64% limited  46% limited     Observation/Other Assessments-Edema    Edema Figure 8     Figure 8 Edema   Figure 8 - Right  61.8   Figure 8 - Left  62.5     Sensation   Light Touch Impaired by gross assessment     Posture/Postural Control    Posture/Postural Control Postural limitations     ROM / Strength   AROM / PROM / Strength AROM;PROM;Strength     AROM   AROM Assessment Site Ankle   Right/Left Ankle Right;Left   Right Ankle Dorsiflexion 3   Right Ankle Plantar Flexion 60   Right Ankle Inversion 22   Right Ankle Eversion 18   Left Ankle Dorsiflexion 0  end range pain   Left Ankle Plantar Flexion 54   Left Ankle Inversion 26   Left Ankle Eversion 12     PROM   PROM Assessment Site Ankle   Right/Left Ankle Left   Left Ankle Dorsiflexion 6     Strength   Strength Assessment Site Ankle   Right/Left Ankle Right;Left   Right Ankle Dorsiflexion 4+/5   Right Ankle Plantar Flexion 4+/5   Right Ankle Inversion 4+/5   Right Ankle Eversion 4+/5   Left Ankle Dorsiflexion 3+/5  pain during testing   Left Ankle Plantar Flexion 3/5  pain during testing   Left Ankle Inversion 3+/5  pain during testing   Left Ankle Eversion 3+/5  pain during testing     Palpation   Palpation comment TTP inferior to the lateral malleolus, significant soreness at the posterior calcaneal tubercle, Achilles tendon on L     Ambulation/Gait   Gait Pattern Step-through pattern;Decreased stride length;Lateral trunk lean to left;Antalgic;Trendelenburg;Decreased stance time - left;Decreased step length - right            Objective measurements completed on examination: See above findings.          [facility] Adult PT Treatment/Exercise - 09/03/16 1502      Manual Therapy   Manual Therapy Soft tissue mobilization   Soft tissue mobilization IASTM over the achilles tendon, and twisting over the posterior calcaneal tubercle     Ankle Exercises: Stretches   Gastroc Stretch 2 reps;30 seconds  seated                PT Education - 09/03/16 1600    Education provided Yes   Education Details evaluation findings, POC, goals, HEP with proper form/ rationale. anatomy of area involved.    Person(s) Educated  Patient;Parent(s);Child(ren)   Methods Explanation;Verbal cues;Handout;Demonstration   Comprehension Verbalized understanding;Verbal cues required;Returned demonstration          PT Short Term Goals - 09/03/16 1618      PT SHORT TERM GOAL #  1   Title pt to be I with inital HEP given (09/24/2016)   Time 3   Period Weeks   Status New     PT SHORT TERM GOAL #2   Title pt to demo technique to reduce pain and inflammation via RICE and HEP (09/24/2016)   Time 3   Period Weeks   Status New     PT SHORT TERM GOAL #3   Title reduce L calf tightness to decrease to promote ankle mobility and reduce pain to </= 4/10 with standing (09/24/2016)   Time 3   Period Weeks   Status New           PT Long Term Goals - 09/03/16 1621      PT LONG TERM GOAL #1   Title pt to improve L ankle DF to >/= 6 degrees with </= 2/10 pain for function and efficient gait pattern ( 10/15/2016)   Time 6   Period Weeks   Status New     PT LONG TERM GOAL #2   Title increase L ankle strength to >/= 4/5 with </= 2/10 pain during testing for ankle stability (10/15/2016)   Time 6   Period Weeks   Status New     PT LONG TERM GOAL #3   Title pt will be bale top walk/ stand for >/=45 min with </= 2/10 pain for functional endurance required for ADLs (10/15/2016)   Time 6   Period Weeks   Status New     PT LONG TERM GOAL #4   Title increase FOTO Score to </= 46% limited to demo improvement in function (10/15/2016)   Time 6   Period Weeks   Status New                Plan - 09/03/16 1606    Clinical Impression Statement Mrs. Juniel presents to OPPT with CC or L ankle pain that occurred 3 years ago with non-traumatic injury while walking through the park. limited L ankle mobility with DF with  compared bil. weakness with pain during testing in all planes. TTP at the posterior calcaneal tubercle and along the achilles tendon. She would benefit from physical therapy to reduce calf tightness, improve ankle  mobiity, improve gait mechanics/ efficiency and maximize her function by addressing the deficits listed.    Clinical Presentation Stable   Clinical Decision Making Low   Rehab Potential Good   PT Frequency 2x / week   PT Duration 6 weeks   PT Treatment/Interventions ADLs/Self Care Home Management;Cryotherapy;Electrical Stimulation;Iontophoresis 4mg /ml Dexamethasone;Moist Heat;Therapeutic activities;Dry needling;Therapeutic exercise;Manual techniques;Passive range of motion;Patient/family education;Ultrasound;Taping;Neuromuscular re-education   PT Next Visit Plan assess/ review HEP, soft tissue over L calf/ achilles, calf stretching, ankle intrinsics, trial ionto, modalities for pain PRn   PT Home Exercise Plan seated heel/ toe raise, seated calf stretch, towel scrunches   Consulted and Agree with Plan of Care Patient      Patient will benefit from skilled therapeutic intervention in order to improve the following deficits and impairments:  Abnormal gait, Pain, Postural dysfunction, Improper body mechanics, Increased fascial restricitons, Difficulty walking, Decreased range of motion, Decreased strength, Increased edema, Decreased endurance, Decreased activity tolerance  Visit Diagnosis: Pain in left ankle and joints of left foot - Plan: PT plan of care cert/re-cert  Other abnormalities of gait and mobility - Plan: PT plan of care cert/re-cert  Stiffness of left ankle, not elsewhere classified - Plan: PT plan of care cert/re-cert  Muscle spasm of calf -  Plan: PT plan of care cert/re-cert  Localized edema - Plan: PT plan of care cert/re-cert     Problem List Patient Active Problem List   Diagnosis Date Noted  . Pain in Achilles tendon 08/26/2016  . Achilles tendon tear, left, sequela 12/30/2015  . Diabetes mellitus without complication ([place]) 84/85/9276  . Sternal pain 06/21/2014  . Morbid obesity ([place]) 04/06/2014  . Pain in joint, ankle and foot 04/06/2014  . DJD (degenerative  joint disease) of knee 04/06/2014   Starr Lake PT, DPT, LAT, ATC  09/03/16  4:33 PM      [facility] [address] [place], [place], [address] Phone: [phone]   Fax:  [phone]  Name: Evalette Montrose MRN: [id number] Date of Birth: 07/22/1965

## 2016-09-04 ENCOUNTER — Ambulatory Visit: Payer: Self-pay | Admitting: Internal Medicine

## 2016-09-13 ENCOUNTER — Ambulatory Visit: Payer: Self-pay | Attending: Internal Medicine | Admitting: Internal Medicine

## 2016-09-13 ENCOUNTER — Encounter: Payer: Self-pay | Admitting: Internal Medicine

## 2016-09-13 VITALS — BP 140/90 | HR 78 | Temp 98.2°F | Resp 16 | Wt 302.8 lb

## 2016-09-13 DIAGNOSIS — Z833 Family history of diabetes mellitus: Secondary | ICD-10-CM | POA: Insufficient documentation

## 2016-09-13 DIAGNOSIS — Z6841 Body Mass Index (BMI) 40.0 and over, adult: Secondary | ICD-10-CM | POA: Insufficient documentation

## 2016-09-13 DIAGNOSIS — Z885 Allergy status to narcotic agent status: Secondary | ICD-10-CM | POA: Insufficient documentation

## 2016-09-13 DIAGNOSIS — Z79899 Other long term (current) drug therapy: Secondary | ICD-10-CM | POA: Insufficient documentation

## 2016-09-13 DIAGNOSIS — E559 Vitamin D deficiency, unspecified: Secondary | ICD-10-CM | POA: Insufficient documentation

## 2016-09-13 DIAGNOSIS — M170 Bilateral primary osteoarthritis of knee: Secondary | ICD-10-CM | POA: Insufficient documentation

## 2016-09-13 DIAGNOSIS — Z87891 Personal history of nicotine dependence: Secondary | ICD-10-CM | POA: Insufficient documentation

## 2016-09-13 DIAGNOSIS — Z7984 Long term (current) use of oral hypoglycemic drugs: Secondary | ICD-10-CM | POA: Insufficient documentation

## 2016-09-13 DIAGNOSIS — I10 Essential (primary) hypertension: Secondary | ICD-10-CM | POA: Insufficient documentation

## 2016-09-13 DIAGNOSIS — K219 Gastro-esophageal reflux disease without esophagitis: Secondary | ICD-10-CM | POA: Insufficient documentation

## 2016-09-13 DIAGNOSIS — Z8249 Family history of ischemic heart disease and other diseases of the circulatory system: Secondary | ICD-10-CM | POA: Insufficient documentation

## 2016-09-13 DIAGNOSIS — E119 Type 2 diabetes mellitus without complications: Secondary | ICD-10-CM | POA: Insufficient documentation

## 2016-09-13 DIAGNOSIS — Z886 Allergy status to analgesic agent status: Secondary | ICD-10-CM | POA: Insufficient documentation

## 2016-09-13 LAB — GLUCOSE, POCT (MANUAL RESULT ENTRY): POC GLUCOSE: 163 mg/dL — AB (ref 70–99)

## 2016-09-13 MED ORDER — METFORMIN HCL ER 500 MG PO TB24: 500.0000 mg | ORAL_TABLET | Freq: Every day | ORAL | 3 refills | Status: DC

## 2016-09-13 MED ORDER — PANTOPRAZOLE SODIUM 40 MG PO TBEC: 40.0000 mg | DELAYED_RELEASE_TABLET | Freq: Every day | ORAL | 2 refills | Status: DC

## 2016-09-13 MED ORDER — LISINOPRIL 5 MG PO TABS: 5.0000 mg | ORAL_TABLET | Freq: Every day | ORAL | 3 refills | Status: DC

## 2016-09-13 MED FILL — LISINOPRIL 5 MG TAB: 5 | 30 days supply | Qty: 30 | Fill #0

## 2016-09-13 MED FILL — METFORMIN HCL ER 500 MG TAB: 500 | 30 days supply | Qty: 30 | Fill #0

## 2016-09-13 MED FILL — ?PANTOPRAZOLE SOD DR 40MG: 40 MG | 30 days supply | Qty: 30 | Fill #0

## 2016-09-13 NOTE — Patient Instructions (Signed)

## 2016-09-13 NOTE — Progress Notes (Signed)
Patient ID: Amanda Santiago, female    DOB: 09-05-1965  MRN: [id number]  CC: re-establish   Subjective: Amanda Santiago is a 51 y.o. female who presents to become reestablish with me as PCP Her concerns today include:  Patient with history of diabetes2, obesity, HL,  hypertension, vitamin D deficiency, DJD of the knee (injections to both knee 08/2016 by sports medicine)  1. OA knees -doing better Post steroid injections  2. LT achilles tender rupture -still having a lot of pain Soaks foot in water with vingar  3.  DM: -out of Metformin x 3 days -does not check BS -Had changed her diet significantly the early part of this year and [place]4 [place] a [place]. She stopped doing these things and has gained 32 pounds since January -Due for diabetic eye exam. She has [place] card but this does not cover ophthalmology visit. She plans to have the [facility] exam [facility]  4. HTN: -no device to check blood pressure -She was not aware of the HCTZ prescription and was not taking  5. Vit D -completed high-dose vitamin D and now Taking 2000 IU daily  Patient Active Problem List   Diagnosis Date Noted  . Pain in Achilles tendon 08/26/2016  . Achilles tendon tear, left, sequela 12/30/2015  . Diabetes mellitus without complication ([place]) 75/88/3254  . Sternal pain 06/21/2014  . Morbid obesity ([place]) 04/06/2014  . Pain in joint, ankle and foot 04/06/2014  . DJD (degenerative joint disease) of knee 04/06/2014     Current Outpatient Prescriptions on File Prior to Visit  Medication Sig Dispense Refill  . diclofenac sodium (VOLTAREN) 1 % GEL Apply 2 g topically 4 (four) times daily. 100 g 2  . nitroGLYCERIN (NITRODUR - DOSED IN MG/24 HR) 0.2 mg/hr patch Cut patch into one - fourth pieces Place a one fourth piece of patch on  skin over affected area, changing to a new piece every 24 hours. (Patient not taking: Reported on 09/03/2016) 30 patch 1  .  pravastatin (PRAVACHOL) 20 MG tablet Take 1 tablet (20 mg total) by mouth at bedtime. 90 tablet 3  . Vitamin D, Ergocalciferol, (DRISDOL) 50000 units CAPS capsule Take 1 capsule (50,000 Units total) by mouth every 7 (seven) days. (Patient not taking: Reported on 09/13/2016) 12 capsule 0   Current Facility-Administered Medications on File Prior to Visit  Medication Dose Route Frequency Provider Last Rate Last Dose  . 0.9 %  sodium chloride infusion  500 mL Intravenous Continuous Nandigam, Venia Minks, MD        Allergies  Allergen Reactions  . Aspirin Swelling  . Morphine And Related Swelling  . Shellfish-Derived Products Swelling  . Alka-Seltzer [Aspirin Effervescent] Other (See Comments)    unknown    Social History   Social History  . Marital status: Married    Spouse name: N/A  . Number of children: N/A  . Years of education: N/A   Occupational History  . Not on file.   Social History Main Topics  . Smoking status: Former Smoker    Packs/day: 1.50    Years: 18.00    Types: Cigarettes    Quit date: 02/09/1998  . Smokeless tobacco: Former Systems developer    Quit date: 03/12/1997  . Alcohol use No     Comment: rare  . Drug use: No  . Sexual activity: Not on file   Other Topics Concern  . Not on file  Social History Narrative  . No narrative on file    Family History  Problem Relation Age of Onset  . Diabetes Mother   . Hyperlipidemia Mother   . Hypertension Mother   . Colon cancer Maternal Grandmother   . Diabetes Sister   . Hypertension Sister   . Esophageal cancer Neg Hx   . Rectal cancer Neg Hx   . Stomach cancer Neg Hx     Past Surgical History:  Procedure Laterality Date  . CESAREAN SECTION    . LITHOTRIPSY      ROS: Review of Systems As stated above PHYSICAL EXAM: BP (!) 147/90   Pulse 78   Temp 98.2 F (36.8 C) (Oral)   Resp 16   Wt (!) 302 lb 12.8 oz (137.3 kg)   SpO2 97%   BMI 48.87 kg/m   BP 140/90 Wt Readings from Last 3 Encounters:    09/13/16 (!) 302 lb 12.8 oz (137.3 kg)  08/24/16 278 lb (126.1 kg)  07/30/16 280 lb (127 kg)    Physical Exam  General appearance - alert, well appearing, moderately obese female and in no distress Mental status - alert, oriented to person, place, and time, normal mood, behavior, speech, dress, motor activity, and thought processes Neck - supple, no significant adenopathy Chest - clear to auscultation, no wheezes, rales or rhonchi, symmetric air entry Heart - normal rate, regular rhythm, normal S1, S2, no murmurs, rubs, clicks or gallops Extremities - peripheral pulses normal, no pedal edema, no clubbing or cyanosis   Results for orders placed or performed in visit on 09/13/16  POCT glucose (manual entry)  Result Value Ref Range   POC Glucose 163 (A) 70 - 99 mg/dl   Lab Results  Component Value Date   HGBA1C 5.6 05/29/2016    ASSESSMENT AND PLAN: 1. Diabetes mellitus without complication ([place]) At goal. Continue current medications. - POCT glucose (manual entry) - metFORMIN (GLUCOPHAGE-XR) 500 MG 24 hr tablet; Take 1 tablet (500 mg total) by mouth daily with breakfast.  Dispense: 90 tablet; Refill: 3 - Microalbumin / creatinine urine ratio  2. Morbid obesity ([place]) -Encourage patient to start exercising again. She plans to [place] 4 days a week as of next Monday. -Dietary counseling given. Patient set goal to cut back on eating rice and pasta. She hopes to lose at least 10 pounds by our next visit  3. Essential hypertension -Discontinue HCTZ which she has not been taking. Start lisinopril instead. Advised to limit salt in foods. - lisinopril (PRINIVIL,ZESTRIL) 5 MG tablet; Take 1 tablet (5 mg total) by mouth daily.  Dispense: 90 tablet; Refill: 3  4. Osteoarthritis of both knees, unspecified osteoarthritis type -Pain better control post steroid injections  5. Vitamin D deficiency On over-the-counter vitamin D 2000 IUs daily  6.  Gastroesophageal reflux disease without esophagitis - pantoprazole (PROTONIX) 40 MG tablet; Take 1 tablet (40 mg total) by mouth daily.  Dispense: 30 tablet; Refill: 2  Patient was given the opportunity to ask questions.  Patient verbalized understanding of the plan and was able to repeat key elements of the plan. Patient speaks [place].  Orders Placed This Encounter  Procedures  . Microalbumin / creatinine urine ratio  . POCT glucose (manual entry)     Requested Prescriptions   Signed Prescriptions Disp Refills  . metFORMIN (GLUCOPHAGE-XR) 500 MG 24 hr tablet 90 tablet 3    Sig: Take 1 tablet (500 mg total) by mouth daily with  breakfast.  . pantoprazole (PROTONIX) 40 MG tablet 30 tablet 2    Sig: Take 1 tablet (40 mg total) by mouth daily.  Marland Kitchen lisinopril (PRINIVIL,ZESTRIL) 5 MG tablet 90 tablet 3    Sig: Take 1 tablet (5 mg total) by mouth daily.    Return in about 4 months (around 01/14/2017).  Karle Plumber, MD, FACP

## 2016-09-14 LAB — MICROALBUMIN / CREATININE URINE RATIO
Creatinine, Urine: 105.3 mg/dL
MICROALB/CREAT RATIO: 13.2 mg/g{creat} (ref 0.0–30.0)
Microalbumin, Urine: 13.9 ug/mL

## 2016-09-17 ENCOUNTER — Ambulatory Visit: Payer: Self-pay | Admitting: Physical Therapy

## 2016-09-19 ENCOUNTER — Encounter: Payer: Self-pay | Admitting: Physical Therapy

## 2016-09-19 ENCOUNTER — Ambulatory Visit: Payer: Self-pay | Attending: Family Medicine | Admitting: Physical Therapy

## 2016-09-19 DIAGNOSIS — R2689 Other abnormalities of gait and mobility: Secondary | ICD-10-CM | POA: Insufficient documentation

## 2016-09-19 DIAGNOSIS — M62831 Muscle spasm of calf: Secondary | ICD-10-CM | POA: Insufficient documentation

## 2016-09-19 DIAGNOSIS — M25672 Stiffness of left ankle, not elsewhere classified: Secondary | ICD-10-CM | POA: Insufficient documentation

## 2016-09-19 DIAGNOSIS — R600 Localized edema: Secondary | ICD-10-CM | POA: Insufficient documentation

## 2016-09-19 DIAGNOSIS — M25572 Pain in left ankle and joints of left foot: Secondary | ICD-10-CM | POA: Insufficient documentation

## 2016-09-19 NOTE — Therapy (Signed)
[place], [place], [address] Phone: [phone]   Fax:  [phone]  Physical Therapy Treatment  Patient Details  Name: Amanda Santiago MRN: [id number] Date of Birth: [birth year] Referring Provider: Dickie La, MD  Encounter Date: 09/19/2016      PT End of Session - 09/19/16 1726    Visit Number 2   Number of Visits 13   Date for PT Re-Evaluation 10/15/16   PT Start Time 6606   PT Stop Time 1630   PT Time Calculation (min) 35 min   Activity Tolerance Patient tolerated treatment well   Behavior During Therapy [facility] for tasks assessed/performed      Past Medical History:  Diagnosis Date  . Arthritis   . Diabetes mellitus without complication ([place])    takes glucaphage  . 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   . Fatty liver   . Kidney stone 9-10 years ago  . Kidney stone   . Morbid obesity ([place]) 04/06/2014   BMI 50.6   . Pain in joint, ankle and foot 04/06/2014   LEFT MRI 10/1015  . Sternal pain 06/21/2014    Past Surgical History:  Procedure Laterality Date  . CESAREAN SECTION    . LITHOTRIPSY      There were no vitals filed for this visit.      Subjective Assessment - 09/19/16 1725    Subjective " still having pain in the ankle 5-6/10, I have been doing the exercise at home"    Currently in Pain? Yes   Pain Score 6    Pain Location Ankle   Pain Orientation Left   Pain Descriptors / Indicators Aching;Sore   Pain Type Chronic pain   Pain Onset More than a month ago   Pain Frequency Intermittent   Aggravating Factors  walking/ standing, towel scrunch   Pain Relieving Factors topical ointment                         [facility] Adult PT Treatment/Exercise - 09/19/16 1735      Modalities   Modalities Ultrasound;Iontophoresis     Ultrasound   Ultrasound Location med/ lateral  calcaneal region   Ultrasound Parameters 1.0 w/cm2 x 8 min @ 100%  4 min on medial aspect, 4 min on lateral aspect   Ultrasound Goals Pain     Iontophoresis   Type of Iontophoresis Dexamethasone   Location L posterior calcaneal region   Dose 1cc   Time 6 hour stat patch     Manual Therapy   Soft tissue mobilization IASTM over the achilles tendon, and twisting over the posterior calcaneal tubercle     Ankle Exercises: Stretches   Gastroc Stretch 2 reps;30 seconds     Ankle Exercises: Seated   Heel Raises 10 reps   Toe Raise 10 reps   BAPS Level 2;Sitting  forward/backward and lateraly 2 x 10 eac                PT Education - 09/19/16 1726    Education provided Yes   Education Details reviewed previously provided HEP. what iontophoresis is and benefits of treatment/ length of wear.    Person(s) Educated Patient   Methods Explanation;Verbal cues;Handout   Comprehension Verbalized understanding;Verbal cues required          PT Short Term Goals - 09/03/16 1618  PT SHORT TERM GOAL #1   Title pt to be I with inital HEP given (09/24/2016)   Time 3   Period Weeks   Status New     PT SHORT TERM GOAL #2   Title pt to demo technique to reduce pain and inflammation via RICE and HEP (09/24/2016)   Time 3   Period Weeks   Status New     PT SHORT TERM GOAL #3   Title reduce L calf tightness to decrease to promote ankle mobility and reduce pain to </= 4/10 with standing (09/24/2016)   Time 3   Period Weeks   Status New           PT Long Term Goals - 09/03/16 1621      PT LONG TERM GOAL #1   Title pt to improve L ankle DF to >/= 6 degrees with </= 2/10 pain for function and efficient gait pattern ( 10/15/2016)   Time 6   Period Weeks   Status New     PT LONG TERM GOAL #2   Title increase L ankle strength to >/= 4/5 with </= 2/10 pain during testing for ankle stability (10/15/2016)   Time 6   Period Weeks   Status New     PT LONG TERM GOAL #3   Title pt  will be bale top walk/ stand for >/=45 min with </= 2/10 pain for functional endurance required for ADLs (10/15/2016)   Time 6   Period Weeks   Status New     PT LONG TERM GOAL #4   Title increase FOTO Score to </= 46% limited to demo improvement in function (10/15/2016)   Time 6   Period Weeks   Status New               Plan - 09/19/16 1738    Clinical Impression Statement Amanda Santiago arrived 10 minutes late today. she reported continued pain in the posterior calcanela region rated at 6/10. continued stretching and IASTM techniques. trialed [place] and applied Ionto post session. pt reported decreased pain with walking/ standing.    PT Next Visit Plan assess response to US/ Ionto, soft tissue over L calf/ achilles, calf stretching, ankle intrinsics, trial ionto, modalities for pain PRn   PT Home Exercise Plan seated heel/ toe raise, seated calf stretch, towel scrunches   Consulted and Agree with Plan of Care Patient      Patient will benefit from skilled therapeutic intervention in order to improve the following deficits and impairments:  Abnormal gait, Pain, Postural dysfunction, Improper body mechanics, Increased fascial restricitons, Difficulty walking, Decreased range of motion, Decreased strength, Increased edema, Decreased endurance, Decreased activity tolerance  Visit Diagnosis: Pain in left ankle and joints of left foot  Other abnormalities of gait and mobility  Muscle spasm of calf  Stiffness of left ankle, not elsewhere classified  Localized edema     Problem List Patient Active Problem List   Diagnosis Date Noted  . Pain in Achilles tendon 08/26/2016  . Achilles tendon tear, left, sequela 12/30/2015  . Diabetes mellitus without complication ([place]) 14/97/0263  . Sternal pain 06/21/2014  . Morbid obesity ([place]) 04/06/2014  . Pain in joint, ankle and foot 04/06/2014  . DJD (degenerative joint disease) of knee 04/06/2014   Starr Lake PT, DPT, LAT, ATC   09/19/16  5:42 PM      [place] [facility] [address] [place], [place], [address] Phone: [phone]   Fax:  [phone]  Name: Amanda Santiago MRN: [id number] Date of Birth: [birth year]

## 2016-09-24 ENCOUNTER — Encounter: Payer: Self-pay | Admitting: Physical Therapy

## 2016-09-24 ENCOUNTER — Ambulatory Visit: Payer: Self-pay | Admitting: Physical Therapy

## 2016-09-24 DIAGNOSIS — R600 Localized edema: Secondary | ICD-10-CM

## 2016-09-24 DIAGNOSIS — R2689 Other abnormalities of gait and mobility: Secondary | ICD-10-CM

## 2016-09-24 DIAGNOSIS — M25572 Pain in left ankle and joints of left foot: Secondary | ICD-10-CM

## 2016-09-24 DIAGNOSIS — M62831 Muscle spasm of calf: Secondary | ICD-10-CM

## 2016-09-24 DIAGNOSIS — M25672 Stiffness of left ankle, not elsewhere classified: Secondary | ICD-10-CM

## 2016-09-24 NOTE — Therapy (Signed)
[place], [place], [address] Phone: [phone]   Fax:  [phone]  Physical Therapy Treatment  Patient Details  Name: Amanda Santiago MRN: [id number] Date of Birth: 11-17-1965 Referring Provider: Dickie La, MD  Encounter Date: 09/24/2016      PT End of Session - 09/24/16 1718    Visit Number 3   Number of Visits 13   Date for PT Re-Evaluation 10/15/16   PT Start Time 1634   PT Stop Time 0300   PT Time Calculation (min) 41 min   Activity Tolerance Patient tolerated treatment well   Behavior During Therapy [facility] for tasks assessed/performed      Past Medical History:  Diagnosis Date  . Arthritis   . Diabetes mellitus without complication ([place])    takes glucaphage  . 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   . Fatty liver   . Kidney stone 9-10 years ago  . Kidney stone   . Morbid obesity ([place]) 04/06/2014   BMI 50.6   . Pain in joint, ankle and foot 04/06/2014   LEFT MRI 10/1015  . Sternal pain 06/21/2014    Past Surgical History:  Procedure Laterality Date  . CESAREAN SECTION    . LITHOTRIPSY      There were no vitals filed for this visit.      Subjective Assessment - 09/24/16 1635    Subjective "I am still having pain, but it is alittle less"   Pain Score 3    Pain Location Ankle   Pain Descriptors / Indicators Sore   Pain Type Chronic pain   Pain Onset More than a month ago   Pain Frequency Intermittent                         [facility] Adult PT Treatment/Exercise - 09/24/16 1715      Ultrasound   Ultrasound Location Lateral ankle   Ultrasound Parameters .5 w/cm2 @ 100% x 8 min   Ultrasound Goals Pain     Iontophoresis   Type of Iontophoresis Dexamethasone   Location L posterior calcaneal region   Dose 1cc   Time 6 hour stat patch     Manual Therapy   Manual Therapy  Joint mobilization   Manual therapy comments manual trigger point release over Gastroc/ soleus   Soft tissue mobilization IASTM over the achilles tendon, and peroneal tendons and twisting over the posterior calcaneal tubercle     Ankle Exercises: Stretches   Gastroc Stretch 3 reps;30 seconds     Ankle Exercises: Seated   Heel Raises 10 reps   Toe Raise 10 reps   BAPS Level 2;Sitting                  PT Short Term Goals - 09/03/16 1618      PT SHORT TERM GOAL #1   Title pt to be I with inital HEP given (09/24/2016)   Time 3   Period Weeks   Status New     PT SHORT TERM GOAL #2   Title pt to demo technique to reduce pain and inflammation via RICE and HEP (09/24/2016)   Time 3   Period Weeks   Status New     PT SHORT TERM GOAL #3   Title reduce L calf tightness to decrease to promote ankle mobility and reduce pain to </= 4/10 with  standing (09/24/2016)   Time 3   Period Weeks   Status New           PT Long Term Goals - 09/03/16 1621      PT LONG TERM GOAL #1   Title pt to improve L ankle DF to >/= 6 degrees with </= 2/10 pain for function and efficient gait pattern ( 10/15/2016)   Time 6   Period Weeks   Status New     PT LONG TERM GOAL #2   Title increase L ankle strength to >/= 4/5 with </= 2/10 pain during testing for ankle stability (10/15/2016)   Time 6   Period Weeks   Status New     PT LONG TERM GOAL #3   Title pt will be bale top walk/ stand for >/=45 min with </= 2/10 pain for functional endurance required for ADLs (10/15/2016)   Time 6   Period Weeks   Status New     PT LONG TERM GOAL #4   Title increase FOTO Score to </= 46% limited to demo improvement in function (10/15/2016)   Time 6   Period Weeks   Status New               Plan - 09/24/16 1719    Clinical Impression Statement pt reports improvement in pain at 2-3/10. continued focus on manual to relived tension of calf tightness to relieve achille tension. continues Baps to promote  ankle stability and control. she reported decreased pain post session. continued Ionto for inflammation.    PT Next Visit Plan assess response to US/ Ionto, soft tissue over L calf/ achilles, calf stretching, ankle intrinsics, trial ionto, modalities for pain PRn   PT Home Exercise Plan seated heel/ toe raise, seated calf stretch, towel scrunches   Consulted and Agree with Plan of Care Patient      Patient will benefit from skilled therapeutic intervention in order to improve the following deficits and impairments:     Visit Diagnosis: Pain in left ankle and joints of left foot  Other abnormalities of gait and mobility  Stiffness of left ankle, not elsewhere classified  Muscle spasm of calf  Localized edema     Problem List Patient Active Problem List   Diagnosis Date Noted  . Pain in Achilles tendon 08/26/2016  . Achilles tendon tear, left, sequela 12/30/2015  . Diabetes mellitus without complication ([place]) 24/11/7351  . Sternal pain 06/21/2014  . Morbid obesity ([place]) 04/06/2014  . Pain in joint, ankle and foot 04/06/2014  . DJD (degenerative joint disease) of knee 04/06/2014   Starr [place] PT, DPT, LAT, ATC  09/24/16  5:20 PM      [facility] [address] [place], [place], [address] Phone: [phone]   Fax:  [phone]  Name: Amanda Santiago MRN: [id number] Date of Birth: 03/02/1966

## 2016-09-26 ENCOUNTER — Ambulatory Visit: Payer: Self-pay | Admitting: Physical Therapy

## 2016-09-26 ENCOUNTER — Encounter: Payer: Self-pay | Admitting: Physical Therapy

## 2016-09-26 DIAGNOSIS — M62831 Muscle spasm of calf: Secondary | ICD-10-CM

## 2016-09-26 DIAGNOSIS — M25672 Stiffness of left ankle, not elsewhere classified: Secondary | ICD-10-CM

## 2016-09-26 DIAGNOSIS — M25572 Pain in left ankle and joints of left foot: Secondary | ICD-10-CM

## 2016-09-26 DIAGNOSIS — R600 Localized edema: Secondary | ICD-10-CM

## 2016-09-26 DIAGNOSIS — R2689 Other abnormalities of gait and mobility: Secondary | ICD-10-CM

## 2016-09-26 NOTE — Therapy (Signed)
[place], [place], [address] Phone: [phone]   Fax:  [phone]  Physical Therapy Treatment  Patient Details  Name: Amanda Santiago MRN: [id number] Date of Birth: Feb 08, 1966 Referring Provider: Dickie La, MD  Encounter Date: 09/26/2016      PT End of Session - 09/26/16 1738    Visit Number 4   Number of Visits 13   Date for PT Re-Evaluation 10/15/16   PT Start Time 1634   PT Stop Time 1719   PT Time Calculation (min) 45 min   Behavior During Therapy [facility] for tasks assessed/performed      Past Medical History:  Diagnosis Date  . Arthritis   . Diabetes mellitus without complication ([place])    takes glucaphage  . 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   . Fatty liver   . Kidney stone 9-10 years ago  . Kidney stone   . Morbid obesity ([place]) 04/06/2014   BMI 50.6   . Pain in joint, ankle and foot 04/06/2014   LEFT MRI 10/1015  . Sternal pain 06/21/2014    Past Surgical History:  Procedure Laterality Date  . CESAREAN SECTION    . LITHOTRIPSY      There were no vitals filed for this visit.      Subjective Assessment - 09/26/16 1636    Subjective Hit her foot on chair and pain is a little more,..  It had been helping today it is a 5/10. She has been doing the exercises.  3-4 x a day Wearing flip flops. pain moving from central posterior heel achilles to more lateralIt has been hurting all morning.    Patient is accompained by: Family member;Interpreter   Currently in Pain? Yes   Pain Score 5    Pain Location Ankle   Pain Orientation Left;Posterior;Lateral   Pain Descriptors / Indicators Sore;Throbbing   Pain Type Chronic pain   Pain Frequency Constant   Aggravating Factors  weightbearing.  Feels like it is walking over glass,   heat ,  hot oil.     Pain Relieving Factors PT. rest,  uses electric chair  at grocery store.   Effect of Pain on Daily Activities wakes her    Multiple Pain Sites --  left knee hurts more than right knee. mild to moderate                         [facility] Adult PT Treatment/Exercise - 09/26/16 0001      Self-Care   Self-Care --  avoid bare foot wear supportive shoes.      Ultrasound   Ultrasound Location achilles , heel   Ultrasound Parameters 1 watt/cm2, 100% 8 minutes   Ultrasound Goals Pain     Iontophoresis   Type of Iontophoresis Dexamethasone   Location L posterior calcaneal region   Dose 1cc, 4 mg/ml   Time 6 hour stat patch     Manual Therapy   Manual Therapy Joint mobilization   Manual therapy comments heel distraction with gentle movements supination/pronation .     Joint Mobilization andle mobs with movement with strap for DF  A/P fibula glides entirevbone length     Ankle Exercises: Stretches   Other Stretch pro stretch     Ankle Exercises: Seated   Towel Crunch 5 reps   Heel Raises 5 reps   Toe Raise 5  reps                PT Education - 09/26/16 1736    Education provided Yes   Education Details pain control,  need to wear shoes that are supportive   Person(s) Educated Patient   Methods Explanation   Comprehension Verbalized understanding          PT Short Term Goals - 09/03/16 1618      PT SHORT TERM GOAL #1   Title pt to be I with inital HEP given (09/24/2016)   Time 3   Period Weeks   Status New     PT SHORT TERM GOAL #2   Title pt to demo technique to reduce pain and inflammation via RICE and HEP (09/24/2016)   Time 3   Period Weeks   Status New     PT SHORT TERM GOAL #3   Title reduce L calf tightness to decrease to promote ankle mobility and reduce pain to </= 4/10 with standing (09/24/2016)   Time 3   Period Weeks   Status New           PT Long Term Goals - 09/03/16 1621      PT LONG TERM GOAL #1   Title pt to improve L ankle DF to >/= 6 degrees with </= 2/10 pain for  function and efficient gait pattern ( 10/15/2016)   Time 6   Period Weeks   Status New     PT LONG TERM GOAL #2   Title increase L ankle strength to >/= 4/5 with </= 2/10 pain during testing for ankle stability (10/15/2016)   Time 6   Period Weeks   Status New     PT LONG TERM GOAL #3   Title pt will be bale top walk/ stand for >/=45 min with </= 2/10 pain for functional endurance required for ADLs (10/15/2016)   Time 6   Period Weeks   Status New     PT LONG TERM GOAL #4   Title increase FOTO Score to </= 46% limited to demo improvement in function (10/15/2016)   Time 6   Period Weeks   Status New               Plan - 09/26/16 1738    Clinical Impression Statement Pain had improved until she caught foot on a chair (lightly) and she has been having pain all morning.  Heel was still sore at end of session.  Mid foot stretches with strap stretch and posterior foot is rigid. 4 degreed  DF PROM   PT Treatment/Interventions ADLs/Self Care Home Management;Cryotherapy;Electrical Stimulation;Iontophoresis 4mg /ml Dexamethasone;Moist Heat;Therapeutic activities;Dry needling;Therapeutic exercise;Manual techniques;Passive range of motion;Patient/family education;Ultrasound;Taping;Neuromuscular re-education   PT Next Visit Plan assess response to US/ Ionto, soft tissue over L calf/ achilles, calf stretching, ankle intrinsics, trial ionto, modalities for pain PRn   PT Home Exercise Plan seated heel/ toe raise, seated calf stretch, towel scrunches   Consulted and Agree with Plan of Care Patient;Family member/caregiver   Family Member Consulted Mother      Patient will benefit from skilled therapeutic intervention in order to improve the following deficits and impairments:     Visit Diagnosis: Pain in left ankle and joints of left foot  Other abnormalities of gait and mobility  Stiffness of left ankle, not elsewhere classified  Muscle spasm of calf  Localized edema     Problem  List Patient Active Problem List   Diagnosis Date Noted  .  Pain in Achilles tendon 08/26/2016  . Achilles tendon tear, left, sequela 12/30/2015  . Diabetes mellitus without complication ([place]) 29/93/7169  . Sternal pain 06/21/2014  . Morbid obesity ([place]) 04/06/2014  . Pain in joint, ankle and foot 04/06/2014  . DJD (degenerative joint disease) of knee 04/06/2014    Draken Farrior  PTA 09/26/2016, 5:43 PM  [facility] [address] [place], [place], [address] Phone: [phone]   Fax:  [phone]  Name: Emira Eubanks MRN: [id number] Date of Birth: [birth year]

## 2016-09-26 NOTE — Patient Instructions (Signed)
Avoid pain with exercise

## 2016-10-03 ENCOUNTER — Encounter: Payer: Self-pay | Admitting: Physical Therapy

## 2016-10-03 ENCOUNTER — Ambulatory Visit: Payer: Self-pay | Admitting: Physical Therapy

## 2016-10-03 DIAGNOSIS — M62831 Muscle spasm of calf: Secondary | ICD-10-CM

## 2016-10-03 DIAGNOSIS — R600 Localized edema: Secondary | ICD-10-CM

## 2016-10-03 DIAGNOSIS — M25572 Pain in left ankle and joints of left foot: Secondary | ICD-10-CM

## 2016-10-03 DIAGNOSIS — R2689 Other abnormalities of gait and mobility: Secondary | ICD-10-CM

## 2016-10-03 DIAGNOSIS — M25672 Stiffness of left ankle, not elsewhere classified: Secondary | ICD-10-CM

## 2016-10-03 NOTE — Therapy (Signed)
[place], [place], [address] Phone: [phone]   Fax:  [phone]  Physical Therapy Treatment  Patient Details  Name: Amanda Santiago MRN: [id number] Date of Birth: 1966-03-09 Referring Provider: Dickie La, MD  Encounter Date: 10/03/2016      PT End of Session - 10/03/16 1722    Visit Number 5   Number of Visits 13   Date for PT Re-Evaluation 10/15/16   PT Start Time 1630   PT Stop Time 1727   PT Time Calculation (min) 57 min   Activity Tolerance Patient tolerated treatment well   Behavior During Therapy [facility] for tasks assessed/performed      Past Medical History:  Diagnosis Date  . Arthritis   . Diabetes mellitus without complication ([place])    takes glucaphage  . 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   . Fatty liver   . Kidney stone 9-10 years ago  . Kidney stone   . Morbid obesity ([place]) 04/06/2014   BMI 50.6   . Pain in joint, ankle and foot 04/06/2014   LEFT MRI 10/1015  . Sternal pain 06/21/2014    Past Surgical History:  Procedure Laterality Date  . CESAREAN SECTION    . LITHOTRIPSY      There were no vitals filed for this visit.      Subjective Assessment - 10/03/16 1628    Subjective I am not well but I am doing much better.  i was sore the day after the last treatment then it started to improve.  last 3 days no pain. Able to go to 3 different stores without the electric chair. Son has been massaging her feet too.     Patient is accompained by: Family member;Interpreter   Currently in Pain? Yes   Pain Score 2    Pain Frequency Intermittent   Aggravating Factors  longer walking   Pain Relieving Factors PT   Multiple Pain Sites No            [facility] PT Assessment - 10/03/16 0001      PROM   PROM Assessment Site Ankle   Right/Left Ankle Left   Left Ankle Dorsiflexion 6                      [facility] Adult PT Treatment/Exercise - 10/03/16 0001      Cryotherapy   Number Minutes Cryotherapy 15 Minutes   Cryotherapy Location Ankle   Type of Cryotherapy --  cold pack     Ultrasound   Ultrasound Location achilles, heel   Ultrasound Parameters 1 watt/cm2 , 100 % 8 minutes   Ultrasound Goals Pain     Iontophoresis   Type of Iontophoresis Dexamethasone   Location L posterior achilles region   Dose 1cc, 4 mg/ml   Time 6 hour stat patch     Manual Therapy   Manual Therapy Joint mobilization   Manual therapy comments heel distraction with gentle movements supination/pronation .     Joint Mobilization ankle mobs with movement for df   A/P fibula glides entirevbone length   Soft tissue mobilization calf left     Ankle Exercises: Supine   Other Supine Ankle Exercises DF 10 X 5 seconds   Other Supine Ankle Exercises toe curls 10 X 5 seconds,  scoop and dump sand 10 X 5 seconds.  Mild pain with the dumping in  achilles                  PT Short Term Goals - 10/03/16 1724      PT SHORT TERM GOAL #1   Title pt to be I with inital HEP given (09/24/2016)   Baseline independent with exercises issued so far   Time 3   Period Weeks   Status On-going     PT SHORT TERM GOAL #2   Title pt to demo technique to reduce pain and inflammation via RICE and HEP (09/24/2016)   Baseline tries to do at home   Time 3   Period Weeks   Status On-going     PT SHORT TERM GOAL #3   Title reduce L calf tightness to decrease to promote ankle mobility and reduce pain to </= 4/10 with standing (09/24/2016)   Baseline 2/10 today,  consistant?   Time 3   Period Weeks   Status Partially Met           PT Long Term Goals - 09/03/16 1621      PT LONG TERM GOAL #1   Title pt to improve L ankle DF to >/= 6 degrees with </= 2/10 pain for function and efficient gait pattern ( 10/15/2016)   Time 6   Period Weeks   Status New     PT LONG TERM GOAL #2   Title  increase L ankle strength to >/= 4/5 with </= 2/10 pain during testing for ankle stability (10/15/2016)   Time 6   Period Weeks   Status New     PT LONG TERM GOAL #3   Title pt will be bale top walk/ stand for >/=45 min with </= 2/10 pain for functional endurance required for ADLs (10/15/2016)   Time 6   Period Weeks   Status New     PT LONG TERM GOAL #4   Title increase FOTO Score to </= 46% limited to demo improvement in function (10/15/2016)   Time 6   Period Weeks   Status New               Plan - 10/03/16 1722    Clinical Impression Statement Pain 2/10 today.  Last session helpful so it was repeated.(  Manual,  [place] and Ionto)  DF PROM 6 degrees   PT Treatment/Interventions ADLs/Self Care Home Management;Cryotherapy;Electrical Stimulation;Iontophoresis 30m/ml Dexamethasone;Moist Heat;Therapeutic activities;Dry needling;Therapeutic exercise;Manual techniques;Passive range of motion;Patient/family education;Ultrasound;Taping;Neuromuscular re-education   PT Next Visit Plan continue  US/ Ionto, soft tissue over L calf/ achilles, calf stretching, ankle intrinsics, , modalities for pain PRn,  progress HEP as able   PT Home Exercise Plan seated heel/ toe raise, seated calf stretch, towel scrunches   Consulted and Agree with Plan of Care Patient;Family member/caregiver   Family Member Consulted Mother      Patient will benefit from skilled therapeutic intervention in order to improve the following deficits and impairments:  Abnormal gait, Pain, Postural dysfunction, Improper body mechanics, Increased fascial restricitons, Difficulty walking, Decreased range of motion, Decreased strength, Increased edema, Decreased endurance, Decreased activity tolerance  Visit Diagnosis: Pain in left ankle and joints of left foot  Other abnormalities of gait and mobility  Stiffness of left ankle, not elsewhere classified  Muscle spasm of calf  Localized edema     Problem List Patient Active  Problem List   Diagnosis Date Noted  . Pain in Achilles tendon 08/26/2016  . Achilles tendon tear, left, sequela 12/30/2015  . Diabetes mellitus  without complication ([place]) 43/27/6147  . Sternal pain 06/21/2014  . Morbid obesity ([place]) 04/06/2014  . Pain in joint, ankle and foot 04/06/2014  . DJD (degenerative joint disease) of knee 04/06/2014    HARRIS,KAREN PTA 10/03/2016, 5:28 PM  [facility] [address] [place], [place], [address] Phone: [phone]   Fax:  [phone]  Name: Amanda Santiago MRN: [id number] Date of Birth: Jul 13, 1965

## 2016-10-05 ENCOUNTER — Ambulatory Visit (INDEPENDENT_AMBULATORY_CARE_PROVIDER_SITE_OTHER): Payer: Self-pay | Admitting: Orthopedic Surgery

## 2016-10-05 ENCOUNTER — Encounter (INDEPENDENT_AMBULATORY_CARE_PROVIDER_SITE_OTHER): Payer: Self-pay | Admitting: Orthopedic Surgery

## 2016-10-05 DIAGNOSIS — M170 Bilateral primary osteoarthritis of knee: Secondary | ICD-10-CM

## 2016-10-05 NOTE — Progress Notes (Signed)
Office Visit Note   Patient: Amanda Santiago           Date of Birth: 16-May-1965           MRN: [id number] Visit Date: 10/05/2016 Requested by: Maren Reamer, MD No address on file PCP: Ladell Pier, MD  Subjective: Chief Complaint  Patient presents with  . Right Knee - Follow-up  . Left Knee - Follow-up    HPI: Amanda Santiago is a 51 year old patient with bilateral knee pain.  She had bilateral monitor this injection is 07/06/2016.  She had cortisone injections in June with some relief at [facility] medicine center.  She is using some type of ointment/cream for her knees.  She is no longer requires using the electric chair in stores.  She also was doing some type of physical therapy.              ROS: All systems reviewed are negative as they relate to the chief complaint within the history of present illness.  Patient denies  fevers or chills.   Assessment & Plan: Visit Diagnoses:  1. Primary osteoarthritis of both knees     Plan: Impression is endstage knee arthritis.  Plan is continue with regimen of episodic gel injections every 6 months and cortisone injections every 6 months.  This would be for injections per year alternating cortisone and gel.  I'll see her back in October for gel injections.  Alternatively this could be done at the sports [place] whatever is most convenient for her.  Follow-Up Instructions: No Follow-up on file.   Orders:  No orders of the defined types were placed in this encounter.  No orders of the defined types were placed in this encounter.     Procedures: No procedures performed   Clinical Data: No additional findings.  Objective: Vital Signs: There were no vitals taken for this visit.  Physical Exam:   Constitutional: Patient appears well-developed HEENT:  Head: Normocephalic Eyes:EOM are normal Neck: Normal range of motion Cardiovascular: Normal rate Pulmonary/chest: Effort normal Neurologic: Patient is  alert Skin: Skin is warm Psychiatric: Patient has normal mood and affect    Ortho Exam: Orthopedic exam demonstrates increased body mass index but normal gait alignment no groin pain with internal/external rotation of either leg.  Pedal pulses palpable.  No effusion in the knee but there is a fair amount of course grinding and crepitus more on the right than the left with passive and active knee flexion.  Specialty Comments:  No specialty comments available.  Imaging: No results found.   PMFS History: Patient Active Problem List   Diagnosis Date Noted  . Pain in Achilles tendon 08/26/2016  . Achilles tendon tear, left, sequela 12/30/2015  . Diabetes mellitus without complication ([place]) 16/09/3708  . Sternal pain 06/21/2014  . Morbid obesity ([place]) 04/06/2014  . Pain in joint, ankle and foot 04/06/2014  . DJD (degenerative joint disease) of knee 04/06/2014   Past Medical History:  Diagnosis Date  . Arthritis   . Diabetes mellitus without complication ([place])    takes glucaphage  . 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   . Fatty liver   . Kidney stone 9-10 years ago  . Kidney stone   . Morbid obesity ([place]) 04/06/2014   BMI 50.6   . Pain in joint, ankle and foot 04/06/2014   LEFT MRI 10/1015  . Sternal pain 06/21/2014    Family  History  Problem Relation Age of Onset  . Diabetes Mother   . Hyperlipidemia Mother   . Hypertension Mother   . Colon cancer Maternal Grandmother   . Diabetes Sister   . Hypertension Sister   . Esophageal cancer Neg Hx   . Rectal cancer Neg Hx   . Stomach cancer Neg Hx     Past Surgical History:  Procedure Laterality Date  . CESAREAN SECTION    . LITHOTRIPSY     Social History   Occupational History  . Not on file.   Social History Main Topics  . Smoking status: Former Smoker    Packs/day: 1.50    Years: 18.00    Types: Cigarettes    Quit date:  02/09/1998  . Smokeless tobacco: Former Systems developer    Quit date: 03/12/1997  . Alcohol use No     Comment: rare  . Drug use: No  . Sexual activity: Not on file

## 2016-10-08 ENCOUNTER — Ambulatory Visit: Payer: Self-pay | Admitting: Physical Therapy

## 2016-10-08 ENCOUNTER — Encounter: Payer: Self-pay | Admitting: Physical Therapy

## 2016-10-08 DIAGNOSIS — M62831 Muscle spasm of calf: Secondary | ICD-10-CM

## 2016-10-08 DIAGNOSIS — M25672 Stiffness of left ankle, not elsewhere classified: Secondary | ICD-10-CM

## 2016-10-08 DIAGNOSIS — R2689 Other abnormalities of gait and mobility: Secondary | ICD-10-CM

## 2016-10-08 DIAGNOSIS — R600 Localized edema: Secondary | ICD-10-CM

## 2016-10-08 DIAGNOSIS — M25572 Pain in left ankle and joints of left foot: Secondary | ICD-10-CM

## 2016-10-08 NOTE — Therapy (Signed)
[facility] [address] [place], [place], [address] Phone: [phone]   Fax:  [phone]  Physical Therapy Treatment  Patient Details  Name: Amanda Santiago MRN: [id number] Date of Birth: 03/12/1965 Referring Provider: Neal, Sara L, MD  Encounter Date: 10/08/2016      PT End of Session - 10/08/16 1635    Visit Number 6   Number of Visits 13   Date for PT Re-Evaluation 10/15/16   PT Start Time 1546   PT Stop Time 1631   PT Time Calculation (min) 45 min   Activity Tolerance Patient tolerated treatment well   Behavior During Therapy WFL for tasks assessed/performed      Past Medical History:  Diagnosis Date  . Arthritis   . Diabetes mellitus without complication (HCC)    takes glucaphage  . 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   . Fatty liver   . Kidney stone 9-10 years ago  . Kidney stone   . Morbid obesity (HCC) 04/06/2014   BMI 50.6   . Pain in joint, ankle and foot 04/06/2014   LEFT MRI 10/1015  . Sternal pain 06/21/2014    Past Surgical History:  Procedure Laterality Date  . CESAREAN SECTION    . LITHOTRIPSY      There were no vitals filed for this visit.      Subjective Assessment - 10/08/16 1550    Subjective "my pain is much better, I feel a little pull when I lift my foot off the floor"    Currently in Pain? Yes   Pain Score 2    Pain Orientation Left   Pain Descriptors / Indicators Sore   Pain Type Chronic pain   Pain Onset More than a month ago   Pain Frequency Intermittent                         [facility] Adult PT Treatment/Exercise - 10/08/16 1632      Ultrasound   Ultrasound Location Achilles/ Lateral gastroc   Ultrasound Parameters 1 w/cm2 1.0 itensity x 6 min, 1 w/cm2 @ .4 intensity for achilles tension   Ultrasound Goals Pain     Iontophoresis   Type of Iontophoresis  Dexamethasone   Location L posterior achilles region   Dose 1cc, 4 mg/ml   Time 6 hour stat patch     Manual Therapy   Manual Therapy Soft tissue mobilization   Joint Mobilization ankle mobs with movement for df,   Soft tissue mobilization IASTM over the calf and active release techniques     Ankle Exercises: Stretches   Gastroc Stretch 3 reps;30 seconds     Ankle Exercises: Seated   Heel Raises 10 reps  bil 2 sets, 2 sets up with both down with LLE only   BAPS Level 2;Sitting                  PT Short Term Goals - 10/03/16 1724      PT SHORT TERM GOAL #1   Title pt to be I with inital HEP given (09/24/2016)   Baseline independent with exercises issued so far   Time 3   Period Weeks   Status On-going     PT SHORT TERM GOAL #2   Title pt to demo technique to reduce pain and inflammation via RICE and HEP (09/24/2016)   Baseline tries to do at   home   Time 3   Period Weeks   Status On-going     PT SHORT TERM GOAL #3   Title reduce L calf tightness to decrease to promote ankle mobility and reduce pain to </= 4/10 with standing (09/24/2016)   Baseline 2/10 today,  consistant?   Time 3   Period Weeks   Status Partially Met           PT Long Term Goals - 09/03/16 1621      PT LONG TERM GOAL #1   Title pt to improve L ankle DF to >/= 6 degrees with </= 2/10 pain for function and efficient gait pattern ( 10/15/2016)   Time 6   Period Weeks   Status New     PT LONG TERM GOAL #2   Title increase L ankle strength to >/= 4/5 with </= 2/10 pain during testing for ankle stability (10/15/2016)   Time 6   Period Weeks   Status New     PT LONG TERM GOAL #3   Title pt will be bale top walk/ stand for >/=45 min with </= 2/10 pain for functional endurance required for ADLs (10/15/2016)   Time 6   Period Weeks   Status New     PT LONG TERM GOAL #4   Title increase FOTO Score to </= 46% limited to demo improvement in function (10/15/2016)   Time 6   Period Weeks    Status New               Plan - 10/08/16 1635    Clinical Impression Statement pt arrived reporting 2/10 pain today. contiued manual trigger point release over the gastroc/ solues and US over the gastroc and achillles. conitnued standing ankle strengtheing progressing to controlled single leg lowering which she required multiple cues for form but she performed it well. continued Ionto post session which she reported no pain post session. plan to continue for 1-2 more visits if she is doing well plan to D/C   PT Next Visit Plan continue  US/ Ionto, soft tissue over L calf/ achilles, calf stretching, ankle intrinsics, , modalities for pain PRn,  progress HEP as able   Consulted and Agree with Plan of Care Patient;Family member/caregiver      Patient will benefit from skilled therapeutic intervention in order to improve the following deficits and impairments:  Decreased endurance  Visit Diagnosis: Pain in left ankle and joints of left foot  Other abnormalities of gait and mobility  Stiffness of left ankle, not elsewhere classified  Muscle spasm of calf  Localized edema     Problem List Patient Active Problem List   Diagnosis Date Noted  . Pain in Achilles tendon 08/26/2016  . Achilles tendon tear, left, sequela 12/30/2015  . Diabetes mellitus without complication (HCC) 06/21/2014  . Sternal pain 06/21/2014  . Morbid obesity (HCC) 04/06/2014  . Pain in joint, ankle and foot 04/06/2014  . DJD (degenerative joint disease) of knee 04/06/2014   Kristoffer Leamon PT, DPT, LAT, ATC  10/08/16  4:38 PM      [facility] [address] [place], [place], [address] Phone: [phone]   Fax:  [phone]  Name: Amanda Santiago MRN: [id number] Date of Birth: 10/17/1965   

## 2016-10-10 ENCOUNTER — Ambulatory Visit: Payer: Self-pay | Admitting: Physical Therapy

## 2016-10-15 ENCOUNTER — Ambulatory Visit: Payer: Self-pay | Admitting: Physical Therapy

## 2016-10-17 ENCOUNTER — Encounter: Payer: Self-pay | Admitting: Physical Therapy

## 2016-10-22 ENCOUNTER — Ambulatory Visit: Payer: Self-pay

## 2016-10-29 ENCOUNTER — Encounter: Payer: Self-pay | Admitting: Physical Therapy

## 2016-10-29 ENCOUNTER — Ambulatory Visit: Payer: Self-pay | Attending: Family Medicine | Admitting: Physical Therapy

## 2016-10-29 DIAGNOSIS — M62831 Muscle spasm of calf: Secondary | ICD-10-CM | POA: Insufficient documentation

## 2016-10-29 DIAGNOSIS — R2689 Other abnormalities of gait and mobility: Secondary | ICD-10-CM | POA: Insufficient documentation

## 2016-10-29 DIAGNOSIS — M25572 Pain in left ankle and joints of left foot: Secondary | ICD-10-CM | POA: Insufficient documentation

## 2016-10-29 DIAGNOSIS — M25672 Stiffness of left ankle, not elsewhere classified: Secondary | ICD-10-CM | POA: Insufficient documentation

## 2016-10-29 DIAGNOSIS — R600 Localized edema: Secondary | ICD-10-CM | POA: Insufficient documentation

## 2016-10-29 NOTE — Therapy (Signed)
[facility] [place], [place], [address] Phone: [phone]   Fax:  [phone]  Physical Therapy Treatment / Re-certification  Patient Details  Name: Amanda Santiago MRN: [id number] Date of Birth: 02-17-1966 Referring Provider: Dickie La, MD  Encounter Date: 10/29/2016      PT End of Session - 10/29/16 0938    Visit Number 7   Number of Visits 13   Date for PT Re-Evaluation 12/10/16   PT Start Time 0846   PT Stop Time 0935   PT Time Calculation (min) 49 min   Activity Tolerance Patient tolerated treatment well   Behavior During Therapy [facility] for tasks assessed/performed      Past Medical History:  Diagnosis Date  . Arthritis   . Diabetes mellitus without complication ([place])    takes glucaphage  . 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   . Fatty liver   . Kidney stone 9-10 years ago  . Kidney stone   . Morbid obesity ([place]) 04/06/2014   BMI 50.6   . Pain in joint, ankle and foot 04/06/2014   LEFT MRI 10/1015  . Sternal pain 06/21/2014    Past Surgical History:  Procedure Laterality Date  . CESAREAN SECTION    . LITHOTRIPSY      There were no vitals filed for this visit.      Subjective Assessment - 10/29/16 0849    Subjective "I missed the last session due to a death in the family, the last session i was doing pretty good but the last exercise made my ankle sore"    Currently in Pain? Yes   Pain Score 3    Pain Location Ankle   Pain Orientation Left   Pain Descriptors / Indicators Sore;Aching   Pain Type Chronic pain   Pain Onset More than a month ago   Pain Frequency Intermittent   Aggravating Factors  going up and down stairs   Pain Relieving Factors rest            [facility] PT Assessment - 10/29/16 0900      AROM   Left Ankle Dorsiflexion 2   Left Ankle Plantar Flexion 60   Left Ankle Inversion 26    Left Ankle Eversion 18     Strength   Left Ankle Dorsiflexion 4+/5   Left Ankle Plantar Flexion 4+/5   Left Ankle Inversion 4+/5   Left Ankle Eversion 4+/5                     [facility] Adult PT Treatment/Exercise - 10/29/16 0945      Therapeutic Activites    Therapeutic Activities Other Therapeutic Activities   Other Therapeutic Activities stair training with ascending/ descending reciprocally  5 x 4 inch steps, 5 x 6 inch steps. pt required verbal cues and mulitple demonstration to avoid leaning back and bend the weight bearing knee to decrease amount contralateral limb PF     Knee/Hip Exercises: Seated   Long Arc Quad Both;2 sets;10 reps     Iontophoresis   Type of Iontophoresis Dexamethasone   Location L posterior achilles region   Dose 1cc, 4 mg/ml   Time 6 hour stat patch     Manual Therapy   Soft tissue mobilization IASTM over the calf and active release techniques     Ankle Exercises: Stretches   Gastroc Stretch 3 reps;30 seconds  cues to  avoid pain and only feel a stretch                PT Education - 10/29/16 0935    Education provided Yes   Education Details importance of stretching avoiding pain focusing on  stretch only, and proper technique with stair training   Person(s) Educated Patient  with interpreter   Methods Explanation;Verbal cues;Demonstration;Tactile cues   Comprehension Verbalized understanding;Verbal cues required;Tactile cues required;Need further instruction;Returned demonstration          PT Short Term Goals - 10/29/16 [phone]      PT SHORT TERM GOAL #1   Title pt to be I with inital HEP given (09/24/2016)   Time 3   Period Weeks   Status Achieved     PT SHORT TERM GOAL #2   Title pt to demo technique to reduce pain and inflammation via RICE and HEP (09/24/2016)   Time 3   Period Weeks   Status Achieved     PT SHORT TERM GOAL #3   Title reduce L calf tightness to decrease to promote ankle mobility and reduce pain  to </= 4/10 with standing (09/24/2016)   Time 3   Period Weeks   Status Achieved           PT Long Term Goals - 10/29/16 2536      PT LONG TERM GOAL #1   Title pt to improve L ankle DF to >/= 6 degrees with </= 2/10 pain for function and efficient gait pattern ( 10/15/2016)   Time 6   Period Weeks   Status Partially Met     PT LONG TERM GOAL #2   Title increase L ankle strength to >/= 4/5 with </= 2/10 pain during testing for ankle stability (10/15/2016)   Time 6   Period Weeks   Status Partially Met     PT LONG TERM GOAL #3   Title pt will be bale top walk/ stand for >/=45 min with </= 2/10 pain for functional endurance required for ADLs (10/15/2016)   Time 6   Period Weeks   Status Partially Met     PT LONG TERM GOAL #4   Title increase FOTO Score to </= 46% limited to demo improvement in function (10/15/2016)   Time 6   Period Weeks   Status Unable to assess               Plan - 10/29/16 0939    Clinical Impression Statement pt reports she has had increased pain in the heel reported as 3/10 since the last session with the last exercise provided. She states her biggest issue is going down stairs. practiced stair training which she required multiple verbal/ visual cues including demonstration on technique to avoid leaning back, and bend her knees to decrease effect of PF of the foot/ ankle. continued to work on soft tissue work and reviewed HEP as well as importance of stretching to avoid pain. updated HEP with quad strength.    Rehab Potential Good   PT Frequency 2x / week   PT Duration 3 weeks   PT Treatment/Interventions ADLs/Self Care Home Management;Cryotherapy;Electrical Stimulation;Iontophoresis 20m/ml Dexamethasone;Moist Heat;Therapeutic activities;Dry needling;Therapeutic exercise;Manual techniques;Passive range of motion;Patient/family education;Ultrasound;Taping;Neuromuscular re-education   PT Next Visit Plan FOTO, continue  US/ Ionto, soft tissue over L calf/  achilles, calf stretching, foot intrinsics, knee/ hip strengthening for proximal control , modalities for pain PRn,   PT Home Exercise Plan seated heel/ toe raise, seated calf stretch, towel  scrunches, LAQ (with focus on eccentric lowering)   Consulted and Agree with Plan of Care Patient;Family member/caregiver   Family Member Consulted Mother      Patient will benefit from skilled therapeutic intervention in order to improve the following deficits and impairments:  Decreased endurance, Abnormal gait, Pain, Improper body mechanics, Postural dysfunction, Decreased range of motion, Increased fascial restricitons, Decreased activity tolerance, Decreased balance, Decreased strength  Visit Diagnosis: Pain in left ankle and joints of left foot - Plan: PT plan of care cert/re-cert  Other abnormalities of gait and mobility - Plan: PT plan of care cert/re-cert  Stiffness of left ankle, not elsewhere classified - Plan: PT plan of care cert/re-cert  Muscle spasm of calf - Plan: PT plan of care cert/re-cert  Localized edema - Plan: PT plan of care cert/re-cert     Problem List Patient Active Problem List   Diagnosis Date Noted  . Pain in Achilles tendon 08/26/2016  . Achilles tendon tear, left, sequela 12/30/2015  . Diabetes mellitus without complication ([place]) 03/49/1791  . Sternal pain 06/21/2014  . Morbid obesity ([place]) 04/06/2014  . Pain in joint, ankle and foot 04/06/2014  . DJD (degenerative joint disease) of knee 04/06/2014   Starr Lake PT, DPT, LAT, ATC  10/29/16  9:51 AM      [facility] [address] [place], [place], [address] Phone: [phone]   Fax:  [phone]  Name: Carmelle Bamberg MRN: [id number] Date of Birth: 1965-07-23

## 2016-10-30 MED FILL — ?PANTOPRAZOLE SOD DR 40MG: 40 MG | 30 days supply | Qty: 30 | Fill #1

## 2016-10-30 MED FILL — METFORMIN HCL ER 500 MG TAB: 500 | 30 days supply | Qty: 30 | Fill #1

## 2016-11-06 ENCOUNTER — Encounter: Payer: Self-pay | Admitting: Physical Therapy

## 2016-11-06 ENCOUNTER — Ambulatory Visit: Payer: Self-pay | Admitting: Physical Therapy

## 2016-11-06 DIAGNOSIS — M25672 Stiffness of left ankle, not elsewhere classified: Secondary | ICD-10-CM

## 2016-11-06 DIAGNOSIS — R600 Localized edema: Secondary | ICD-10-CM

## 2016-11-06 DIAGNOSIS — M25572 Pain in left ankle and joints of left foot: Secondary | ICD-10-CM

## 2016-11-06 DIAGNOSIS — M62831 Muscle spasm of calf: Secondary | ICD-10-CM

## 2016-11-06 NOTE — Therapy (Signed)
[place], [place], [address] Phone: [phone]   Fax:  [phone]  Physical Therapy Treatment  Patient Details  Name: Amanda Santiago MRN: [id number] Date of Birth: Feb 01, 1966 Referring Provider: Dickie La, MD  Encounter Date: 11/06/2016      PT End of Session - 11/06/16 1836    Visit Number 8   Number of Visits 13   Date for PT Re-Evaluation 12/10/16   PT Start Time 0933   PT Stop Time 1026   PT Time Calculation (min) 53 min   Behavior During Therapy [facility] for tasks assessed/performed      Past Medical History:  Diagnosis Date  . Arthritis   . Diabetes mellitus without complication ([place])    takes glucaphage  . 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   . Fatty liver   . Kidney stone 9-10 years ago  . Kidney stone   . Morbid obesity ([place]) 04/06/2014   BMI 50.6   . Pain in joint, ankle and foot 04/06/2014   LEFT MRI 10/1015  . Sternal pain 06/21/2014    Past Surgical History:  Procedure Laterality Date  . CESAREAN SECTION    . LITHOTRIPSY      There were no vitals filed for this visit.      Subjective Assessment - 11/06/16 0938    Subjective Hip pin improved to just a little.  Able to walk now.  Ankle pain not waking her at night.    Patient is accompained by: Family member;Interpreter   Currently in Pain? Yes   Pain Orientation Left   Pain Descriptors / Indicators Aching   Pain Frequency Intermittent   Aggravating Factors  steps , up  dowm  4/5   Pain Relieving Factors rest   Effect of Pain on Daily Activities no,  I do things with pain   Multiple Pain Sites --  Back 7/10 pain,  walking worse, moving around the wrong way,  improves with foot pain helps back,   rest                         [facility] Adult PT Treatment/Exercise - 11/06/16 0001      Knee/Hip Exercises: Supine   Short Arc Quad Sets 10 reps   Short Arc Quad Sets Limitations with manual resistyance into flexion     Knee/Hip Exercises: Sidelying   Hip ABduction 10 reps;Both   Hip ABduction Limitations heavy cues for positioning initially     Cryotherapy   Number Minutes Cryotherapy 10 Minutes   Cryotherapy Location Ankle   Type of Cryotherapy --  cold pack     Manual Therapy   Soft tissue mobilization IASTM over the calf and active release techniques  foot mobs,  fibula     Ankle Exercises: Stretches   Gastroc Stretch 3 reps;30 seconds     Ankle Exercises: Seated   Other Seated Ankle Exercises 3 way 10 X manual resistance ankle     Ankle Exercises: Supine   Other Supine Ankle Exercises 3 way manual resistance 10 x each                  PT Short Term Goals - 10/29/16 0086      PT SHORT TERM GOAL #1   Title pt to be I with inital HEP given (09/24/2016)   Time 3   Period  Weeks   Status Achieved     PT SHORT TERM GOAL #2   Title pt to demo technique to reduce pain and inflammation via RICE and HEP (09/24/2016)   Time 3   Period Weeks   Status Achieved     PT SHORT TERM GOAL #3   Title reduce L calf tightness to decrease to promote ankle mobility and reduce pain to </= 4/10 with standing (09/24/2016)   Time 3   Period Weeks   Status Achieved           PT Long Term Goals - 11/06/16 1842      PT LONG TERM GOAL #1   Title pt to improve L ankle DF to >/= 6 degrees with </= 2/10 pain for function and efficient gait pattern ( 10/15/2016)   Time 6   Period Weeks     PT LONG TERM GOAL #2   Time 6   Period Weeks   Status Unable to assess     PT LONG TERM GOAL #3   Title pt will be bale top walk/ stand for >/=45 min with </= 2/10 pain for functional endurance required for ADLs (10/15/2016)   Time 6   Period Weeks     PT LONG TERM GOAL #4   Title increase FOTO Score to </= 46% limited to demo improvement in function (10/15/2016)   Baseline 55% limited6   Period Weeks    Status On-going               Plan - 11/06/16 1837    Clinical Impression Statement Patient feels she has had a great deal of improvement with PT.  Pain is  3/10 today. FOTO today = 55% limitation.( improved) Manual and strengthening are the focus.  Patient does not have insoles which may help.  Dexamethasone held today due to having 6 treatments .   PT Next Visit Plan  continue  US/ Ionto, soft tissue over L calf/ achilles, calf stretching, foot intrinsics, knee/ hip strengthening for proximal control , modalities for pain PRn,   PT Home Exercise Plan seated heel/ toe raise, seated calf stretch, towel scrunches, LAQ (with focus on eccentric lowering)   Consulted and Agree with Plan of Care Patient   Family Member Consulted Mother      Patient will benefit from skilled therapeutic intervention in order to improve the following deficits and impairments:     Visit Diagnosis: Pain in left ankle and joints of left foot  Stiffness of left ankle, not elsewhere classified  Muscle spasm of calf  Localized edema     Problem List Patient Active Problem List   Diagnosis Date Noted  . Pain in Achilles tendon 08/26/2016  . Achilles tendon tear, left, sequela 12/30/2015  . Diabetes mellitus without complication ([place]) 64/40/3474  . Sternal pain 06/21/2014  . Morbid obesity ([place]) 04/06/2014  . Pain in joint, ankle and foot 04/06/2014  . DJD (degenerative joint disease) of knee 04/06/2014    HARRIS,KAREN PTA 11/06/2016, 6:45 PM  [facility] [address] [place], [place], [address] Phone: [phone]   Fax:  [phone]  Name: Amanda Santiago MRN: [id number] Date of Birth: 05-Jan-1966

## 2016-11-13 ENCOUNTER — Ambulatory Visit: Payer: Self-pay | Attending: Family Medicine | Admitting: Physical Therapy

## 2016-11-13 ENCOUNTER — Encounter: Payer: Self-pay | Admitting: Physical Therapy

## 2016-11-13 DIAGNOSIS — R600 Localized edema: Secondary | ICD-10-CM | POA: Insufficient documentation

## 2016-11-13 DIAGNOSIS — R2689 Other abnormalities of gait and mobility: Secondary | ICD-10-CM | POA: Insufficient documentation

## 2016-11-13 DIAGNOSIS — M25572 Pain in left ankle and joints of left foot: Secondary | ICD-10-CM | POA: Insufficient documentation

## 2016-11-13 DIAGNOSIS — M62831 Muscle spasm of calf: Secondary | ICD-10-CM | POA: Insufficient documentation

## 2016-11-13 DIAGNOSIS — M25672 Stiffness of left ankle, not elsewhere classified: Secondary | ICD-10-CM | POA: Insufficient documentation

## 2016-11-13 NOTE — Therapy (Addendum)
[place], [place], [address] Phone: [phone]   Fax:  [phone]  Physical Therapy Treatment / Discharge summary  Patient Details  Name: Medrith Veillon MRN: [id number] Date of Birth: [birth year] Referring Provider: Dickie La, MD  Encounter Date: 11/13/2016      PT End of Session - 11/13/16 1159    Visit Number 9   Number of Visits 13   Date for PT Re-Evaluation 12/10/16   PT Start Time 1102   PT Stop Time 1144   PT Time Calculation (min) 42 min   Activity Tolerance Patient tolerated treatment well   Behavior During Therapy [facility] for tasks assessed/performed      Past Medical History:  Diagnosis Date  . Arthritis   . Diabetes mellitus without complication ([place])    takes glucaphage  . 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   . Fatty liver   . Kidney stone 9-10 years ago  . Kidney stone   . Morbid obesity ([place]) 04/06/2014   BMI 50.6   . Pain in joint, ankle and foot 04/06/2014   LEFT MRI 10/1015  . Sternal pain 06/21/2014    Past Surgical History:  Procedure Laterality Date  . CESAREAN SECTION    . LITHOTRIPSY      There were no vitals filed for this visit.      Subjective Assessment - 11/13/16 1110    Subjective "I went to DC, only pain at 2-3/10"    Currently in Pain? Yes   Pain Score 3    Aggravating Factors  prolonged walking                         [facility] Adult PT Treatment/Exercise - 11/13/16 1120      Knee/Hip Exercises: Seated   Long Arc Quad 2 sets;10 reps;Both   Abduction/Adduction  --     Knee/Hip Exercises: Supine   Bridges with Clamshell 2 sets;10 reps   Bridges with Clamshell --  with green theraband   Other Supine Knee/Hip Exercises clams 2 x 10  red theraband, alternating hip abduction 2 x 10 with green theraband     Ankle Exercises: Seated   [place] Pickup  4 x starting at time of 1:30 sec, and decreasing 10 sec every rep     Ankle Exercises: Supine   Other Supine Ankle Exercises 3 way manual resistance 10 x each     Ankle Exercises: Stretches   Gastroc Stretch 2 reps;30 seconds                PT Education - 11/13/16 1157    Education provided Yes   Education Details benefits of strengthening the hip/ knee to promote stability and control for the ankle/ knee.    Person(s) Educated Patient   Methods Explanation;Verbal cues   Comprehension Verbalized understanding;Verbal cues required          PT Short Term Goals - 10/29/16 0943      PT SHORT TERM GOAL #1   Title pt to be I with inital HEP given (09/24/2016)   Time 3   Period Weeks   Status Achieved     PT SHORT TERM GOAL #2   Title pt to demo technique to reduce pain and inflammation via RICE and HEP (09/24/2016)   Time 3   Period Weeks   Status Achieved  PT SHORT TERM GOAL #3   Title reduce L calf tightness to decrease to promote ankle mobility and reduce pain to </= 4/10 with standing (09/24/2016)   Time 3   Period Weeks   Status Achieved           PT Long Term Goals - 11/06/16 1842      PT LONG TERM GOAL #1   Title pt to improve L ankle DF to >/= 6 degrees with </= 2/10 pain for function and efficient gait pattern ( 10/15/2016)   Time 6   Period Weeks     PT LONG TERM GOAL #2   Time 6   Period Weeks   Status Unable to assess     PT LONG TERM GOAL #3   Title pt will be bale top walk/ stand for >/=45 min with </= 2/10 pain for functional endurance required for ADLs (10/15/2016)   Time 6   Period Weeks     PT LONG TERM GOAL #4   Title increase FOTO Score to </= 46% limited to demo improvement in function (10/15/2016)   Baseline 55% limited6   Period Weeks   Status On-going               Plan - 11/13/16 1200    Clinical Impression Statement pt reports she was able to amb around DC with no more than 2-3/10 pain, and reports only 2-3/10 pain  today. continued working on proximal support with foot intrinsics, and working up the kinetic chain. she reported feeling better post session.    PT Next Visit Plan , soft tissue over L calf/ achilles, calf stretching, foot intrinsics, knee/ hip strengthening for proximal control , modalities for pain PRn,   PT Home Exercise Plan seated heel/ toe raise, seated calf stretch, towel scrunches, LAQ (with focus on eccentric lowering)   Consulted and Agree with Plan of Care Patient   Family Member Consulted Mother      Patient will benefit from skilled therapeutic intervention in order to improve the following deficits and impairments:  Decreased endurance, Abnormal gait, Pain, Improper body mechanics, Postural dysfunction, Decreased range of motion, Increased fascial restricitons, Decreased activity tolerance, Decreased balance, Decreased strength  Visit Diagnosis: Pain in left ankle and joints of left foot  Stiffness of left ankle, not elsewhere classified  Muscle spasm of calf  Localized edema  Other abnormalities of gait and mobility     Problem List Patient Active Problem List   Diagnosis Date Noted  . Pain in Achilles tendon 08/26/2016  . Achilles tendon tear, left, sequela 12/30/2015  . Diabetes mellitus without complication ([place]) 42/68/3419  . Sternal pain 06/21/2014  . Morbid obesity ([place]) 04/06/2014  . Pain in joint, ankle and foot 04/06/2014  . DJD (degenerative joint disease) of knee 04/06/2014   Starr [place] PT, DPT, LAT, ATC  11/13/16  12:38 PM      [facility] [address] [place], [place], [address] Phone: [phone]   Fax:  [phone]  Name: Zehava Turski MRN: [id number] Date of Birth: [birth year]    PHYSICAL THERAPY DISCHARGE SUMMARY  Visits from Start of Care: 9  Current functional level related to goals / functional outcomes: See goals   Remaining deficits: Unknown due to  continued pain   Education / Equipment: HEP  Plan: Patient agrees to discharge.  Patient goals were partially met. Patient is being discharged due to not returning since the last visit.  ?????  Chayil Gantt PT, DPT, LAT, ATC  12/05/16  9:57 AM

## 2016-11-14 ENCOUNTER — Encounter: Payer: Self-pay | Admitting: Physical Therapy

## 2016-11-15 ENCOUNTER — Ambulatory Visit: Payer: Self-pay | Admitting: Physical Therapy

## 2016-11-19 ENCOUNTER — Ambulatory Visit: Payer: Self-pay | Admitting: Physical Therapy

## 2016-11-21 ENCOUNTER — Ambulatory Visit: Payer: Self-pay | Admitting: Physical Therapy

## 2016-11-26 ENCOUNTER — Ambulatory Visit: Payer: Self-pay | Admitting: Physical Therapy

## 2016-12-12 MED FILL — LISINOPRIL 5 MG TAB: 5 | 30 days supply | Qty: 30 | Fill #1

## 2016-12-12 MED FILL — METFORMIN HCL ER 500 MG TAB: 500 | 30 days supply | Qty: 30 | Fill #2

## 2016-12-12 MED FILL — ?PANTOPRAZOLE SOD DR 40MG: 40 MG | 30 days supply | Qty: 30 | Fill #2

## 2016-12-12 MED FILL — ?HYDROCHLOROTHIAZIDE 12.5MG: 12.5 | 30 days supply | Qty: 30 | Fill #3

## 2016-12-17 ENCOUNTER — Encounter (HOSPITAL_COMMUNITY): Payer: Self-pay

## 2016-12-21 ENCOUNTER — Encounter (INDEPENDENT_AMBULATORY_CARE_PROVIDER_SITE_OTHER): Payer: Self-pay | Admitting: Orthopedic Surgery

## 2016-12-21 ENCOUNTER — Ambulatory Visit (INDEPENDENT_AMBULATORY_CARE_PROVIDER_SITE_OTHER): Payer: Self-pay | Admitting: Orthopedic Surgery

## 2016-12-21 DIAGNOSIS — M1711 Unilateral primary osteoarthritis, right knee: Secondary | ICD-10-CM

## 2016-12-21 DIAGNOSIS — M1712 Unilateral primary osteoarthritis, left knee: Secondary | ICD-10-CM

## 2016-12-21 MED ORDER — LIDOCAINE HCL 1 % IJ SOLN
5.0000 mL | INTRAMUSCULAR | Status: AC | PRN
  Administered 2016-12-21: 5 mL

## 2016-12-21 MED ORDER — HYALURONAN 88 MG/4ML IX SOSY
88.0000 mg | PREFILLED_SYRINGE | INTRA_ARTICULAR | Status: AC | PRN
  Administered 2016-12-21: 88 mg via INTRA_ARTICULAR

## 2016-12-21 NOTE — Progress Notes (Signed)
   Procedure Note  Patient: Amanda Santiago             Date of Birth: Nov 19, 1965           MRN: [id number]             Visit Date: 12/21/2016  Procedures: Visit Diagnoses: Unilateral primary osteoarthritis, left knee  Unilateral primary osteoarthritis, right knee  Large Joint Inj Date/Time: 12/21/2016 9:47 AM Performed by: Meredith Pel Authorized by: Meredith Pel   Consent Given by:  Patient Site marked: the procedure site was marked   Timeout: prior to procedure the correct patient, procedure, and site was verified   Indications:  Pain, joint swelling and diagnostic evaluation Location:  Knee Site:  R knee Prep: patient was prepped and draped in usual sterile fashion   Needle Size:  18 G Needle Length:  1.5 inches Approach:  Superolateral Ultrasound Guidance: No   Fluoroscopic Guidance: No   Arthrogram: No   Medications:  5 mL lidocaine 1 %; 88 mg Hyaluronan 88 MG/4ML Patient tolerance:  Patient tolerated the procedure well with no immediate complications Large Joint Inj Date/Time: 12/21/2016 9:47 AM Performed by: Meredith Pel Authorized by: Meredith Pel   Consent Given by:  Patient Site marked: the procedure site was marked   Timeout: prior to procedure the correct patient, procedure, and site was verified   Indications:  Pain, joint swelling and diagnostic evaluation Location:  Knee Site:  L knee Prep: patient was prepped and draped in usual sterile fashion   Needle Size:  18 G Needle Length:  1.5 inches Approach:  Superolateral Ultrasound Guidance: No   Fluoroscopic Guidance: No   Arthrogram: No   Medications:  5 mL lidocaine 1 %; 88 mg Hyaluronan 88 MG/4ML Patient tolerance:  Patient tolerated the procedure well with no immediate complications

## 2017-02-07 MED FILL — METFORMIN HCL ER 500 MG TAB: 500 | 30 days supply | Qty: 30 | Fill #3

## 2017-04-16 MED FILL — METFORMIN HCL ER 500 MG TAB: 500 | 30 days supply | Qty: 30 | Fill #4

## 2017-04-29 ENCOUNTER — Encounter (HOSPITAL_COMMUNITY): Payer: Self-pay

## 2017-04-29 DIAGNOSIS — R1011 Right upper quadrant pain: Secondary | ICD-10-CM | POA: Insufficient documentation

## 2017-04-29 DIAGNOSIS — Z87442 Personal history of urinary calculi: Secondary | ICD-10-CM | POA: Insufficient documentation

## 2017-04-29 DIAGNOSIS — E119 Type 2 diabetes mellitus without complications: Secondary | ICD-10-CM | POA: Insufficient documentation

## 2017-04-29 LAB — URINALYSIS, ROUTINE W REFLEX MICROSCOPIC
Bilirubin Urine: NEGATIVE
Glucose, UA: NEGATIVE mg/dL
KETONES UR: NEGATIVE mg/dL
Leukocytes, UA: NEGATIVE
Nitrite: NEGATIVE
PROTEIN: NEGATIVE mg/dL
RBC / HPF: NONE SEEN RBC/hpf (ref 0–5)
Specific Gravity, Urine: 1.016 (ref 1.005–1.030)
pH: 7 (ref 5.0–8.0)

## 2017-04-29 MED ORDER — ACETAMINOPHEN 325 MG PO TABS
650.0000 mg | ORAL_TABLET | Freq: Once | ORAL | Status: AC
  Administered 2017-04-29: 650 mg via ORAL
  Filled 2017-04-29: qty 2

## 2017-04-29 NOTE — ED Triage Notes (Signed)
Pt states that R sided flank pain started yesterday, worse today with some nausea, denies urinary symptoms. Reports hx of gallstones

## 2017-04-30 ENCOUNTER — Emergency Department (HOSPITAL_COMMUNITY)
Admission: EM | Admit: 2017-04-30 | Discharge: 2017-04-30 | Disposition: A | Discharge status: Home / Self Care | Payer: Self-pay | Attending: Emergency Medicine | Admitting: Emergency Medicine

## 2017-04-30 ENCOUNTER — Emergency Department (HOSPITAL_COMMUNITY): Payer: Self-pay

## 2017-04-30 ENCOUNTER — Other Ambulatory Visit: Payer: Self-pay

## 2017-04-30 DIAGNOSIS — R1011 Right upper quadrant pain: Secondary | ICD-10-CM

## 2017-04-30 LAB — COMPREHENSIVE METABOLIC PANEL
ALT: 25 U/L (ref 14–54)
AST: 30 U/L (ref 15–41)
Albumin: 3.5 g/dL (ref 3.5–5.0)
Alkaline Phosphatase: 107 U/L (ref 38–126)
Anion gap: 11 (ref 5–15)
BUN: 14 mg/dL (ref 6–20)
CHLORIDE: 102 mmol/L (ref 101–111)
CO2: 24 mmol/L (ref 22–32)
Calcium: 8.8 mg/dL — ABNORMAL LOW (ref 8.9–10.3)
Creatinine, Ser: 0.56 mg/dL (ref 0.44–1.00)
Glucose, Bld: 140 mg/dL — ABNORMAL HIGH (ref 65–99)
POTASSIUM: 3.6 mmol/L (ref 3.5–5.1)
Sodium: 137 mmol/L (ref 135–145)
Total Bilirubin: 0.7 mg/dL (ref 0.3–1.2)
Total Protein: 8.2 g/dL — ABNORMAL HIGH (ref 6.5–8.1)

## 2017-04-30 LAB — CBC WITH DIFFERENTIAL/PLATELET
BASOS ABS: 0 10*3/uL (ref 0.0–0.1)
Basophils Relative: 0 %
Eosinophils Absolute: 0.1 10*3/uL (ref 0.0–0.7)
Eosinophils Relative: 1 %
HEMATOCRIT: 43.5 % (ref 36.0–46.0)
HEMOGLOBIN: 14.4 g/dL (ref 12.0–15.0)
LYMPHS PCT: 41 %
Lymphs Abs: 4.5 10*3/uL — ABNORMAL HIGH (ref 0.7–4.0)
MCH: 30 pg (ref 26.0–34.0)
MCHC: 33.1 g/dL (ref 30.0–36.0)
MCV: 90.6 fL (ref 78.0–100.0)
Monocytes Absolute: 0.5 10*3/uL (ref 0.1–1.0)
Monocytes Relative: 5 %
NEUTROS PCT: 53 %
Neutro Abs: 5.7 10*3/uL (ref 1.7–7.7)
Platelets: 302 10*3/uL (ref 150–400)
RBC: 4.8 MIL/uL (ref 3.87–5.11)
RDW: 14.7 % (ref 11.5–15.5)
WBC: 10.9 10*3/uL — AB (ref 4.0–10.5)

## 2017-04-30 LAB — PREGNANCY, URINE: Preg Test, Ur: NEGATIVE

## 2017-04-30 LAB — LIPASE, BLOOD: LIPASE: 29 U/L (ref 11–51)

## 2017-04-30 MED ORDER — POLYETHYLENE GLYCOL 3350 17 GM/SCOOP PO POWD: 17.0000 g | Freq: Every day | ORAL | 0 refills | Status: DC

## 2017-04-30 MED ORDER — DICYCLOMINE HCL 10 MG/ML IM SOLN
20.0000 mg | Freq: Once | INTRAMUSCULAR | Status: AC
  Administered 2017-04-30: 20 mg via INTRAMUSCULAR
  Filled 2017-04-30: qty 2

## 2017-04-30 MED ORDER — DICYCLOMINE HCL 20 MG PO TABS: 20.0000 mg | ORAL_TABLET | Freq: Two times a day (BID) | ORAL | 0 refills | Status: DC | PRN

## 2017-04-30 NOTE — Discharge Instructions (Signed)
Your workup in the emergency department today was generally reassuring.  We advise follow-up with a primary care doctor; however, you may benefit from specialist follow-up given your history of similar symptoms.  We advised the use of Tylenol for pain.  You may also take Bentyl as prescribed for abdominal pain/cramping.  Use MiraLAX as you are CT did suggest mild constipation.  You may return to the emergency department for new or concerning symptoms.  ----  Amanda Santiago en el departamento de emergencias de hoy fue generalmente tranquilizador. Aconsejamos el seguimiento con un mdico de atencin primaria; sin embargo, puede beneficiarse de un seguimiento especializado dado su historial de sntomas similares. Aconsejamos el uso de Tylenol para Conservation officer, historic buildings. Tambin puede tomar Bentyl segn lo prescrito para Conservation officer, historic buildings / clicos abdominales. Use MiraLAX ya que la TC sugiere un estreimiento leve. Puede regresar al departamento de emergencias por sntomas nuevos o relacionados.

## 2017-04-30 NOTE — ED Provider Notes (Signed)
[facility] EMERGENCY DEPARTMENT Provider Note   CSN: [id number] Arrival date & time: 04/29/17  1928     History   Chief Complaint Chief Complaint  Patient presents with  . Flank Pain    HPI Amanda Santiago is a 52 y.o. female.   52 year old female with a history of kidney stones, diabetes mellitus, obesity presents to the emergency department for complaints of abdominal pain.  She reports a history of similar pain over the years.  Pain has been constant over the past 2 days and aching.  It is nonradiating.  She has had associated nausea and anorexia.  She denies any worsening pain with eating.  No medications taken prior to arrival for symptoms.  She had a normal bowel movement earlier yesterday.  She denies any dysuria, hematuria, melena, hematochezia, vomiting, chest pain, shortness of breath.  Abdominal surgical history significant for cesarean section.  Son notes complaints of a headache on arrival, but this resolved with Tylenol given in triage.      Past Medical History:  Diagnosis Date  . Arthritis   . Diabetes mellitus without complication ([place])    takes glucaphage  . 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   . Fatty liver   . Kidney stone 9-10 years ago  . Kidney stone   . Morbid obesity ([place]) 04/06/2014   BMI 50.6   . Pain in joint, ankle and foot 04/06/2014   LEFT MRI 10/1015  . Sternal pain 06/21/2014    Patient Active Problem List   Diagnosis Date Noted  . Pain in Achilles tendon 08/26/2016  . Achilles tendon tear, left, sequela 12/30/2015  . Diabetes mellitus without complication ([place]) 12/45/8099  . Sternal pain 06/21/2014  . Morbid obesity ([place]) 04/06/2014  . Pain in joint, ankle and foot 04/06/2014  . DJD (degenerative joint disease) of knee 04/06/2014    Past Surgical History:  Procedure Laterality Date  . CESAREAN SECTION    . LITHOTRIPSY        OB History    Gravida Para Term Preterm AB Living   7 2 2   5 2    SAB TAB Ectopic Multiple Live Births   5               Home Medications    Prior to Admission medications   Medication Sig Start Date End Date Taking? Authorizing Provider  diclofenac sodium (VOLTAREN) 1 % GEL Apply 2 g topically 4 (four) times daily. 04/04/16   Maren Reamer, MD  dicyclomine (BENTYL) 20 MG tablet Take 1 tablet (20 mg total) by mouth every 12 (twelve) hours as needed (for abdominal pain/cramping). 04/30/17   Antonietta Breach, PA-C  lisinopril (PRINIVIL,ZESTRIL) 5 MG tablet Take 1 tablet (5 mg total) by mouth daily. 09/13/16   Ladell Pier, MD  metFORMIN (GLUCOPHAGE-XR) 500 MG 24 hr tablet Take 1 tablet (500 mg total) by mouth daily with breakfast. 09/13/16   Ladell Pier, MD  nitroGLYCERIN (NITRODUR - DOSED IN MG/24 HR) 0.2 mg/hr patch Cut patch into one - fourth pieces Place a one fourth piece of patch on  skin over affected area, changing to a new piece every 24 hours. 10/21/15   Dickie La, MD  pantoprazole (PROTONIX) 40 MG tablet Take 1 tablet (40 mg total) by mouth daily. 09/13/16   Ladell Pier, MD  polyethylene glycol powder (GLYCOLAX/MIRALAX) powder Take 17 g by mouth  daily. Until daily soft stools  OTC 04/30/17   Antonietta Breach, PA-C  pravastatin (PRAVACHOL) 20 MG tablet Take 1 tablet (20 mg total) by mouth at bedtime. 04/04/16   Maren Reamer, MD  Vitamin D, Ergocalciferol, (DRISDOL) 50000 units CAPS capsule Take 1 capsule (50,000 Units total) by mouth every 7 (seven) days. 05/30/16   Maren Reamer, MD    Family History Family History  Problem Relation Age of Onset  . Diabetes Mother   . Hyperlipidemia Mother   . Hypertension Mother   . Colon cancer Maternal Grandmother   . Diabetes Sister   . Hypertension Sister   . Esophageal cancer Neg Hx   . Rectal cancer Neg Hx   . Stomach cancer Neg Hx     Social History Social History   Tobacco Use  . Smoking status:  Former Smoker    Packs/day: 1.50    Years: 18.00    Pack years: 27.00    Types: Cigarettes    Last attempt to quit: 02/09/1998    Years since quitting: 19.2  . Smokeless tobacco: Former Systems developer    Quit date: 03/12/1997  Substance Use Topics  . Alcohol use: No    Alcohol/week: 0.0 oz    Comment: rare  . Drug use: No     Allergies   Aspirin; Morphine and related; Shellfish-derived products; and Alka-seltzer [aspirin effervescent]   Review of Systems Review of Systems Ten systems reviewed and are negative for acute change, except as noted in the HPI.    Physical Exam Updated Vital Signs BP 110/75   Pulse 94   Temp 98.3 F (36.8 C)   Resp 18   Ht 5\' 6"  (1.676 m)   Wt 136.1 kg (300 lb)   SpO2 100%   BMI 48.42 kg/m   Physical Exam  Constitutional: She is oriented to person, place, and time. She appears well-developed and well-nourished. No distress.  Morbidly obese, nontoxic  HENT:  Head: Normocephalic and atraumatic.  Eyes: Conjunctivae and EOM are normal. No scleral icterus.  Neck: Normal range of motion.  Cardiovascular: Normal rate, regular rhythm and intact distal pulses.  Pulmonary/Chest: Effort normal. No stridor. No respiratory distress. She has no wheezes.  Respirations even and unlabored  Abdominal: Soft. She exhibits no mass. There is tenderness. There is no guarding.  Tenderness in the right mid abdomen and upper quadrant.  Negative Murphy sign.  Abdomen soft, obese.  No peritoneal signs.  Musculoskeletal: Normal range of motion.  Neurological: She is alert and oriented to person, place, and time. She exhibits normal muscle tone. Coordination normal.  GCS 15. Patient moving all extremities.  Skin: Skin is warm and dry. No rash noted. She is not diaphoretic. No erythema. No pallor.  Psychiatric: She has a normal mood and affect. Her behavior is normal.  Nursing note and vitals reviewed.    ED Treatments / Results  Labs (all labs ordered are listed, but  only abnormal results are displayed) Labs Reviewed  URINALYSIS, ROUTINE W REFLEX MICROSCOPIC - Abnormal; Notable for the following components:      Result Value   Hgb urine dipstick SMALL (*)    Bacteria, UA RARE (*)    Squamous Epithelial / LPF 6-30 (*)    All other components within normal limits  COMPREHENSIVE METABOLIC PANEL - Abnormal; Notable for the following components:   Glucose, Bld 140 (*)    Calcium 8.8 (*)    Total Protein 8.2 (*)    All other  components within normal limits  CBC WITH DIFFERENTIAL/PLATELET - Abnormal; Notable for the following components:   WBC 10.9 (*)    Lymphs Abs 4.5 (*)    All other components within normal limits  PREGNANCY, URINE  LIPASE, BLOOD    EKG  EKG Interpretation None       Radiology Ct Renal Stone Study  Result Date: 04/30/2017 CLINICAL DATA:  Right flank pain. Recurrent stone disease suspected. EXAM: CT ABDOMEN AND PELVIS WITHOUT CONTRAST TECHNIQUE: Multidetector CT imaging of the abdomen and pelvis was performed following the standard protocol without IV contrast. COMPARISON:  CT 04/11/2016 FINDINGS: Lower chest: Lung bases are clear. Hepatobiliary: Hepatomegaly with liver spanning 22 cm. Diffuse hepatic steatosis. No gross focal lesion allowing for noncontrast exam. Gallbladder partially distended. No calcified stone or pericholecystic inflammation. Pancreas: No ductal dilatation or inflammation. Spleen: Normal in size without focal abnormality. Adrenals/Urinary Tract: Normal adrenal glands. No hydronephrosis, perinephric edema, or urolithiasis. Both ureters are decompressed without stones along the course. Urinary bladder is nondistended without wall thickening or stone. Stomach/Bowel: Stomach is within normal limits. Appendix appears normal, for example coronal image 51. No evidence of bowel wall thickening, distention, or inflammatory changes. Moderate colonic stool burden. Vascular/Lymphatic: Small periportal and gastrohepatic nodes  are likely reactive. No enlarged abdominal or pelvic lymph nodes. Normal caliber abdominal aorta. Reproductive: Uterus and bilateral adnexa are unremarkable. Other: No free air, free fluid, or intra-abdominal fluid collection. Musculoskeletal: Mild degenerative change in the lumbar spine. There are no acute or suspicious osseous abnormalities. IMPRESSION: 1. No renal stone or obstructive uropathy. 2. Hepatomegaly and hepatic steatosis. Electronically Signed   By: Jeb Levering M.D.   On: 04/30/2017 00:58    Procedures Procedures (including critical care time)  Medications Ordered in ED Medications  acetaminophen (TYLENOL) tablet 650 mg (650 mg Oral Given 04/29/17 2215)  dicyclomine (BENTYL) injection 20 mg (20 mg Intramuscular Given 04/30/17 0556)     Initial Impression / Assessment and Plan / ED Course  I have reviewed the triage vital signs and the nursing notes.  Pertinent labs & imaging results that were available during my care of the patient were reviewed by me and considered in my medical decision making (see chart for details).     52 year old female presents to the emergency department for evaluation of 2 days of constant, aching abdominal pain.  Pain is present in the right mid to upper abdomen.  No medications taken prior to arrival for symptoms.  Patient is afebrile, nontoxic.  Vital signs have been stable since arrival.  She has a mild leukocytosis without left shift.  No electrolyte derangements.  Kidney and liver function preserved.  Lipase normal and urinalysis does not suggest infection; consistent with contamination.  A CT scan was ordered while the patient remained in triage.  Imaging shows partially distended gallbladder without evidence of stones or pericholecystic inflammation.  The patient has a negative Murphy sign on exam.  I doubt acute cholecystitis at this time.  Patient does also note similar pain over the past few years which is sporadic.  Also no concern for  appendicitis today.  The appendix was visualized to be normal on CT scan.  There is no evidence of renal stone or obstructive uropathy.  No other acute/emergent intra-abdominal process.  Patient does have mild to moderate constipation.  Will start on MiraLAX.  Bentyl also prescribed for abdominal pain control.  Her abdominal pain may be partially related to metformin and history of diabetes.  Unable to  exclude viral etiology.  Plan to refer to gastroenterology for follow-up.  Return precautions discussed and provided. Patient discharged in stable condition with no unaddressed concerns.   Final Clinical Impressions(s) / ED Diagnoses   Final diagnoses:  Right upper quadrant abdominal pain    ED Discharge Orders        Ordered    dicyclomine (BENTYL) 20 MG tablet  Every 12 hours PRN     04/30/17 0627    polyethylene glycol powder (GLYCOLAX/MIRALAX) powder  Daily     04/30/17 0627       Antonietta Breach, PA-C 04/30/17 0645    Ezequiel Essex, MD 05/01/17 [phone]

## 2017-04-30 NOTE — ED Notes (Signed)
POC urine not done in ED.  Urine pregnancy added on and sent label to LAB

## 2017-11-10 ENCOUNTER — Emergency Department (HOSPITAL_COMMUNITY): Payer: Self-pay

## 2017-11-10 ENCOUNTER — Encounter (HOSPITAL_COMMUNITY): Payer: Self-pay | Admitting: Emergency Medicine

## 2017-11-10 ENCOUNTER — Emergency Department (HOSPITAL_COMMUNITY)
Admission: EM | Admit: 2017-11-10 | Discharge: 2017-11-11 | Disposition: A | Discharge status: Home / Self Care | Payer: Self-pay | Attending: Emergency Medicine | Admitting: Emergency Medicine

## 2017-11-10 DIAGNOSIS — Z79899 Other long term (current) drug therapy: Secondary | ICD-10-CM | POA: Insufficient documentation

## 2017-11-10 DIAGNOSIS — R1011 Right upper quadrant pain: Secondary | ICD-10-CM

## 2017-11-10 DIAGNOSIS — Z7984 Long term (current) use of oral hypoglycemic drugs: Secondary | ICD-10-CM | POA: Insufficient documentation

## 2017-11-10 DIAGNOSIS — E119 Type 2 diabetes mellitus without complications: Secondary | ICD-10-CM | POA: Insufficient documentation

## 2017-11-10 DIAGNOSIS — Z87891 Personal history of nicotine dependence: Secondary | ICD-10-CM | POA: Insufficient documentation

## 2017-11-10 DIAGNOSIS — K2970 Gastritis, unspecified, without bleeding: Secondary | ICD-10-CM | POA: Insufficient documentation

## 2017-11-10 LAB — CBG MONITORING, ED: Glucose-Capillary: 263 mg/dL — ABNORMAL HIGH (ref 70–99)

## 2017-11-10 LAB — LIPASE, BLOOD: Lipase: 43 U/L (ref 11–51)

## 2017-11-10 LAB — COMPREHENSIVE METABOLIC PANEL
ALT: 36 U/L (ref 0–44)
AST: 43 U/L — AB (ref 15–41)
Albumin: 3.4 g/dL — ABNORMAL LOW (ref 3.5–5.0)
Alkaline Phosphatase: 114 U/L (ref 38–126)
Anion gap: 11 (ref 5–15)
BUN: 14 mg/dL (ref 6–20)
CHLORIDE: 99 mmol/L (ref 98–111)
CO2: 25 mmol/L (ref 22–32)
Calcium: 8.6 mg/dL — ABNORMAL LOW (ref 8.9–10.3)
Creatinine, Ser: 0.75 mg/dL (ref 0.44–1.00)
GFR calc Af Amer: >60 mL/min (ref >60)
GLUCOSE: 269 mg/dL — AB (ref 70–99)
Potassium: 4.4 mmol/L (ref 3.5–5.1)
Sodium: 135 mmol/L (ref 135–145)
Total Bilirubin: 1.2 mg/dL (ref 0.3–1.2)
Total Protein: 7.4 g/dL (ref 6.5–8.1)

## 2017-11-10 LAB — CBC
HCT: 45 % (ref 36.0–46.0)
Hemoglobin: 14.5 g/dL (ref 12.0–15.0)
MCH: 29.6 pg (ref 26.0–34.0)
MCHC: 32.2 g/dL (ref 30.0–36.0)
MCV: 91.8 fL (ref 78.0–100.0)
PLATELETS: 243 10*3/uL (ref 150–400)
RBC: 4.9 MIL/uL (ref 3.87–5.11)
RDW: 14.1 % (ref 11.5–15.5)
WBC: 9.7 10*3/uL (ref 4.0–10.5)

## 2017-11-10 LAB — URINALYSIS, ROUTINE W REFLEX MICROSCOPIC
BILIRUBIN URINE: NEGATIVE
KETONES UR: 20 mg/dL — AB
LEUKOCYTES UA: NEGATIVE
Nitrite: NEGATIVE
PH: 6 (ref 5.0–8.0)
PROTEIN: 30 mg/dL — AB
Specific Gravity, Urine: 1.035 — ABNORMAL HIGH (ref 1.005–1.030)

## 2017-11-10 MED ORDER — ONDANSETRON HCL 4 MG/2ML IJ SOLN
4.0000 mg | Freq: Once | INTRAMUSCULAR | Status: AC
  Administered 2017-11-10: 4 mg via INTRAVENOUS
  Filled 2017-11-10: qty 2

## 2017-11-10 MED ORDER — FAMOTIDINE IN NACL 20-0.9 MG/50ML-% IV SOLN
20.0000 mg | Freq: Once | INTRAVENOUS | Status: AC
  Administered 2017-11-10: 20 mg via INTRAVENOUS
  Filled 2017-11-10: qty 50

## 2017-11-10 MED ORDER — SODIUM CHLORIDE 0.9 % IV BOLUS
1000.0000 mL | Freq: Once | INTRAVENOUS | Status: AC
Start: 2017-11-10 — End: 2017-11-10
  Administered 2017-11-10: 1000 mL via INTRAVENOUS

## 2017-11-10 MED ORDER — SUCRALFATE 1 GM/10ML PO SUSP: 1.0000 g | Freq: Three times a day (TID) | ORAL | 0 refills | Status: DC

## 2017-11-10 MED ORDER — FAMOTIDINE 20 MG PO TABS: 20.0000 mg | ORAL_TABLET | Freq: Two times a day (BID) | ORAL | 0 refills | Status: DC

## 2017-11-10 NOTE — Discharge Instructions (Signed)
Return to ED for worsening symptoms, severe chest pain or abdominal pain, vomiting or coughing up blood, leg swelling, lightheadedness or loss of consciousness.

## 2017-11-10 NOTE — ED Notes (Signed)
Pt requesting update on results; PA made aware

## 2017-11-10 NOTE — ED Notes (Signed)
Pt returned from [place] at this time.  Pt is ambulatory to restroom with steady gait.

## 2017-11-10 NOTE — ED Notes (Signed)
Pt returned from CT at this time.  

## 2017-11-10 NOTE — ED Provider Notes (Signed)
[facility] EMERGENCY DEPARTMENT Provider Note   CSN: [id number] Arrival date & time: 11/10/17  1523     History   Chief Complaint Chief Complaint  Patient presents with  . Abdominal Pain  . Flank Pain    HPI Cataleia Gade is a 52 y.o. female with a past medical history of diabetes, osteoarthritis, kidney stones, obesity, who presents to ED for evaluation of intermittent right upper quadrant, right flank, right lower quadrant abdominal pain for the past 3 years.  States that the pain has worsened over the past 2 weeks.  She is seen several providers and has underwent CT scans, endoscopy, colonoscopy with no specific cause of her pain found.  She reports nausea but denies any vomiting.  States that the pain is increased after eating.  No improvement with her home Bentyl.  She reports feeling thirstier than usual.  She reports compliance with her home metformin but does not check her CBGs.  She denies any changes to urination, frequent urination, dysuria, fever, sick contacts, recent travel, changes in bowel movements, vaginal complaints, chest pain, shortness of breath.  She denies any prior abdominal surgeries, alcohol, tobacco or other drug use.  Denies chronic NSAID use.  HPI  Past Medical History:  Diagnosis Date  . Arthritis   . Diabetes mellitus without complication ([place])    takes glucaphage  . 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   . Fatty liver   . Kidney stone 9-10 years ago  . Kidney stone   . Morbid obesity ([place]) 04/06/2014   BMI 50.6   . Pain in joint, ankle and foot 04/06/2014   LEFT MRI 10/1015  . Sternal pain 06/21/2014    Patient Active Problem List   Diagnosis Date Noted  . Pain in Achilles tendon 08/26/2016  . Achilles tendon tear, left, sequela 12/30/2015  . Diabetes mellitus without complication ([place]) 09/32/3557  . Sternal pain 06/21/2014  . Morbid  obesity ([place]) 04/06/2014  . Pain in joint, ankle and foot 04/06/2014  . DJD (degenerative joint disease) of knee 04/06/2014    Past Surgical History:  Procedure Laterality Date  . CESAREAN SECTION    . LITHOTRIPSY       OB History    Gravida  7   Para  2   Term  2   Preterm      AB  5   Living  2     SAB  5   TAB      Ectopic      Multiple      Live Births               Home Medications    Prior to Admission medications   Medication Sig Start Date End Date Taking? Authorizing Provider  diclofenac sodium (VOLTAREN) 1 % GEL Apply 2 g topically 4 (four) times daily. 04/04/16  Yes Langeland, Dawn T, MD  dicyclomine (BENTYL) 20 MG tablet Take 1 tablet (20 mg total) by mouth every 12 (twelve) hours as needed (for abdominal pain/cramping). 04/30/17  Yes Antonietta Breach, PA-C  metFORMIN (GLUCOPHAGE-XR) 500 MG 24 hr tablet Take 1 tablet (500 mg total) by mouth daily with breakfast. 09/13/16  Yes Ladell Pier, MD  famotidine (PEPCID) 20 MG tablet Take 1 tablet (20 mg total) by mouth 2 (two) times daily. 11/10/17   Joyelle Siedlecki, PA-C  lisinopril (PRINIVIL,ZESTRIL) 5 MG tablet Take 1 tablet (5  mg total) by mouth daily. Patient not taking: Reported on 11/10/2017 09/13/16   Ladell Pier, MD  nitroGLYCERIN (NITRODUR - DOSED IN MG/24 HR) 0.2 mg/hr patch Cut patch into one - fourth pieces Place a one fourth piece of patch on  skin over affected area, changing to a new piece every 24 hours. Patient not taking: Reported on 11/10/2017 10/21/15   Dickie La, MD  pantoprazole (PROTONIX) 40 MG tablet Take 1 tablet (40 mg total) by mouth daily. Patient not taking: Reported on 11/10/2017 09/13/16   Ladell Pier, MD  polyethylene glycol powder (GLYCOLAX/MIRALAX) powder Take 17 g by mouth daily. Until daily soft stools  OTC Patient not taking: Reported on 11/10/2017 04/30/17   Antonietta Breach, PA-C  pravastatin (PRAVACHOL) 20 MG tablet Take 1 tablet (20 mg total) by mouth at  bedtime. Patient not taking: Reported on 11/10/2017 04/04/16   Maren Reamer, MD  sucralfate (CARAFATE) 1 GM/10ML suspension Take 10 mLs (1 g total) by mouth 4 (four) times daily -  with meals and at bedtime. 11/10/17   Brennan Karam, PA-C  Vitamin D, Ergocalciferol, (DRISDOL) 50000 units CAPS capsule Take 1 capsule (50,000 Units total) by mouth every 7 (seven) days. Patient not taking: Reported on 11/10/2017 05/30/16   Maren Reamer, MD    Family History Family History  Problem Relation Age of Onset  . Diabetes Mother   . Hyperlipidemia Mother   . Hypertension Mother   . Colon cancer Maternal Grandmother   . Diabetes Sister   . Hypertension Sister   . Esophageal cancer Neg Hx   . Rectal cancer Neg Hx   . Stomach cancer Neg Hx     Social History Social History   Tobacco Use  . Smoking status: Former Smoker    Packs/day: 1.50    Years: 18.00    Pack years: 27.00    Types: Cigarettes    Last attempt to quit: 02/09/1998    Years since quitting: 19.7  . Smokeless tobacco: Former Systems developer    Quit date: 03/12/1997  Substance Use Topics  . Alcohol use: No    Alcohol/week: 0.0 standard drinks    Comment: rare  . Drug use: No     Allergies   Aspirin; Morphine and related; Shellfish-derived products; and Alka-seltzer [aspirin effervescent]   Review of Systems Review of Systems  Constitutional: Negative for appetite change, chills and fever.  HENT: Negative for ear pain, rhinorrhea, sneezing and sore throat.   Eyes: Negative for photophobia and visual disturbance.  Respiratory: Negative for cough, chest tightness, shortness of breath and wheezing.   Cardiovascular: Negative for chest pain and palpitations.  Gastrointestinal: Positive for abdominal pain and nausea. Negative for blood in stool, constipation, diarrhea and vomiting.  Endocrine: Positive for polydipsia.  Genitourinary: Negative for dysuria, hematuria and urgency.  Musculoskeletal: Negative for myalgias.  Skin:  Negative for rash.  Neurological: Negative for dizziness, weakness and light-headedness.     Physical Exam Updated Vital Signs BP 139/69   Pulse 84   Temp 98.1 F (36.7 C)   Resp 18   LMP 02/14/2016   SpO2 93%   Physical Exam  Constitutional: She appears well-developed and well-nourished. No distress.  Nontoxic-appearing and in no acute distress.  HENT:  Head: Normocephalic and atraumatic.  Nose: Nose normal.  Eyes: Conjunctivae and EOM are normal. Left eye exhibits no discharge. No scleral icterus.  Neck: Normal range of motion. Neck supple.  Cardiovascular: Normal rate, regular rhythm, normal heart  sounds and intact distal pulses. Exam reveals no gallop and no friction rub.  No murmur heard. Pulmonary/Chest: Effort normal and breath sounds normal. No respiratory distress.  Abdominal: Soft. Bowel sounds are normal. She exhibits no distension. There is tenderness (RLQ, RUQ, R flank) in the right upper quadrant and right lower quadrant. There is no rebound and no guarding.  Musculoskeletal: Normal range of motion. She exhibits no edema.  No lower extremity edema, erythema or calf tenderness bilaterally.  Neurological: She is alert. She exhibits normal muscle tone. Coordination normal.  Skin: Skin is warm and dry. No rash noted.  Psychiatric: She has a normal mood and affect.  Nursing note and vitals reviewed.    ED Treatments / Results  Labs (all labs ordered are listed, but only abnormal results are displayed) Labs Reviewed  COMPREHENSIVE METABOLIC PANEL - Abnormal; Notable for the following components:      Result Value   Glucose, Bld 269 (*)    Calcium 8.6 (*)    Albumin 3.4 (*)    AST 43 (*)    All other components within normal limits  URINALYSIS, ROUTINE W REFLEX MICROSCOPIC - Abnormal; Notable for the following components:   APPearance HAZY (*)    Specific Gravity, Urine 1.035 (*)    Glucose, UA >=500 (*)    Hgb urine dipstick SMALL (*)    Ketones, ur 20 (*)     Protein, ur 30 (*)    Bacteria, UA FEW (*)    All other components within normal limits  CBG MONITORING, ED - Abnormal; Notable for the following components:   Glucose-Capillary 263 (*)    All other components within normal limits  LIPASE, BLOOD  CBC    EKG None  Radiology Ct Renal Stone Study  Result Date: 11/10/2017 CLINICAL DATA:  52 year old female with right lower quadrant abdominal pain and flank pain. Concern for kidney stone. EXAM: CT ABDOMEN AND PELVIS WITHOUT CONTRAST TECHNIQUE: Multidetector CT imaging of the abdomen and pelvis was performed following the standard protocol without IV contrast. COMPARISON:  Right upper quadrant ultrasound dated 11/10/2017 and CT of the abdomen pelvis dated 09/27/2017 FINDINGS: Evaluation of this exam is limited in the absence of intravenous contrast. Lower chest: The visualized lung bases are clear. No intra-abdominal free air or free fluid. Hepatobiliary: There is diffuse fatty infiltration of the liver. Abnormal liver size measuring 16 cm in midclavicular length. No intrahepatic biliary ductal dilatation. The gallbladder is unremarkable. Pancreas: Unremarkable. No pancreatic ductal dilatation or surrounding inflammatory changes. Spleen: Normal in size without focal abnormality. Adrenals/Urinary Tract: Adrenal glands are unremarkable. Kidneys are normal, without renal calculi, focal lesion, or hydronephrosis. Bladder is unremarkable. Stomach/Bowel: Stomach is within normal limits. Appendix appears normal. No evidence of bowel wall thickening, distention, or inflammatory changes. Vascular/Lymphatic: The abdominal aorta and IVC are grossly unremarkable on this noncontrast CT. No portal venous gas. There is no adenopathy. Reproductive: The uterus is anteverted and grossly unremarkable. The ovaries are unremarkable as well. No adnexal masses. Other: Small fat containing umbilical hernia. Musculoskeletal: Mild degenerative changes of the spine. No acute  osseous pathology. IMPRESSION: 1. No hydronephrosis or nephrolithiasis. 2. No bowel obstruction or active inflammation.  Normal appendix. 3. Fatty liver Electronically Signed   By: Anner Crete M.D.   On: 11/10/2017 21:41   US Abdomen Limited Ruq  Result Date: 11/10/2017 CLINICAL DATA:  Right upper quadrant abdominal pain. EXAM: ULTRASOUND ABDOMEN LIMITED RIGHT UPPER QUADRANT COMPARISON:  CT abdomen pelvis 04/30/2017 FINDINGS: Gallbladder: No  gallstones or wall thickening visualized. No sonographic Murphy sign noted by sonographer. Common bile duct: Diameter: 3 mm Liver: Diffusely increased in echogenicity. Portal vein is patent on color Doppler imaging with normal direction of blood flow towards the liver. IMPRESSION: No cholelithiasis or sonographic evidence for acute cholecystitis. Hepatic steatosis. Electronically Signed   By: Lovey Newcomer M.D.   On: 11/10/2017 20:11    Procedures Procedures (including critical care time)  Medications Ordered in ED Medications  sodium chloride 0.9 % bolus 1,000 mL (0 mLs Intravenous Stopped 11/10/17 1912)  famotidine (PEPCID) IVPB 20 mg premix (0 mg Intravenous Stopped 11/10/17 1740)  ondansetron (ZOFRAN) injection 4 mg (4 mg Intravenous Given 11/10/17 1644)     Initial Impression / Assessment and Plan / ED Course  I have reviewed the triage vital signs and the nursing notes.  Pertinent labs & imaging results that were available during my care of the patient were reviewed by me and considered in my medical decision making (see chart for details).     52 year old female with a past medical history of diabetes, osteoarthritis, kidney stones presents to ED for evaluation of intermittent right upper quadrant pain, right flank pain and right lower quadrant abdominal pain over the past 3 years.  States that the pain is worsened over the past 2 weeks.  She is seen several providers, GI specialist and has underwent CT scans, endoscopy, colonoscopy with no specific  cause of her pain.  No improvement with Bentyl.  She denies any chest pain, shortness of breath, hemoptysis, changes to bowel movements, vomiting, urinary symptoms.  On exam patient is overall well-appearing.  Tenderness to palpation of the entire right side of the abdomen with no rebound or guarding noted.  Lungs are clear to auscultation bilaterally.  She is afebrile.  No CVA tenderness noted.  Lab work including CMP, CBC, lipase unremarkable.  Urine shows some ketones.  AST slightly elevated at 43.  Right upper quadrant ultrasound negative for acute abnormalities.  CT renal stone study shows no acute abnormalities.  Patient remains in NAD.  Reports improvement in her symptoms with Pepcid, fluids given here in the ED.  Normal and anion gap noted.  No signs of DKA noted.  Will give symptomatic treatment with antacids, anti-inflammatories and have her follow-up with her PCP and GI specialist for further evaluation.  Advised to return to ED for any severe or worsening symptoms.  Portions of this note were generated with Lobby[employer]. Dictation errors may occur despite best attempts at proofreading.   Final Clinical Impressions(s) / ED Diagnoses   Final diagnoses:  RUQ abdominal pain  Gastritis without bleeding, unspecified chronicity, unspecified gastritis type    ED Discharge Orders         Ordered    famotidine (PEPCID) 20 MG tablet  2 times daily     11/10/17 2317    sucralfate (CARAFATE) 1 GM/10ML suspension  3 times daily with meals & bedtime     11/10/17 2317           Delia Heady, PA-C 11/10/17 2318    Duffy Bruce, MD 11/11/17 1258

## 2017-11-10 NOTE — ED Triage Notes (Signed)
Pt states over 1 year of RLQ abdominal pain that comes and goes, states it has been constant for 10 days. Denies diarrhea. Does have nausea. BM are normal. PT also has right sided flank pain, denies urinary symptoms and vaginal symptoms.

## 2017-11-10 NOTE — ED Notes (Signed)
Patient transported to Ultrasound 

## 2022-02-27 ENCOUNTER — Other Ambulatory Visit: Payer: Self-pay

## 2022-02-27 ENCOUNTER — Emergency Department (HOSPITAL_COMMUNITY)
Admission: EM | Admit: 2022-02-27 | Discharge: 2022-02-27 | Discharge status: Against Medical Advice | Payer: Self-pay | Attending: Emergency Medicine | Admitting: Emergency Medicine

## 2022-02-27 DIAGNOSIS — U071 COVID-19: Secondary | ICD-10-CM | POA: Insufficient documentation

## 2022-02-27 DIAGNOSIS — Z5321 Procedure and treatment not carried out due to patient leaving prior to being seen by health care provider: Secondary | ICD-10-CM | POA: Insufficient documentation

## 2022-02-27 DIAGNOSIS — R109 Unspecified abdominal pain: Secondary | ICD-10-CM | POA: Insufficient documentation

## 2022-02-27 DIAGNOSIS — R112 Nausea with vomiting, unspecified: Secondary | ICD-10-CM | POA: Insufficient documentation

## 2022-02-27 LAB — CBC WITH DIFFERENTIAL/PLATELET
Abs Immature Granulocytes: 0.06 10*3/uL (ref 0.00–0.07)
Basophils Absolute: 0 10*3/uL (ref 0.0–0.1)
Basophils Relative: 0 %
Eosinophils Absolute: 0 10*3/uL (ref 0.0–0.5)
Eosinophils Relative: 0 %
HCT: 45.5 % (ref 36.0–46.0)
Hemoglobin: 15.1 g/dL — ABNORMAL HIGH (ref 12.0–15.0)
Immature Granulocytes: 1 %
Lymphocytes Relative: 10 %
Lymphs Abs: 1.2 10*3/uL (ref 0.7–4.0)
MCH: 30.8 pg (ref 26.0–34.0)
MCHC: 33.2 g/dL (ref 30.0–36.0)
MCV: 92.9 fL (ref 80.0–100.0)
Monocytes Absolute: 0.8 10*3/uL (ref 0.1–1.0)
Monocytes Relative: 6 %
Neutro Abs: 10.6 10*3/uL — ABNORMAL HIGH (ref 1.7–7.7)
Neutrophils Relative %: 83 %
Platelets: 212 10*3/uL (ref 150–400)
RBC: 4.9 MIL/uL (ref 3.87–5.11)
RDW: 15.2 % (ref 11.5–15.5)
WBC: 12.7 10*3/uL — ABNORMAL HIGH (ref 4.0–10.5)
nRBC: 0 % (ref 0.0–0.2)

## 2022-02-27 LAB — LIPASE, BLOOD: Lipase: 27 U/L (ref 11–51)

## 2022-02-27 LAB — COMPREHENSIVE METABOLIC PANEL
ALT: 102 U/L — ABNORMAL HIGH (ref 0–44)
AST: 105 U/L — ABNORMAL HIGH (ref 15–41)
Albumin: 2.8 g/dL — ABNORMAL LOW (ref 3.5–5.0)
Alkaline Phosphatase: 245 U/L — ABNORMAL HIGH (ref 38–126)
Anion gap: 17 — ABNORMAL HIGH (ref 5–15)
BUN: 37 mg/dL — ABNORMAL HIGH (ref 6–20)
CO2: 22 mmol/L (ref 22–32)
Calcium: 8.8 mg/dL — ABNORMAL LOW (ref 8.9–10.3)
Chloride: 95 mmol/L — ABNORMAL LOW (ref 98–111)
Creatinine, Ser: 2.72 mg/dL — ABNORMAL HIGH (ref 0.44–1.00)
GFR, Estimated: 20 mL/min — ABNORMAL LOW (ref >60)
Glucose, Bld: 170 mg/dL — ABNORMAL HIGH (ref 70–99)
Potassium: 4.2 mmol/L (ref 3.5–5.1)
Sodium: 134 mmol/L — ABNORMAL LOW (ref 135–145)
Total Bilirubin: 5.6 mg/dL — ABNORMAL HIGH (ref 0.3–1.2)
Total Protein: 8.9 g/dL — ABNORMAL HIGH (ref 6.5–8.1)

## 2022-02-27 LAB — RESP PANEL BY RT-PCR (RSV, FLU A&B, COVID)  RVPGX2
Influenza A by PCR: NEGATIVE
Influenza B by PCR: NEGATIVE
Resp Syncytial Virus by PCR: NEGATIVE
SARS Coronavirus 2 by RT PCR: POSITIVE — AB

## 2022-02-27 MED ORDER — ACETAMINOPHEN 325 MG PO TABS
650.0000 mg | ORAL_TABLET | Freq: Once | ORAL | Status: AC
  Administered 2022-02-27: 650 mg via ORAL
  Filled 2022-02-27: qty 2

## 2022-02-27 MED ORDER — ONDANSETRON 4 MG PO TBDP
4.0000 mg | ORAL_TABLET | Freq: Once | ORAL | Status: AC
  Administered 2022-02-27: 4 mg via ORAL
  Filled 2022-02-27: qty 1

## 2022-02-27 MED ORDER — IBUPROFEN 400 MG PO TABS: 400.0000 mg | ORAL_TABLET | Freq: Once | ORAL | Status: DC | PRN

## 2022-02-27 NOTE — ED Notes (Signed)
Zofran administered, pt AxOx4. NAD

## 2022-02-27 NOTE — ED Provider Triage Note (Signed)
Emergency Medicine Provider Triage Evaluation Note  Amanda Santiago , a 56 y.o. female  was evaluated in triage.  Pt complains of Flu like sxs x 3 days.  N/V/ abd pain, myalgaias, headache, fatigue. Hx of gastritis. Took advil/ tylenol w/o releif.  Review of Systems  Positive: Flu like sxs Negative: fever  Physical Exam  BP 129/85   Pulse 90   Temp (!) 97.2 F (36.2 C) (Oral)   Resp (!) 24   LMP 02/10/2016 (Approximate) Comment: periods are irregular  SpO2 96%  Gen:   Awake, no distress   Resp:  Normal effort  MSK:   Moves extremities without difficulty  Other:  No abd distension. Generalized tenderness  Medical Decision Making  Medically screening exam initiated at 12:25 PM.  Appropriate orders placed.  Julann Mcgilvray was informed that the remainder of the evaluation will be completed by another provider, this initial triage assessment does not replace that evaluation, and the importance of remaining in the ED until their evaluation is complete.     Margarita Mail, PA-C 02/27/22 1226

## 2022-02-27 NOTE — ED Triage Notes (Signed)
Pt with abdominal pain with n/v since Sunday. Also reports headache, chills, and body aches. Has so far today taken 800 mg and '1000mg'$  Tylenol without relief in headache.

## 2022-10-22 ENCOUNTER — Other Ambulatory Visit: Payer: Self-pay

## 2022-10-22 DIAGNOSIS — Z1231 Encounter for screening mammogram for malignant neoplasm of breast: Secondary | ICD-10-CM

## 2022-11-05 ENCOUNTER — Other Ambulatory Visit: Payer: Self-pay

## 2022-11-05 ENCOUNTER — Inpatient Hospital Stay: Payer: Self-pay | Attending: Obstetrics and Gynecology | Admitting: *Deleted

## 2022-11-05 VITALS — BP 150/79 | Ht 67.0 in | Wt 336.0 lb

## 2022-11-05 DIAGNOSIS — Z0000 Encounter for general adult medical examination without abnormal findings: Secondary | ICD-10-CM

## 2022-11-05 NOTE — Progress Notes (Signed)
[facility] initial screening   Interpreter- Natale Lay, [place]   Clinical Measurement:  Vitals:   11/05/22 0938  BP: (!) 149/83   Fasting Labs Drawn Today, will review with patient when they result.   Medical History: Patient states that she does not know if she has high cholesterol, has high blood pressure and she has diabetes.  Medications: Patient states that she does take medication to lower cholesterol, blood pressure or blood sugar.  Patient does not take an aspirin a day to help prevent a heart attack or stroke. During the past 7 days patient has taken prescribed medication to lower blood pressure and blood sugar on 7 days.   Blood pressure, self measurement: Patient states that she does measure blood pressure from home. She checks her blood pressure a few times per week. She shares her readings with a health care provider: no.   Nutrition: Patient states that on average she eats 2 cups of fruit and 0 cups of vegetables per day. Patient states that she does not eat fish at least 2 times per week. Patient eats less than half servings of whole grains. Patient drinks less than 36 ounces of beverages with added sugar weekly: yes. Patient is currently watching sodium or salt intake: yes. In the past 7 days patient has consumed drinks containing alcohol on 0 days. On a day that patient consumes drinks containing alcohol on average 0 drinks are consumed.      Physical activity: Patient states that she gets 0 minutes of moderate and 0 minutes of vigorous physical activity each week.  Smoking status: Patient states that she has is a former smoker, quit 12+ months ago .   Quality of life: Over the past 2 weeks patient states that she had little interest or pleasure in doing things: nearly everyday. She has been feeling down, depressed or hopeless:nearly everyday.   Social Determinants of Health Assessment:   Computer Use: During the last 12 months patient states that she has used any of the  following: desktop/laptop, smart phone or tablet/other portable wireless computer: yes.   Internet Use: During the last 12 months, did you or any member of your household have access to the internet: Yes, by paying a cell phone company or internet service provider.   Food Insecurities: During the last 12 months, where there any times when you were worried that you would run out of food because of a lack of money or other resources: No.   Transportation Barriers: During the last 12 months, have you missed a doctor's appointment because of transportation problems: No.   Childcare Barriers: If you are currently using childcare services, please identify  the type of services you use. (If not using childcare services, please select "Not applicable"): not applicable. During the last 12 months, have you had any barriers to childcare services such as: not applicable.   Housing: What is your housing situation today: I have housing.   Intimate Partner Violence: During the last 12 months, how often did your partner physically hurt you:  NA . During the last 12 months, how often did your partner insult you or talk down to you:  NA .  Medication Adherence: During the last 12 months, did you ever forget to take your medicine: No. During the last 12 months, were you careless ar times about taking your medicine: No. During the last 12 months, when you felt better did you sometimes stop taking your medication: No. During the last 12 months, sometimes if  you felt worse when you took your medicine did you stop taking it: No.   Risk reduction and counseling:   Health Coaching: Spoke with patient about the daily recommendation for fruits and vegetables. Showed patient what a serving size would look like. Spoke with patient about adding more heart healthy fish into diet. Gave examples of salmon, tuna, mackerel, sardines, sea bass or trout. Patient consumes whole wheat bread at times. Gave examples of other whole grains  that she can try adding into her regular diet (whole wheat bread, cereals, brown rice, whole wheat pasta or oatmeal). Encouraged patient to continue watching the amount of sodium that she consumes due to elevated BP. Patient has been doing some exercising in a pool. Patient is unable to do other forms of exercise to to pain with her joints. Encouraged her to continue in the pool as often as she can due to ir being low-impact.    Goal: Patient will add more vegetables into daily diet. Patient will start by adding in one serving of vegetables (1 cup) daily into her diet over the next month. After patient reaches this goal she will increase daily servings.   Navigation:  I will notify patient of lab results.  Patient is aware of 2 more health coaching sessions and a follow up. Will refer patient to counseling services through [facility]. Will refer patient to Internal Medicine for FU for elevated BP.  Time: 30 minutes

## 2022-11-06 LAB — LIPID PANEL
Chol/HDL Ratio: 2.1 ratio (ref 0.0–4.4)
Cholesterol, Total: 148 mg/dL (ref 100–199)
HDL: 69 mg/dL (ref >39)
LDL Chol Calc (NIH): 59 mg/dL (ref 0–99)
Triglycerides: 110 mg/dL (ref 0–149)
VLDL Cholesterol Cal: 20 mg/dL (ref 5–40)

## 2022-11-06 LAB — GLUCOSE, RANDOM: Glucose: 104 mg/dL — ABNORMAL HIGH (ref 70–99)

## 2022-11-06 LAB — HEMOGLOBIN A1C
Est. average glucose Bld gHb Est-mCnc: 131 mg/dL
Hgb A1c MFr Bld: 6.2 % — ABNORMAL HIGH (ref 4.8–5.6)

## 2022-11-14 ENCOUNTER — Telehealth: Payer: Self-pay

## 2022-11-14 NOTE — Telephone Encounter (Signed)
Left message for patient via Rudene Anda, Erling Cruz about lab results from [facility]. Left name and number for patient to call back.

## 2022-11-14 NOTE — Telephone Encounter (Signed)
Health coaching 2   interpreter- Natale Lay, [employer]   Labs-148 cholesterol, 59 LDL cholesterol, 110 triglycerides, 69 HDL cholesterol, 6.2 hemoglobin A1C, 104 mean plasma glucose. Patient understands and is aware of her lab results.   Goals-  1. Daily exercise for at least 20-30 minutes. Patient is looking for a gym to join that has a pool. Low impact exercising would be better for the patient due to joint pain.  2. Continue to watch the amount of sweet and sugary foods and drinks. 3. Continue to watch the amount of carbs consumed.  Patient will speak with Internal Medicine to see if she can take part in the prescription program to help with cost of Ozempic. Patient is currently paying out of pocket monthly.    Navigation:  Patient is aware of 1 more health coaching sessions and a follow up. Patient is scheduled for FU with Internal Medicine   Time- 10 minutes

## 2022-11-19 ENCOUNTER — Other Ambulatory Visit (HOSPITAL_COMMUNITY): Payer: Self-pay

## 2022-11-19 ENCOUNTER — Ambulatory Visit (INDEPENDENT_AMBULATORY_CARE_PROVIDER_SITE_OTHER): Payer: Self-pay | Admitting: Student

## 2022-11-19 ENCOUNTER — Encounter: Payer: Self-pay | Admitting: Student

## 2022-11-19 VITALS — BP 141/68 | HR 80 | Temp 98.1°F | Wt 339.4 lb

## 2022-11-19 DIAGNOSIS — Z7985 Long-term (current) use of injectable non-insulin antidiabetic drugs: Secondary | ICD-10-CM

## 2022-11-19 DIAGNOSIS — K760 Fatty (change of) liver, not elsewhere classified: Secondary | ICD-10-CM

## 2022-11-19 DIAGNOSIS — E119 Type 2 diabetes mellitus without complications: Secondary | ICD-10-CM

## 2022-11-19 DIAGNOSIS — I10 Essential (primary) hypertension: Secondary | ICD-10-CM

## 2022-11-19 DIAGNOSIS — R7989 Other specified abnormal findings of blood chemistry: Secondary | ICD-10-CM

## 2022-11-19 MED ORDER — OZEMPIC (1 MG/DOSE) 4 MG/3ML ~~LOC~~ SOPN: 1.0000 mg | PEN_INJECTOR | SUBCUTANEOUS | 2 refills | Status: DC

## 2022-11-19 NOTE — Assessment & Plan Note (Signed)
Patient has a past medical history of hepatic steatosis confirmed by right upper quadrant ultrasound.  Previous labs showing elevated liver enzymes.  Will repeat CMP today.  Plan: -Repeat liver enzymes  Addendum: Liver enzymes decreasing.  Alk phos decreasing.

## 2022-11-19 NOTE — Assessment & Plan Note (Signed)
Patient has a past medical history of hypertension.  Blood pressure today 141/68.  She does report she takes captopril which she gets from [place].  Patient does have slightly elevated blood pressure today.  Patient continues to have elevated blood pressures, will likely need to stop captopril and start other agents.  Plan: -Monitor blood pressure closely.  Addendum: Discussed patient should stop captopril.  She agreed.  Starting lisinopril.  Can obtain CMP in 1 month.

## 2022-11-19 NOTE — Assessment & Plan Note (Addendum)
Patient has a past medical history of type 2 diabetes mellitus.  Patient recently had was on the labs which showed an A1c of 6.2.  It is well-controlled.  Patient currently takes Ozempic 1 mg weekly.  She states compliance with medication.  The concern for her today is that she is having trouble affording this medication.  She states her family pays for it, and this makes her distressed.  Diabetes is well-controlled, and I think patient could benefit from getting her medications from the outpatient pharmacy.  Will reach out to our pharmacy technician to see if it would be cheaper to get it through our [facility] health pharmacy.  Plan: -Reach out to pharmacy technician -Continue Ozempic 1 mg weekly -Follow-up in 3 months for A1c check -Follow-up on CMP, if liver enzymes within normal limits can start statin

## 2022-11-19 NOTE — Progress Notes (Addendum)
CC: Wise woman   HPI:  Amanda Santiago is a 57 y.o. female with past medical history of type 2 diabetes presenting to the clinic for wisewoman lab follow-up.  Please see assessment and plan for full HPI.  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    Fatty liver    Kidney stone 9-10 years ago   Kidney stone    Morbid obesity (HCC) 04/06/2014   BMI 50.6    Pain in joint, ankle and foot 04/06/2014   LEFT MRI 10/1015   Sternal pain 06/21/2014     Current Outpatient Medications:    captopril (CAPOTEN) 25 MG tablet, Take 25 mg by mouth 2 (two) times daily., Disp: , Rfl:    diclofenac sodium (VOLTAREN) 1 % GEL, Apply 2 g topically 4 (four) times daily., Disp: 100 g, Rfl: 2   dicyclomine (BENTYL) 20 MG tablet, Take 1 tablet (20 mg total) by mouth every 12 (twelve) hours as needed (for abdominal pain/cramping)., Disp: 20 tablet, Rfl: 0   diphenhydrAMINE (BENADRYL) 25 mg capsule, Take 25 mg by mouth every 6 (six) hours as needed., Disp: , Rfl:    famotidine (PEPCID) 20 MG tablet, Take 1 tablet (20 mg total) by mouth 2 (two) times daily., Disp: 30 tablet, Rfl: 0   NON FORMULARY, Joint pain supplement from [place]. F.T.X Plus., Disp: , Rfl:    OZEMPIC, 1 MG/DOSE, 4 MG/3ML SOPN, Inject 1 mg into the skin once a week., Disp: 3 mL, Rfl: 2   sucralfate (CARAFATE) 1 GM/10ML suspension, Take 10 mLs (1 g total) by mouth 4 (four) times daily -  with meals and at bedtime., Disp: 420 mL, Rfl: 0   Vitamin D, Ergocalciferol, (DRISDOL) 50000 units CAPS capsule, Take 1 capsule (50,000 Units total) by  mouth every 7 (seven) days., Disp: 12 capsule, Rfl: 0  Current Facility-Administered Medications:    0.9 %  sodium chloride infusion, 500 mL, Intravenous, Continuous, Nandigam, Kavitha V, MD  Review of Systems:   Negative except what is stated in HPI.   Physical Exam:  Vitals:   11/19/22 1327  BP: (!) 141/68  Pulse: 80  Temp: 98.1 F (36.7 C)  TempSrc: Oral  SpO2: 96%  Weight: (!) 339 lb 6.4 oz (154 kg)    General: Patient is sitting comfortably in the room  Head: Normocephalic, atraumatic  Cardio: Regular rate and rhythm, no murmurs, rubs or gallops Chest: No chest tenderness Pulmonary: Clear to ausculation bilaterally with no rales, rhonchi, and crackles    Assessment & Plan:   Diabetes mellitus without complication (HCC) Patient has a past medical history of type 2 diabetes mellitus.  Patient recently had was on the labs which showed an A1c of 6.2.  It is well-controlled.  Patient currently takes Ozempic 1 mg weekly.  She states compliance with medication.  The concern for her today is that she is having trouble affording this medication.  She states her family pays for it, and this makes her distressed.  Diabetes is well-controlled, and I think patient could benefit from getting her medications from the outpatient pharmacy.  Will reach out to our pharmacy technician to see if it would be cheaper to get it through our [facility] pharmacy.  Plan: -Reach out to pharmacy technician -Continue Ozempic 1 mg weekly -Follow-up in 3 months for A1c check -Follow-up on CMP, if liver enzymes within normal limits can start statin  Elevated serum creatinine Patient recently had labs about 6 months ago which showed elevated creatinine 2.72.  Unclear if this was due to AKI versus CKD.  Will recheck CMP today to evaluate kidney function and electrolytes.  Plan: -Follow-up CMP  Hepatic steatosis Patient has a past medical history of hepatic steatosis confirmed by right upper quadrant  ultrasound.  Previous labs showing elevated liver enzymes.  Will repeat CMP today.  Plan: -Repeat liver enzymes  Hypertension Patient has a past medical history of hypertension.  Blood pressure today 141/68.  She does report she takes captopril which she gets from [place].  Patient does have slightly elevated blood pressure today.  Patient continues to have elevated blood pressures, will likely need to stop captopril and start other agents.  Plan: -Monitor blood pressure closely.  Patient was evaluated and examined by medical student.  I also independently conducted history and physical exam.  Assessment and plan was created together with medical student and I.  Patient discussed with Dr. Jerline Pain, DO PGY-2 Internal Medicine Resident  Pager: [phone]

## 2022-11-19 NOTE — Patient Instructions (Addendum)
Belzora, Cravotta you for allowing me to take part in your care today.  Here are your instructions.  1.  Regarding your diabetes medications, continue taking your Ozempic weekly.  I am going to reach out to my pharmacy technician today, to see which medications would be cheaper for you and to see which medications could you qualify for assistance.  Some of these include Trulicity and Mounjaro.  Will call you for any other medication assistance.  In the meantime, please apply for the orange card.  2.  I have updated your disability parking pass today.  3.  Please come back in 3 months and at that time we can address your diabetes again and discuss your liver and kidneys.  4.  I am evaluating your kidneys and liver function today.  Please await phone call for your results.  Thank you, Dr. Allena Katz  If you have any other questions please contact the internal medicine clinic at [phone] If it is after hours, please call the [facility] at [phone] and then ask the person who picks up for the resident on call.    Griffith Citron, gracias por permitirme participar en su atencin hoy.  Aqu estn tus instrucciones.  1. Respecto a tus medicamentos para la diabetes, contina tomando tu Ozempic semanalmente.  Hoy me comunicar con mi tcnico de farmacia para ver qu medicamentos seran ms baratos para usted y para ver qu medicamentos podra calificar para recibir asistencia.  Algunos de estos incluyen Trulicity y [place].  Lo llamar para cualquier otra asistencia con medicamentos.  Mientras tanto, solicita la tarjeta naranja.  2. He actualizado tu pase de estacionamiento para discapacitados hoy.  3. Vuelva en 3 meses y en ese momento podremos abordar su diabetes nuevamente y hablar sobre su hgado y riones.  4. Hoy estoy evaluando la funcin de sus riones y [employer].  Espere la llamada telefnica para Ambulance person.  Karl Pock, doctor Hendy Brindle  Si tiene  alguna otra pregunta, comunquese con la clnica de medicina interna al [phone]. Si es fuera del horario de [place], llame al hospital [facility] al [phone] y luego pregntele a la persona que recoge al residente de guardia.

## 2022-11-19 NOTE — Assessment & Plan Note (Signed)
Patient recently had labs about 6 months ago which showed elevated creatinine 2.72.  Unclear if this was due to AKI versus CKD.  Will recheck CMP today to evaluate kidney function and electrolytes.  Plan: -Follow-up CMP

## 2022-11-19 NOTE — Progress Notes (Deleted)
This is a Psychologist, occupational Note.  The care of the patient was discussed with Dr. Marland Kitchen and the assessment and plan was formulated with their assistance.  Please see their note for official documentation of the patient encounter.   Subjective:   Patient ID: Amanda Santiago female   DOB: [birth year] 57 y.o.   MRN: [id number]  HPI: [name] is a 57 y.o.   Has noticed a bit of constipation since last night where she had the urge to go to the bathroom but could not; has not not noticed abno;rmal mucus, blood, or dark coloreds tool. Normally drinks 16*3 ounches of water  Benedryl Omeprazole Catopril  Supplement: ftx plus with arthritis Ozempic (109-110) 105 at last appt Vitamin d 2000 iu  Diabetes Polyuria no Polydipsia yes (has noticed salivary changes after tonsilectomy, July 2022) Polyphagia (has been eating more this week with company, but sometimes notices decreases in appetite with mood changes) Sugars Neuropathy (no) Vision ; red, blurry vision in left eye; tried to use a cream 1.5 months ago Exam? 4 anos; normal; did not need glasses  Vitamin d  Med rec?  Statin? Metformin  Cmp labs? Urinary sxs   Worried about effect of statin on liver  Abdominal pain with fatty foods   For the details of today's visit, please refer to the assessment and plan.   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    Fatty liver    Kidney stone 9-10 years ago   Kidney stone    Morbid obesity (HCC) 04/06/2014   BMI 50.6    Pain in joint, ankle and foot 04/06/2014   LEFT MRI 10/1015   Sternal pain 06/21/2014   Current Outpatient Medications  Medication Sig Dispense Refill   captopril (CAPOTEN) 25 MG tablet Take 25 mg by mouth 2 (two) times daily.     diclofenac sodium  (VOLTAREN) 1 % GEL Apply 2 g topically 4 (four) times daily. 100 g 2   dicyclomine (BENTYL) 20 MG tablet Take 1 tablet (20 mg total) by mouth every 12 (twelve) hours as needed (for abdominal pain/cramping). 20 tablet 0   diphenhydrAMINE (BENADRYL) 25 mg capsule Take 25 mg by mouth every 6 (six) hours as needed.     famotidine (PEPCID) 20 MG tablet Take 1 tablet (20 mg total) by mouth 2 (two) times daily. 30 tablet 0   lisinopril (PRINIVIL,ZESTRIL) 5 MG tablet Take 1 tablet (5 mg total) by mouth daily. (Patient not taking: Reported on 11/10/2017) 90 tablet 3   metFORMIN (GLUCOPHAGE-XR) 500 MG 24 hr tablet Take 1 tablet (500 mg total) by mouth daily with breakfast. (Patient not taking: Reported on 11/05/2022) 90 tablet 3   nitroGLYCERIN (NITRODUR - DOSED IN MG/24 HR) 0.2 mg/hr patch Cut patch into one - fourth pieces Place a one fourth piece of patch on  skin over affected area, changing to a new piece every 24 hours. (Patient not taking: Reported on 11/10/2017) 30 patch 1   NON FORMULARY Joint pain supplement from [place]. F.T.X Plus.     OZEMPIC, 1 MG/DOSE, 4 MG/3ML SOPN Inject 1 mg into the skin once a week.     pantoprazole (PROTONIX) 40 MG tablet Take 1 tablet (40 mg total) by mouth daily. (Patient not taking: Reported on 11/10/2017) 30 tablet  2   polyethylene glycol powder (GLYCOLAX/MIRALAX) powder Take 17 g by mouth daily. Until daily soft stools  OTC (Patient not taking: Reported on 11/10/2017) 119 g 0   pravastatin (PRAVACHOL) 20 MG tablet Take 1 tablet (20 mg total) by mouth at bedtime. (Patient not taking: Reported on 11/10/2017) 90 tablet 3   sucralfate (CARAFATE) 1 GM/10ML suspension Take 10 mLs (1 g total) by mouth 4 (four) times daily -  with meals and at bedtime. 420 mL 0   Vitamin D, Ergocalciferol, (DRISDOL) 50000 units CAPS capsule Take 1 capsule (50,000 Units total) by mouth every 7 (seven) days. 12 capsule 0   Current Facility-Administered Medications  Medication Dose Route Frequency  Provider Last Rate Last Admin   0.9 %  sodium chloride infusion  500 mL Intravenous Continuous Nandigam, Eleonore Chiquito, MD       Family History  Problem Relation Age of Onset   Diabetes Mother    Hyperlipidemia Mother    Hypertension Mother    Colon cancer Maternal Grandmother    Diabetes Sister    Hypertension Sister    Esophageal cancer Neg Hx    Rectal cancer Neg Hx    Stomach cancer Neg Hx    Social History   Socioeconomic History   Marital status: Single    Spouse name: Not on file   Number of children: Not on file   Years of education: Not on file   Highest education level: Not on file  Occupational History   Not on file  Tobacco Use   Smoking status: Former    Current packs/day: 0.00    Average packs/day: 1.5 packs/day for 18.0 years (27.0 ttl pk-yrs)    Types: Cigarettes    Start date: 02/10/1980    Quit date: 02/09/1998    Years since quitting: 24.7   Smokeless tobacco: Former    Quit date: 03/12/1997  Vaping Use   Vaping status: Never Used  Substance and Sexual Activity   Alcohol use: Yes    Comment: rare   Drug use: No   Sexual activity: Not on file  Other Topics Concern   Not on file  Social History Narrative   Not on file   Social Determinants of Health   Financial Resource Strain: Low Risk  (10/06/2020)   Received from [facility], [facility]   Overall Financial Resource Strain (CARDIA)    Difficulty of Paying Living Expenses: Not very hard  Food Insecurity: No Food Insecurity (10/06/2020)   Received from [facility], [facility]   Hunger Vital Sign    Worried About Running Out of Food in the Last Year: Never true    Ran Out of Food in the Last Year: Never true  Transportation Needs: No Transportation Needs (10/06/2020)   Received from [facility], [facility]   [facility] - Transportation    Lack of Transportation (Medical): No    Lack of Transportation (Non-Medical): No  Physical Activity: Not on file  Stress: Not on file   Social Connections: Not on file   Review of Systems: {Review Of Systems:30496} Objective:  Physical Exam: Vitals:   11/19/22 1327  BP: (!) 141/68  Pulse: 80  Temp: 98.1 F (36.7 C)  TempSrc: Oral  SpO2: 96%  Weight: (!) 339 lb 6.4 oz (154 kg)    Constitutional: NAD, appears comfortable HEENT: Atraumatic, normocephalic. PERRL, anicteric sclera.  Neck: Supple, trachea midline.  Cardiovascular: RRR, no murmurs, rubs, or gallops.  Pulmonary/Chest:  CTAB, no wheezes, rales, or rhonchi. No chest wall abnormalities.  Abdominal: Soft, non tender, non distended. +BS.  GU: ***  Extremities: Warm and well perfused. Distal pulses intact. No edema.  Neurological: A&Ox3, CN II - XII grossly intact.  Skin: No rashes or erythema  Psychiatric: Normal mood and affect  Assessment & Plan:   No problem-specific Assessment & Plan notes found for this encounter.

## 2022-11-20 LAB — CMP14 + ANION GAP
ALT: 38 IU/L — ABNORMAL HIGH (ref 0–32)
AST: 45 IU/L — ABNORMAL HIGH (ref 0–40)
Albumin: 4 g/dL (ref 3.8–4.9)
Alkaline Phosphatase: 162 IU/L — ABNORMAL HIGH (ref 44–121)
Anion Gap: 16 mmol/L (ref 10.0–18.0)
BUN/Creatinine Ratio: 21 (ref 9–23)
BUN: 12 mg/dL (ref 6–24)
Bilirubin Total: 0.4 mg/dL (ref 0.0–1.2)
CO2: 24 mmol/L (ref 20–29)
Calcium: 9.7 mg/dL (ref 8.7–10.2)
Chloride: 100 mmol/L (ref 96–106)
Creatinine, Ser: 0.57 mg/dL (ref 0.57–1.00)
Globulin, Total: 4.6 g/dL — ABNORMAL HIGH (ref 1.5–4.5)
Glucose: 89 mg/dL (ref 70–99)
Potassium: 4.1 mmol/L (ref 3.5–5.2)
Sodium: 140 mmol/L (ref 134–144)
Total Protein: 8.6 g/dL — ABNORMAL HIGH (ref 6.0–8.5)
eGFR: 105 mL/min/{1.73_m2} (ref >59)

## 2022-11-20 MED ORDER — ROSUVASTATIN CALCIUM 5 MG PO TABS: 5.0000 mg | ORAL_TABLET | Freq: Every day | ORAL | 11 refills | Status: DC

## 2022-11-20 MED ORDER — LISINOPRIL 10 MG PO TABS: 10.0000 mg | ORAL_TABLET | Freq: Every day | ORAL | 11 refills | Status: DC

## 2022-11-20 NOTE — Progress Notes (Signed)
Internal Medicine Clinic Attending  Case discussed with the resident at the time of the visit.  We reviewed the resident's history and exam and pertinent patient test results.  I agree with the assessment, diagnosis, and plan of care documented in the resident's note.  

## 2022-11-20 NOTE — Addendum Note (Signed)
Addended by: Modena Slater on: 11/20/2022 01:43 PM   Modules accepted: Orders

## 2022-11-22 ENCOUNTER — Telehealth: Payer: Self-pay

## 2022-11-22 NOTE — Telephone Encounter (Signed)
Submitted application for OZEMPIC to [employer] for patient assistance via online portal.   Phone: [phone]  

## 2022-11-22 NOTE — Telephone Encounter (Signed)
-----   Message from Modena Slater sent at 11/19/2022  7:23 PM EDT ----- Thank you for this, are you able to call the patient to discuss this with her? ----- Message ----- From: Otho Najjar, CPhT Sent: 11/19/2022   4:09 PM EDT To: Modena Slater, DO  Patient is uninsured. If she is diabetic she would qualify for patient assistance with [employer] for [employer]. ----- Message ----- From: Modena Slater, DO Sent: 11/19/2022   2:41 PM EDT To: Rx Med Assistance Team  Patient needs help to make Ozempic more affordable. She also has been approved for assistance for Trulicity in the past. I think either one or another in the same class would work. Could you help me to see which one you can get assistance for?

## 2022-11-29 NOTE — Telephone Encounter (Signed)
Received notification from [employer] regarding approval for OZEMPIC. Patient assistance approved from 11/29/22 to 11/24/23.  Medication will ship to [facility]  Pt ID: [id number]  Company phone: [phone]

## 2022-12-13 ENCOUNTER — Ambulatory Visit: Payer: Self-pay | Admitting: Hematology and Oncology

## 2022-12-13 ENCOUNTER — Ambulatory Visit
Admission: RE | Admit: 2022-12-13 | Discharge: 2022-12-13 | Disposition: A | Discharge status: Home / Self Care | Payer: No Typology Code available for payment source | Source: Ambulatory Visit

## 2022-12-13 VITALS — BP 120/79 | Wt 336.0 lb

## 2022-12-13 DIAGNOSIS — Z1231 Encounter for screening mammogram for malignant neoplasm of breast: Secondary | ICD-10-CM

## 2022-12-13 NOTE — Progress Notes (Signed)
Amanda Santiago is a 57 y.o. female who presents to [facility] clinic today with no complaints.    Pap Smear: Pap not smear completed today. Last Pap smear was 01/28/2018 and was normal. Per patient has no history of an abnormal Pap smear. Last Pap smear result is not available in Epic.   Physical exam: Breasts Breasts symmetrical. No skin abnormalities bilateral breasts. No nipple retraction bilateral breasts. No nipple discharge bilateral breasts. No lymphadenopathy. No lumps palpated bilateral breasts.        Pelvic/Bimanual Pap is not indicated today    Smoking History: Patient has is a former smoker and was not referred to quit line.    Patient Navigation: Patient education provided. Access to services provided for patient through B[facility]CP program. Amanda Santiago interpreter provided. No transportation provided   Colorectal Cancer Screening: Per patient had colonoscopy in 06/13/2020 with benign results and will follow 10 years after.  No complaints today.    Breast and Cervical Cancer Risk Assessment: Patient does not have family history of breast cancer, known genetic mutations, or radiation treatment to the chest before age 70. Patient does not have history of cervical dysplasia, immunocompromised, or DES exposure in-utero.  Risk Scores as of Encounter on 12/13/2022     Amanda Santiago           5-year 2.02%   Lifetime 11.42%   This patient is [place] but has no documented birth country, so the [place] model used data from [place] patients to calculate their risk score. Document a birth country in the Demographics activity for a more accurate score.         Last calculated by Amanda Santiago, CMA on 12/13/2022 at  3:51 PM        A: B[facility]CP exam without pap smear No complaints with benign exam.   P: Referred patient to the [facility] for a screening mammogram. Appointment scheduled 12/13/2022.  Pascal Lux, NP 12/13/2022 4:02 PM

## 2022-12-13 NOTE — Patient Instructions (Signed)
Taught Amanda Santiago about self breast awareness and gave educational materials to take home. Patient did not need a Pap smear today due to last Pap smear was in 01/20/2018 per patient.  Let her know [facility]CP will cover Pap smears every 5 years unless has a history of abnormal Pap smears. Referred patient to the [facility] for screening mammogram. Appointment scheduled for 12/13/2022. Patient aware of appointment and will be there. Let patient know will follow up with her within the next couple weeks with results. Amanda Santiago verbalized understanding.  Pascal Lux, NP 4:05 PM

## 2022-12-19 NOTE — Telephone Encounter (Signed)
Informed patient her novo nordisk shipment is ready for pickup. Says her son may come pickup.  4 boxes of ozempic 1mg  dose pens are labeled and ready in med room fridge.

## 2022-12-20 NOTE — Telephone Encounter (Signed)
Pt's son, Lubertha Sayres, pick-up 4 boxes of Ozempic for the pt.

## 2023-02-18 ENCOUNTER — Encounter: Payer: Self-pay | Admitting: Student

## 2023-02-28 ENCOUNTER — Encounter: Payer: No Typology Code available for payment source | Admitting: Student

## 2023-03-20 ENCOUNTER — Other Ambulatory Visit: Payer: Self-pay

## 2023-03-20 ENCOUNTER — Ambulatory Visit: Payer: Self-pay | Admitting: Student

## 2023-03-20 ENCOUNTER — Encounter: Payer: Self-pay | Admitting: Student

## 2023-03-20 VITALS — BP 115/73 | HR 76 | Temp 98.0°F | Ht 67.0 in | Wt 352.4 lb

## 2023-03-20 DIAGNOSIS — E669 Obesity, unspecified: Secondary | ICD-10-CM

## 2023-03-20 DIAGNOSIS — I10 Essential (primary) hypertension: Secondary | ICD-10-CM

## 2023-03-20 DIAGNOSIS — E119 Type 2 diabetes mellitus without complications: Secondary | ICD-10-CM

## 2023-03-20 DIAGNOSIS — Z7984 Long term (current) use of oral hypoglycemic drugs: Secondary | ICD-10-CM

## 2023-03-20 DIAGNOSIS — K760 Fatty (change of) liver, not elsewhere classified: Secondary | ICD-10-CM

## 2023-03-20 DIAGNOSIS — Z6841 Body Mass Index (BMI) 40.0 and over, adult: Secondary | ICD-10-CM

## 2023-03-20 DIAGNOSIS — Z23 Encounter for immunization: Secondary | ICD-10-CM

## 2023-03-20 DIAGNOSIS — Z7985 Long-term (current) use of injectable non-insulin antidiabetic drugs: Secondary | ICD-10-CM

## 2023-03-20 LAB — POCT GLYCOSYLATED HEMOGLOBIN (HGB A1C): Hemoglobin A1C: 7.8 % — AB (ref 4.0–5.6)

## 2023-03-20 LAB — GLUCOSE, CAPILLARY: Glucose-Capillary: 151 mg/dL — ABNORMAL HIGH (ref 70–99)

## 2023-03-20 MED ORDER — SEMAGLUTIDE(0.25 OR 0.5MG/DOS) 2 MG/3ML ~~LOC~~ SOPN: 1.0000 mg | PEN_INJECTOR | SUBCUTANEOUS | Status: DC

## 2023-03-20 MED ORDER — METFORMIN HCL ER (OSM) 500 MG PO TB24: 500.0000 mg | ORAL_TABLET | Freq: Every day | ORAL | 0 refills | Status: DC

## 2023-03-20 NOTE — Assessment & Plan Note (Addendum)
 Most recent lipid enzymes much improved AST 45 and ALT 38.  Plan: -monitor

## 2023-03-20 NOTE — Patient Instructions (Addendum)
 Thank you, [name] for allowing us  to provide your care today.   Today we discussed :  1.  For diabetes, your A1c is high today.  Please continue to eat healthy diet, and Ozempic  1 mg weekly.  I have also started you on metformin  500 mg.  I have placed a referral to see an eye doctor, and a nutritionist here in our clinic.   2.  Your blood pressure is well-controlled.  Please continue lisinopril  10 mg.  3.  I will call you with the results of your urine labs.  I have ordered the following labs for you:   Lab Orders         Glucose, capillary         Microalbumin / Creatinine Urine Ratio         POC Hbg A1C        Referrals ordered today:    Referral Orders         Ambulatory referral to Ophthalmology         Referral to Nutrition and Diabetes Services      I have ordered the following medication/changed the following medications:   Stop the following medications: There are no discontinued medications.   Start the following medications: No orders of the defined types were placed in this encounter.    Follow up: 3 months     Should you have any questions or concerns please call the internal medicine clinic at [phone].    Amanda Sandhoff, MD  [facility]

## 2023-03-20 NOTE — Assessment & Plan Note (Signed)
 A1c today 6.2>>> 7.8.  Patient attributes the increase in A1c to not eating right.  She is able to get Ozempic  through the [facility] assistance program, endorses compliance.  Patient was previously on metformin , was stopped in the setting of well-controlled diabetes.  Denies any history of GI upset with metformin . Will restart patient on metformin  due to elevated A1c.  Could also consider SGLT2, however patient does not have insurance and would need to apply for manufactures assistance. Plan -Urine microalbumin creatinine ratio. -Continue Ozempic  1 mg weekly injection. -Start metformin  500 mg once a day. -Continue rosuvastatin  5 mg. -Referral for diabetic eye exam placed. -Referral to nutritionist placed.

## 2023-03-20 NOTE — Assessment & Plan Note (Signed)
 BP stable.  Will continue on lisinopril 10 mg.

## 2023-03-20 NOTE — Progress Notes (Addendum)
 CC: 3 months fu  HPI:  [name] is a 58 y.o. female living with a history stated below and presents today for 57-month follow-up on hypertension and diabetes.  Please see problem based assessment and plan for additional details.  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    Fatty liver    Kidney stone 9-10 years ago   Kidney stone    Morbid obesity (HCC) 04/06/2014   BMI 50.6    Pain in joint, ankle and foot 04/06/2014   LEFT MRI 10/1015   Sternal pain 06/21/2014    Current Outpatient Medications on File Prior to Visit  Medication Sig Dispense Refill   Blood Glucose Monitoring Suppl (GLUCOCOM BLOOD GLUCOSE MONITOR) DEVI Check blood sugars as directed by provider.     diclofenac  sodium (VOLTAREN ) 1 % GEL Apply 2 g topically 4 (four) times daily. 100 g 2   dicyclomine  (BENTYL ) 20 MG tablet Take 1 tablet (20 mg total) by mouth every 12 (twelve) hours as needed (for abdominal pain/cramping). (Patient not taking: Reported on 12/13/2022) 20 tablet 0   diphenhydrAMINE (BENADRYL) 25 mg capsule Take 25 mg by mouth every 6 (six) hours as needed.     famotidine  (PEPCID ) 20 MG tablet Take 1 tablet (20 mg total) by mouth 2 (two) times daily. (Patient not taking: Reported on 12/13/2022) 30 tablet 0   fluticasone  (FLONASE ) 50 MCG/ACT nasal spray 1 spray into each nostril daily.     glucose blood (ON CALL EXPRESS BLOOD GLUCOSE) test strip Test blood sugar once daily     Lancet Devices (SIMPLE DIAGNOSTICS LANCING DEV) MISC Use to test blood sugar once daily     lisinopril  (ZESTRIL ) 10 MG tablet Take 1 tablet (10 mg total) by mouth daily. 30 tablet 11   NON FORMULARY Joint pain supplement from [place]. F.T.X Plus.     omeprazole (PRILOSEC) 10 MG capsule Take 10 mg by mouth daily.      rosuvastatin  (CRESTOR ) 5 MG tablet Take 1 tablet (5 mg total) by mouth daily. 30 tablet 11   Safety Let Lancets MISC Use to test blood sugar once daily     Vitamin D , Ergocalciferol , (DRISDOL ) 50000 units CAPS capsule Take 1 capsule (50,000 Units total) by mouth every 7 (seven) days. 12 capsule 0   Current Facility-Administered Medications on File Prior to Visit  Medication Dose Route Frequency Provider Last Rate Last Admin   0.9 %  sodium chloride  infusion  500 mL Intravenous Continuous Nandigam, Kavitha V, MD         Review of Systems: ROS negative except for what is noted on the assessment and plan.  Vitals:   03/20/23 0904 03/20/23 0914  BP: (!) 146/86 115/73  Pulse: 73 76  Temp: 98 F (36.7 C)   TempSrc: Oral   SpO2: 92%   Weight: (!) 352 lb 6.4 oz (159.8 kg)   Height: 5' 7 (1.702 m)     Physical Exam: Constitutional: Well appearing, obese woman, not in acute distress. Cardiovascular: regular rate and rhythm, no m/r/g Pulmonary/Chest: normal work of breathing on room air, lungs clear to auscultation bilaterally   Assessment & Plan:   Diabetes mellitus without complication (HCC) A1c today 6.2>>> 7.8.  Patient attributes the increase in A1c to not eating right.  She  is able to get Ozempic  through the [facility] assistance program, endorses compliance.  Patient was previously on metformin , was stopped in the setting of well-controlled diabetes.  Denies any history of GI upset with metformin . Will restart patient on metformin  due to elevated A1c.  Could also consider SGLT2, however patient does not have insurance and would need to apply for manufactures assistance. Plan -Urine microalbumin creatinine ratio. -Continue Ozempic  1 mg weekly injection. -Start metformin  500 mg once a day. -Continue rosuvastatin  5 mg. -Referral for diabetic eye exam placed. -Referral to nutritionist placed.  Hypertension BP stable.  Will continue on lisinopril  10 mg.  Hepatic steatosis   Most recent lipid enzymes much improved AST 45 and ALT 38.  Plan: -monitor    Health maintenance - Flu shots received.  Patient discussed with Dr. Guilloud   Amanda Perko, MD [facility] Internal Medicine, PGY-1 Phone: [phone] Date 03/20/2023 Time 10:25 AM

## 2023-03-21 LAB — MICROALBUMIN / CREATININE URINE RATIO
Creatinine, Urine: 61.8 mg/dL
Microalb/Creat Ratio: <5 mg/g{creat} (ref 0–29)
Microalbumin, Urine: <3 ug/mL

## 2023-03-22 NOTE — Progress Notes (Signed)
 Internal Medicine Clinic Attending  Case discussed with the resident at the time of the visit.  We reviewed the resident's history and exam and pertinent patient test results.  I agree with the assessment, diagnosis, and plan of care documented in the resident's note.

## 2023-03-22 NOTE — Addendum Note (Signed)
 Addended by: Reymundo Poll R on: 03/22/2023 09:00 AM   Modules accepted: Level of Service

## 2023-03-22 NOTE — Progress Notes (Signed)
 Normal UACR.  Patient notified.

## 2023-04-03 ENCOUNTER — Telehealth: Payer: Self-pay

## 2023-04-03 NOTE — Telephone Encounter (Signed)
Spoke to patients son regarding novo nordisk med pickup.   4 boxes of ozempic 1mg  dose pens are labeled and ready in med room fridge.

## 2023-07-03 ENCOUNTER — Telehealth: Payer: Self-pay

## 2023-07-03 NOTE — Telephone Encounter (Signed)
 Informed patients son that patients novo nordisk shipment is ready for pickup.   4 boxes of Ozempic  1mg  dose pens are labeled and ready in med room fridge.

## 2023-07-09 ENCOUNTER — Other Ambulatory Visit (HOSPITAL_COMMUNITY): Payer: Self-pay

## 2023-07-09 NOTE — Telephone Encounter (Signed)
 Pt's daughter picked up 4 boxes of Ozempic .

## 2023-07-17 ENCOUNTER — Telehealth: Payer: Self-pay

## 2023-07-17 NOTE — Telephone Encounter (Signed)
 Health Coaching 3  interpreter- [facility]   Patient has been taking medication for her cholesterol, blood sugar and blood pressure. Patient followed up with her PCP a few months ago for her diabetes. Her A1C was still elevated during that visit. Patient stated that her diet had not been good prior to that visit. Her PCP decided to start her on an additional medication for her blood sugar. Encouraged patient to continue watching her diet.  -Watching the amount of sweet and sugary foods and drink. -Watching the amount of carb rich foods consumed.  -Some form of daily exercise for 20-30 minutes.  Navigation:  Patient is aware of  a follow up session. Patient is scheduled for FU on 08/16/23.

## 2023-08-16 ENCOUNTER — Inpatient Hospital Stay

## 2023-08-27 NOTE — Progress Notes (Signed)
 Wisewoman follow up   Interpreter: Herma Longest, [employer]  Clinical Measurement:   Vitals:   08/28/23 1205 08/28/23 1537  BP: 135/80 128/78      Medical History: Patient states that she does not have high cholesterol, has high blood pressure and she has diabetes. Patient states that she does not have history of gestational hypertension, does not have history of pre-eclampsia/eclampsia and she does not have history of gestational diabetes.     Medications: Patient states that she does take medication to lower cholesterol, blood pressure and blood sugar.  Patient does not take an aspirin a day to help prevent a heart attack or stroke. During the past 7 days patient has taken prescribed medication to lower blood pressure and blood sugar on 7 days.   Blood pressure, self measurement: Patient states that she does measure blood pressure from home. She checks her blood pressure monthly. She shares her readings with a health care provider: no.   Nutrition: Patient states that on average she eats 2 cups of fruit and 1 cups of vegetables per day. Patient states that she does eat fish at least 2 times per week. Patient eats less than half servings of whole grains. Patient drinks less than 36 ounces of beverages with added sugar weekly: yes. Patient is currently watching sodium or salt intake: yes. In the past 7 days patient has had 0 drinks containing alcohol. On average patient drinks 0 drinks containing alcohol per day.      Physical activity: Patient states that she gets 0 minutes of moderate and 0 minutes of vigorous physical activity each week.  Smoking status: Patient states that she has has never smoked .   Quality of life: Over the past 2 weeks patient states that she had little interest or pleasure in doing things: several days. She has been feeling down, depressed or hopeless:several days.   Social Determinants of Health Assessment:   Computer Use: During the last 12 months patient states  that she has used any of the following: desktop/laptop, smart phone or tablet/other portable wireless computer: yes.   Internet Use: During the last 12 months, did you or any member of your household have access to the internet: Yes, by paying a cell phone company or internet service provider.   Food Insecurities: During the last 12 months, where there any times when you were worried that you would run out of food because of a lack of money or other resources: No.   Transportation Barriers: During the last 12 months, have you missed a doctor's appointment because of transportation problems: No.   Childcare Barriers: If you are currently using childcare services, please identify  the type of services you use. (If not using childcare services, please select Not applicable): not applicable. During the last 12 months, have you had any barriers to childcare services such as: not applicable.   Housing: What is your housing situation today: I have housing.   Intimate Partner Violence: During the last 12 months, how often did your partner physically hurt you: never. During the last 12 months, how often did your partner insult you or talk down to you: never.  Medication Adherence: During the last 12 months, did you ever forget to take your medicine: No. During the last 12 months, were you careless ar times about taking your medicine: No. During the last 12 months, when you felt better did you sometimes stop taking your medication: No. During the last 12 months, sometimes if you felt worse  when you took your medicine did you stop taking it: No.    Risk reduction and counseling: Spoke with patient about adding more vegetables into daily diet. Patient does not consume whole grains often. Gave suggestions for ones that she can add into diet. Patient has not been exercising due to pain she has been experiencing. Spoke with patient about possibly trying an exercise to reduce impact on joints.     Navigation:  This was the  follow up session for this patient, I will check up on her progress in the coming months. Referred patient for counseling services through [id number].

## 2023-08-28 ENCOUNTER — Inpatient Hospital Stay: Attending: Obstetrics and Gynecology | Admitting: *Deleted

## 2023-08-28 VITALS — BP 128/78 | Ht 67.0 in | Wt 360.0 lb

## 2023-08-28 DIAGNOSIS — Z0000 Encounter for general adult medical examination without abnormal findings: Secondary | ICD-10-CM

## 2023-09-12 ENCOUNTER — Other Ambulatory Visit: Payer: Self-pay

## 2023-09-12 ENCOUNTER — Ambulatory Visit: Payer: Self-pay

## 2023-09-12 ENCOUNTER — Ambulatory Visit (INDEPENDENT_AMBULATORY_CARE_PROVIDER_SITE_OTHER): Payer: Self-pay

## 2023-09-12 VITALS — HR 74 | Temp 97.2°F | Ht 67.0 in | Wt 363.2 lb

## 2023-09-12 DIAGNOSIS — M25561 Pain in right knee: Secondary | ICD-10-CM

## 2023-09-12 DIAGNOSIS — M25562 Pain in left knee: Secondary | ICD-10-CM

## 2023-09-12 DIAGNOSIS — R040 Epistaxis: Secondary | ICD-10-CM

## 2023-09-12 DIAGNOSIS — I10 Essential (primary) hypertension: Secondary | ICD-10-CM

## 2023-09-12 DIAGNOSIS — Z7984 Long term (current) use of oral hypoglycemic drugs: Secondary | ICD-10-CM

## 2023-09-12 DIAGNOSIS — E119 Type 2 diabetes mellitus without complications: Secondary | ICD-10-CM

## 2023-09-12 DIAGNOSIS — Z7985 Long-term (current) use of injectable non-insulin antidiabetic drugs: Secondary | ICD-10-CM

## 2023-09-12 LAB — POCT GLYCOSYLATED HEMOGLOBIN (HGB A1C): Hemoglobin A1C: 8 % — AB (ref 4.0–5.6)

## 2023-09-12 LAB — GLUCOSE, CAPILLARY: Glucose-Capillary: 133 mg/dL — ABNORMAL HIGH (ref 70–99)

## 2023-09-12 MED ORDER — ACETAMINOPHEN 500 MG PO TABS: 500.0000 mg | ORAL_TABLET | Freq: Three times a day (TID) | ORAL | 0 refills | Status: DC | PRN

## 2023-09-12 MED ORDER — SEMAGLUTIDE(0.25 OR 0.5MG/DOS) 2 MG/3ML ~~LOC~~ SOPN: 2.0000 mg | PEN_INJECTOR | SUBCUTANEOUS | 4 refills | Status: DC

## 2023-09-12 MED ORDER — SEMAGLUTIDE(0.25 OR 0.5MG/DOS) 2 MG/3ML ~~LOC~~ SOPN
2.0000 mg | PEN_INJECTOR | SUBCUTANEOUS | 4 refills | Status: DC
  Filled 2023-09-12: qty 3, fill #0

## 2023-09-12 NOTE — Assessment & Plan Note (Addendum)
 The patient has a history of morbid obesity and longstanding knee pain, which has recently worsened. She has previously tried pain medications and received a steroid injection, but reports minimal relief and has since stopped treatment.   She describes pain behind the knee with swelling and a sensation of fluid accumulation. On exam, the knee appears edematous with evidence of patellar displacement.  Assess:  Severe Osteoarthritis   Plan: Start Tylenol  to relieve pain and increase the dose of Ozempic  to 2 mg weekly to promote weight loss and help reduce pressure on the knees.

## 2023-09-12 NOTE — Patient Instructions (Addendum)
 Gracias, Sra. Amanda Santiago, por permitirnos brindarle atencin hoy. Discutimos sus hemorragias nasales y le recomendamos aplicar vaselina suavemente en sus fosas nasales dos veces al da. Tambin enviamos una referencia a Otorrinolaringologa para un examen nasal completo.  Con respecto a su dolor de rodilla, le iniciamos un medicamento para Chief Technology Officer y aumentamos la dosis de Ozempic  para apoyar la prdida de [place], lo que ayudar a reducir la presin sobre sus rodillas.  Fue un Engineer, manufacturing systems. Por favor, no dude en comunicarse si tiene alguna pregunta.  He ordenado los siguientes estudios de laboratorio para usted:  rdenes de laboratorio  Glucosa capilar  BMP8 + Brecha aninica  Hemograma completo sin diferencial  BMP8 + Brecha aninica  HbA1c POC (prueba rpida)  HbA1c POC (prueba rpida)  Referencias ordenadas hoy:  rdenes de referencia  Referencia ambulatoria a Editor, commissioning (ENT)  He ordenado los siguientes medicamentos / cambiado los siguientes medicamentos:  Medicamentos suspendidos durante esta consulta:  Diclofenaco sdico (VOLTAREN ) gel al 1 % -- Curso completado  Dicyclomina (BENTYL ) 20 mg tabletas -- Curso completado  OZEMPIC , 1 mg/dosis, 4 mg/3 ml SOPN -- Prescripcin vencida  Semaglutida, 0.25 o 0.5 mg/dosis, 2 mg/3 ml SOPN  Medicamentos iniciados en esta consulta:  Semaglutida, 0.25 o 0.5 mg/dosis, 2 mg/3 ml SOPN Indicaciones: Inyectar 2 mg por va subcutnea una vez a la semana. Dispensar: 3 ml Repeticiones: 4  Acetaminofn (TYLENOL ) 500 mg tabletas Indicaciones: Tomar 1 tableta (500 mg) por va oral cada 8 horas segn sea necesario. Dispensar: 100 tabletas Repeticiones: 0  Seguimiento: 1 mes  Recuerde:  Si tiene alguna pregunta o inquietud, por favor llame a la clnica de medicina interna al [phone].  [facility]

## 2023-09-12 NOTE — Progress Notes (Signed)
 CC: Epistaxis, Bilateral Knee Pain  Amanda Santiago is a 58 y.o. female living with a history stated below and presents today for epistaxis, bilateral knee pain and follow up on her chronic condition. Please see problem based assessment and plan for additional details.  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    Fatty liver    Kidney stone 9-10 years ago   Kidney stone    Morbid obesity (HCC) 04/06/2014   BMI 50.6    Pain in joint, ankle and foot 04/06/2014   LEFT MRI 10/1015   Sternal pain 06/21/2014    Current Outpatient Medications on File Prior to Visit  Medication Sig Dispense Refill   Blood Glucose Monitoring Suppl (GLUCOCOM BLOOD GLUCOSE MONITOR) DEVI Check blood sugars as directed by provider.     diphenhydrAMINE (BENADRYL) 25 mg capsule Take 25 mg by mouth every 6 (six) hours as needed.     famotidine  (PEPCID ) 20 MG tablet Take 1 tablet (20 mg total) by mouth 2 (two) times daily. (Patient not taking: Reported on 08/28/2023) 30 tablet 0   fluticasone  (FLONASE ) 50 MCG/ACT nasal spray 1 spray into each nostril daily. (Patient not taking: Reported on 08/28/2023)     glucose blood (ON CALL EXPRESS BLOOD GLUCOSE) test strip Test blood sugar once daily     Lancet Devices (SIMPLE DIAGNOSTICS LANCING DEV) MISC Use to test blood sugar once daily     lisinopril  (ZESTRIL ) 10 MG tablet Take 1 tablet (10 mg total) by mouth daily. 30 tablet 11   metformin  (FORTAMET ) 500 MG (OSM) 24 hr tablet Take 1 tablet (500 mg total) by mouth daily with breakfast. 90 tablet 0   NON FORMULARY Joint pain supplement from [place]. F.T.X Plus.     omeprazole (PRILOSEC) 10 MG capsule Take 10 mg by mouth daily.     rosuvastatin  (CRESTOR ) 5 MG tablet Take 1 tablet (5 mg total) by mouth daily. (Patient  not taking: Reported on 08/28/2023) 30 tablet 11   Safety Let Lancets MISC Use to test blood sugar once daily     Vitamin D , Ergocalciferol , (DRISDOL ) 50000 units CAPS capsule Take 1 capsule (50,000 Units total) by mouth every 7 (seven) days. (Patient not taking: Reported on 08/28/2023) 12 capsule 0   Current Facility-Administered Medications on File Prior to Visit  Medication Dose Route Frequency Provider Last Rate Last Admin   0.9 %  sodium chloride  infusion  500 mL Intravenous Continuous Nandigam, Kavitha V, MD        Family History  Problem Relation Age of Onset   Diabetes Mother    Hyperlipidemia Mother    Hypertension Mother    Diabetes Sister    Hypertension Sister    Colon cancer Maternal Grandmother    Esophageal cancer Neg Hx    Rectal cancer Neg Hx    Stomach cancer Neg Hx    Breast cancer Neg Hx     Social History   Socioeconomic History   Marital status: Single    Spouse name: Not on file   Number of children: 3   Years of education: Not on file   Highest education level: Not on file  Occupational History   Not on file  Tobacco Use   Smoking status: Former    Current packs/day: 0.00  Average packs/day: 1.5 packs/day for 18.0 years (27.0 ttl pk-yrs)    Types: Cigarettes    Start date: 02/10/1980    Quit date: 02/09/1998    Years since quitting: 25.6   Smokeless tobacco: Former    Quit date: 03/12/1997  Vaping Use   Vaping status: Never Used  Substance and Sexual Activity   Alcohol use: Yes    Comment: rare   Drug use: No   Sexual activity: Not on file  Other Topics Concern   Not on file  Social History Narrative   Not on file   Social Drivers of Health   Financial Resource Strain: Low Risk  (10/06/2020)   Received from [facility]   Overall Financial Resource Strain (CARDIA)    Difficulty of Paying Living Expenses: Not very hard  Food Insecurity: No Food Insecurity (09/12/2023)   Hunger Vital Sign    Worried About Running Out of Food in the Last  Year: Never true    Ran Out of Food in the Last Year: Never true  Transportation Needs: No Transportation Needs (12/13/2022)   PRAPARE - Administrator, Civil Service (Medical): No    Lack of Transportation (Non-Medical): No  Physical Activity: Not on file  Stress: Not on file  Social Connections: Not on file  Intimate Partner Violence: Not on file    Review of Systems: ROS negative except for what is noted on the assessment and plan.  Vitals:   09/12/23 1028  Pulse: 74  Temp: (!) 97.2 F (36.2 C)  TempSrc: Oral  SpO2: 93%  Weight: (!) 363 lb 3.2 oz (164.7 kg)  Height: 5' 7 (1.702 m)    Physical Exam  Physical Exam: Constitutional: well-appearing, in no acute distress Cardiovascular: regular rate and rhythm, no m/r/g Pulmonary/Chest: normal work of breathing on room air, lungs clear to auscultation bilateral MSK: Both knees were edematous, limited ROM, the patella was displaced Skin: no bruising or petechia  were observed   Assessment & Plan:   Diabetes mellitus without complication (HCC) She is a known case of type 2 diabetes mellitus, currently managed with metformina and Ozempic . She reports generally well controlled blood sugars at home, with an average around 150  Today's reading was 133 . She denies any symptoms of hypoglycemia, numbness, or foot ulcers. On examinatin, heart and lung sounds are clear. No foot ulcers are present, and there are no signs of peripheral neuropathy such as numbness or tenderness. Plan: increase the dose of Ozempic  to 2 mg weekly to promote weight loss. HbA1c has been ordered.Lifestyle counseling was reinforced, and she is advised to continue regular blood sugar monitoring.    Knee pain, bilateral The patient has a history of morbid obesity and longstanding knee pain, which has recently worsened. She has previously tried pain medications and received a steroid injection, but reports minimal relief and has since stopped  treatment.   She describes pain behind the knee with swelling and a sensation of fluid accumulation. On exam, the knee appears edematous with evidence of patellar displacement.  Assess:  Severe Osteoarthritis   Plan: Start Tylenol  to relieve pain and increase the dose of Ozempic  to 2 mg weekly to promote weight loss and help reduce pressure on the knees.     Epistaxis The patient reports experiencing nosebleeds for the past six months, occurring daily and always from the right nostril. Each episode lasts about 10 minutes before stopping. She denies any associated symptoms such as headache or other  systemic complaints.  On physical exam, the anterior portion of the right nostril was inspected and showed no ulcers, lesions, or other abnormalities. The throat exam was normal. She has a history of tonsillectomy two years ago. No bruising, petechiae, or other signs of bleeding disorder were noted on exam.  The most likely cause is dry nasal mucosa or a lesion in the posterior nasal passage, given the isolated right-sided bleeding and lack of systemic signs. Hypertension is considered less likely as a cause since her blood pressure is currently well controlled. Coagulopathy or thrombocytopenia is also considered unlikely given the absence of bruising or other bleeding symptoms.  Plan is to refer to ENT for further evaluation, and order lab work including CBC to rule out any underlying hematologic causes.   Patient seen with Dr. Jeanelle Armando Rossetti M.D [facility] Internal Medicine, PGY-1 Phone: [phone] Date 09/12/2023 Time 1:54 PM

## 2023-09-12 NOTE — Progress Notes (Signed)
 Hb A1C is 8, the last one was 7.8. Ozempic  dose has been increased to 2 mg weekly.

## 2023-09-12 NOTE — Assessment & Plan Note (Addendum)
 She is a known case of type 2 diabetes mellitus, currently managed with metformina and Ozempic . She reports generally well controlled blood sugars at home, with an average around 150  Today's reading was 133 . She denies any symptoms of hypoglycemia, numbness, or foot ulcers. On examinatin, heart and lung sounds are clear. No foot ulcers are present, and there are no signs of peripheral neuropathy such as numbness or tenderness. Plan: increase the dose of Ozempic  to 2 mg weekly to promote weight loss. HbA1c has been ordered.Lifestyle counseling was reinforced, and she is advised to continue regular blood sugar monitoring.

## 2023-09-12 NOTE — Assessment & Plan Note (Addendum)
 The patient reports experiencing nosebleeds for the past six months, occurring daily and always from the right nostril. Each episode lasts about 10 minutes before stopping. She denies any associated symptoms such as headache or other systemic complaints.  On physical exam, the anterior portion of the right nostril was inspected and showed no ulcers, lesions, or other abnormalities. The throat exam was normal. She has a history of tonsillectomy two years ago. No bruising, petechiae, or other signs of bleeding disorder were noted on exam.  The most likely cause is dry nasal mucosa or a lesion in the posterior nasal passage, given the isolated right-sided bleeding and lack of systemic signs. Hypertension is considered less likely as a cause since her blood pressure is currently well controlled. Coagulopathy or thrombocytopenia is also considered unlikely given the absence of bruising or other bleeding symptoms.  Plan is to refer to ENT for further evaluation, and order lab work including CBC to rule out any underlying hematologic causes.

## 2023-09-13 LAB — BMP8+ANION GAP
Anion Gap: 22 mmol/L — ABNORMAL HIGH (ref 10.0–18.0)
BUN/Creatinine Ratio: 35 — ABNORMAL HIGH (ref 9–23)
BUN: 17 mg/dL (ref 6–24)
CO2: 17 mmol/L — ABNORMAL LOW (ref 20–29)
Calcium: 9.4 mg/dL (ref 8.7–10.2)
Chloride: 102 mmol/L (ref 96–106)
Creatinine, Ser: 0.48 mg/dL — ABNORMAL LOW (ref 0.57–1.00)
Glucose: 128 mg/dL — ABNORMAL HIGH (ref 70–99)
Potassium: 4.5 mmol/L (ref 3.5–5.2)
Sodium: 141 mmol/L (ref 134–144)
eGFR: 109 mL/min/1.73 (ref >59)

## 2023-09-13 LAB — CBC
Hematocrit: 43.7 % (ref 34.0–46.6)
Hemoglobin: 14 g/dL (ref 11.1–15.9)
MCH: 29.7 pg (ref 26.6–33.0)
MCHC: 32 g/dL (ref 31.5–35.7)
MCV: 93 fL (ref 79–97)
Platelets: 197 x10E3/uL (ref 150–450)
RBC: 4.71 x10E6/uL (ref 3.77–5.28)
RDW: 13.9 % (ref 11.7–15.4)
WBC: 9.4 x10E3/uL (ref 3.4–10.8)

## 2023-09-16 ENCOUNTER — Telehealth: Payer: Self-pay | Admitting: *Deleted

## 2023-09-16 ENCOUNTER — Other Ambulatory Visit: Payer: Self-pay | Admitting: Internal Medicine

## 2023-09-16 NOTE — Telephone Encounter (Signed)
 Have attempted to call pharmacy x 5 each time disconnected in transfer.  Copied from CRM [id number]. Topic: Clinical - Prescription Issue >> Sep 16, 2023  2:19 PM Laurier C wrote: Reason for CRM: Cody from [place] pharmacy is requesting a call back at [phone] to get clarification on the following medication: Semaglutide ,0.25 or 0.5MG /DOS, 2 MG/3ML SOPN

## 2023-09-16 NOTE — Telephone Encounter (Unsigned)
 Copied from CRM [id number]. Topic: Clinical - Prescription Issue >> Sep 16, 2023  2:19 PM Laurier C wrote: Reason for CRM: Cody from [place] [facility] is requesting a call back at [phone] to get clarification on the following medication: Semaglutide ,0.25 or 0.5MG /DOS, 2 MG/3ML SOPN

## 2023-09-17 NOTE — Progress Notes (Signed)
 I asked a  interpretor to call pt and let her know lab result and let her know to drink good amount of water, avoid soda and sugar, let her know red flags like N/V, confusion and come back in 2 weeks to repeat her lab.

## 2023-09-17 NOTE — Progress Notes (Signed)
 Internal Medicine Clinic Attending  I was physically present during the key portions of the resident provided service and participated in the medical decision making of patient's management care. I reviewed pertinent patient test results.  The assessment, diagnosis, and plan were formulated together and I agree with the documentation in the resident's note.  Carney Living, MD

## 2023-09-23 ENCOUNTER — Telehealth: Payer: Self-pay | Admitting: *Deleted

## 2023-09-23 NOTE — Telephone Encounter (Signed)
 Source  Amanda Santiago (Patient)   Subject  Amanda Santiago (Patient)   Topic  Clinical - Prescription Issue    Communication  Reason for CRM: Alan, with [facility], called in stating they are needing a new prescription for the correct dosage pen. She states they are needing the 8MG /2MG  pen so that the patient can get the 2mg  dose in. For further questions or concerns, please contact the pharmacy at [phone].

## 2023-09-25 ENCOUNTER — Other Ambulatory Visit: Payer: Self-pay

## 2023-09-25 MED ORDER — OZEMPIC (2 MG/DOSE) 8 MG/3ML ~~LOC~~ SOPN
2.0000 mg | PEN_INJECTOR | SUBCUTANEOUS | 6 refills | Status: DC
Start: 2023-09-25 — End: 2023-11-03

## 2023-10-01 ENCOUNTER — Other Ambulatory Visit: Payer: Self-pay

## 2023-10-01 DIAGNOSIS — I10 Essential (primary) hypertension: Secondary | ICD-10-CM

## 2023-10-01 NOTE — Addendum Note (Signed)
 Addended by: ANTONE DWAYNE SAILOR on: 10/01/2023 09:58 AM   Modules accepted: Orders

## 2023-10-02 ENCOUNTER — Ambulatory Visit: Payer: Self-pay

## 2023-10-02 LAB — BASIC METABOLIC PANEL WITH GFR
BUN/Creatinine Ratio: 31 — ABNORMAL HIGH (ref 9–23)
BUN: 15 mg/dL (ref 6–24)
CO2: 20 mmol/L (ref 20–29)
Calcium: 8.9 mg/dL (ref 8.7–10.2)
Chloride: 102 mmol/L (ref 96–106)
Creatinine, Ser: 0.49 mg/dL — ABNORMAL LOW (ref 0.57–1.00)
Glucose: 112 mg/dL — ABNORMAL HIGH (ref 70–99)
Potassium: 4.3 mmol/L (ref 3.5–5.2)
Sodium: 136 mmol/L (ref 134–144)
eGFR: 108 mL/min/1.73 (ref >59)

## 2023-10-24 ENCOUNTER — Telehealth: Payer: Self-pay

## 2023-10-24 ENCOUNTER — Other Ambulatory Visit (HOSPITAL_COMMUNITY): Payer: Self-pay

## 2023-10-24 NOTE — Progress Notes (Addendum)
 Pharmacy Medication Assistance Program Note    12/11/2023  Patient ID: Amanda Santiago, female   DOB: 07/05/1965, 58 y.o.   MRN: [id number]     10/24/2023  Outreach Medication One  Manufacturer Medication One Novo Nordisk  Nordisk Drugs Ozempic   Dose of Ozempic  2mg   Type of Sport and exercise psychologist  Date Application Received From Provider 11/12/2023  Date Application Submitted to Manufacturer 10/24/2023  Method Application Sent to Manufacturer Online     RENEWAL SUBMITTED [employer]  Provider pages faxed 11/12/23.

## 2023-10-24 NOTE — Telephone Encounter (Signed)
 Patient receives this medication through novo nordisk patient assistance program. It ships to the office from the company at no charge for the patient.   Her enrollment expires 11/24/23. Her last shipment (last refill) of the 1mg  pens was picked up from us  07/09/23 (for a 4 month supply).   I will go ahead and attempt her renewal online for the 2mg  dose pens (since this can be done 30 days before enrollment ends). If unsuccessful online, I will mail the application to her home.

## 2023-10-31 ENCOUNTER — Ambulatory Visit (INDEPENDENT_AMBULATORY_CARE_PROVIDER_SITE_OTHER): Payer: Self-pay | Admitting: Student

## 2023-10-31 ENCOUNTER — Ambulatory Visit: Payer: Self-pay

## 2023-10-31 VITALS — BP 147/87 | HR 80 | Temp 98.4°F | Ht 67.0 in | Wt 360.4 lb

## 2023-10-31 DIAGNOSIS — I10 Essential (primary) hypertension: Secondary | ICD-10-CM

## 2023-10-31 DIAGNOSIS — Z6841 Body Mass Index (BMI) 40.0 and over, adult: Secondary | ICD-10-CM

## 2023-10-31 DIAGNOSIS — M549 Dorsalgia, unspecified: Secondary | ICD-10-CM

## 2023-10-31 NOTE — Telephone Encounter (Addendum)
 Called pt and her son Milo who's requesting to be seen for upper back pain. Denies SOB,CP,H/a's as mentioned per CRM; stated he tried to explain to them. Stated the pain started last week , went away and started back yesterday. Stated Ibuprofen  helps a little. Appt given today @ 1545PM with Dr Celestina.

## 2023-10-31 NOTE — Progress Notes (Signed)
 CC: Acute visit for upper back pain  HPI:  Amanda Santiago is a 58 y.o. female living with a history stated below and presents today for acute onset of pain around the upper back.  She endorses pain  around the upper back, pain is non radiating, no recent trauma. She has tried NSAID for one day, helped somewhat.   Able to move the shoulder without any issues.   Please see problem based assessment and plan for additional details.  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    Fatty liver    Kidney stone 9-10 years ago   Kidney stone    Morbid obesity (HCC) 04/06/2014   BMI 50.6    Pain in joint, ankle and foot 04/06/2014   LEFT MRI 10/1015   Sternal pain 06/21/2014    Current Outpatient Medications on File Prior to Visit  Medication Sig Dispense Refill   acetaminophen  (TYLENOL ) 500 MG tablet Take 1 tablet (500 mg total) by mouth every 8 (eight) hours as needed. 100 tablet 0   Blood Glucose Monitoring Suppl (GLUCOCOM BLOOD GLUCOSE MONITOR) DEVI Check blood sugars as directed by provider.     diphenhydrAMINE (BENADRYL) 25 mg capsule Take 25 mg by mouth every 6 (six) hours as needed.     famotidine  (PEPCID ) 20 MG tablet Take 1 tablet (20 mg total) by mouth 2 (two) times daily. (Patient not taking: Reported on 08/28/2023) 30 tablet 0   fluticasone  (FLONASE ) 50 MCG/ACT nasal spray 1 spray into each nostril daily. (Patient not taking: Reported on 08/28/2023)     glucose blood (ON CALL EXPRESS BLOOD GLUCOSE) test strip Test blood sugar once daily     Lancet Devices (SIMPLE DIAGNOSTICS LANCING DEV) MISC Use to test blood sugar once daily     lisinopril  (ZESTRIL ) 10 MG tablet Take 1 tablet (10 mg total) by mouth daily. 30 tablet 11   metformin  (FORTAMET ) 500 MG (OSM) 24 hr tablet  Take 1 tablet (500 mg total) by mouth daily with breakfast. 90 tablet 0   NON FORMULARY Joint pain supplement from [place]. F.T.X Plus.     omeprazole (PRILOSEC) 10 MG capsule Take 10 mg by mouth daily.     rosuvastatin  (CRESTOR ) 5 MG tablet Take 1 tablet (5 mg total) by mouth daily. (Patient not taking: Reported on 08/28/2023) 30 tablet 11   Safety Let Lancets MISC Use to test blood sugar once daily     Semaglutide , 2 MG/DOSE, (OZEMPIC , 2 MG/DOSE,) 8 MG/3ML SOPN Inject 2 mg into the skin once a week. 3 mL 6   Vitamin D , Ergocalciferol , (DRISDOL ) 50000 units CAPS capsule Take 1 capsule (50,000 Units total) by mouth every 7 (seven) days. (Patient not taking: Reported on 08/28/2023) 12 capsule 0   Current Facility-Administered Medications on File Prior to Visit  Medication Dose Route Frequency Provider Last Rate Last Admin   0.9 %  sodium chloride  infusion  500 mL Intravenous Continuous Nandigam, Kavitha V, MD        Review of Systems: ROS negative except for what is noted on the assessment and plan.  Vitals:   10/31/23 1555 10/31/23 1608  BP: (!) 163/90 (!) 147/87  Pulse: 80 80  Temp: 98.4 F (36.9 C)   TempSrc: Oral   SpO2: 90%   Weight: ROLLEN)  360 lb 6.4 oz (163.5 kg)   Height: 5' 7 (1.702 m)       Physical Exam: Constitutional: NAD Cardiovascular: RRR, no murmurs. Pulmonary/Chest: Clear bilateral lungs Abdominal: soft, non-tender, non-distended. MSK: Focal tenderness near right rhomboid major muscle. No swelling or erythema. Shoulder with normal range of motion.  Assessment & Plan:   Patient discussed with Dr. Karna  Assessment & Plan Upper back pain on right side Suspect muscle strain, as she has focal tenderness near the rhomboid muscle.  Discussed conservative measures including heat/cold compresses, return gel and NSAIDs. - Continue treatment as above.  Morbid obesity (HCC) Tolerating Ozempic  1 mg, lost about 50 to 60 pounds.  Will increase dose to 2 mg/week. Primary  hypertension Both initial and repeat blood pressures were elevated. At her previous visit, blood pressure was well controlled. Current elevation may be secondary to muscle strain pain; therefore, we will hold off on adjusting her antihypertensive regimen. Outpatient medications include amlodipine 5 mg daily. Plan:  - Check BMP at next office visit. - Continue with amlodipine 5 mg   No orders of the defined types were placed in this encounter.   Missy Sandhoff, MD [facility], PGY-2  Date 11/03/2023 Time 9:40 PM

## 2023-10-31 NOTE — Telephone Encounter (Signed)
 FYI Only or Action Required?: Action required by provider: Refusing ED, requesting appt, needs call back to pt son Milo [phone] to speak to pt and son, alerted CAL to ED refusal.  Patient was last seen in primary care on 09/12/2023 by Bernadine Manos, MD.  Called Nurse Triage reporting Nausea, Shortness of Breath, prior headache, upper back pain, and interrupted sleep.  Symptoms began last week, went away, came back yesterday.  Interventions attempted: OTC medications: OTC pain meds and Rest, hydration, or home remedies.  Symptoms are: staying the same, significant.  Triage Disposition: Go to ED Now (or PCP Triage)  Patient/caregiver understands and will follow disposition?: No, refuses disposition      Copied from CRM #[id number]. Topic: Clinical - Red Word Triage >> Oct 31, 2023  8:35 AM Susanna ORN wrote: Red Word that prompted transfer to Nurse Triage: Patient's son, Amanda Santiago, along with patient, calling in stating that patient is having back pain that runs through her right side. States it happened last week & then went away, but now the pain started again on yesterday and is still there. States her pain level is 6 or 7/10. Reason for Disposition  Patient sounds very sick or weak to the triager    Confirms nausea and her breathing has been off during this time of upper back pain, prior headache with these symptoms as well  Answer Assessment - Initial Assessment Questions Son Milo on phone with pt in background  1. ONSET: When did the pain begin? (e.g., minutes, hours, days)     Last week then went away, started again yesterday and been there since 2. LOCATION: Where does it hurt? (upper, mid or lower back)     Back into her arm, upper back right side, right arm Then they state no arm pain at all, son states he misunderstood 3. SEVERITY: How bad is the pain?  (e.g., Scale 1-10; mild, moderate, or severe)     Stayed consistent not worsening pain, 6-7/10 4.  PATTERN: Is the pain constant? (e.g., yes, no; constant, intermittent)      Constant at this point, firm  5. RADIATION: Does the pain shoot into your legs or somewhere else?     Confirms no pain into arms, neck, or chest 7. BACK OVERUSE:  Any recent lifting of heavy objects, strenuous work or exercise?     Not that she's aware of 8. MEDICINES: What have you taken so far for the pain? (e.g., nothing, acetaminophen , NSAIDS)     Taken OTC meds but not helped 9. NEUROLOGIC SYMPTOMS: Do you have any weakness, numbness, or problems with bowel/bladder control?     No weakness Had headache around same time last week as back pain, but right now no No changes to bowel/bladder control 10. OTHER SYMPTOMS: Do you have any other symptoms? (e.g., fever, abdomen pain, burning with urination, blood in urine)       No fever, abdominal pain, numbness/tingling Been nauseous, last week had it for day or 2 when got back pain, went away also, now nausea with back pain 2 days ago wasn't able to sleep at all No excessive sweating, vomiting Sometimes has to take deeper breath, harder to get breath sometimes, been couple days Hx HTN per pt chart  Doesn't want to go to hospital and wait for hours with nothing done, wants to see doc   Advised ED, pt refusing at this time, preferring appt with PCP office, advised ED especially if any worsening or new symptoms,  sending message to office for call back to pt son Milo [phone] to speak to pt and son, alerted CAL to ED refusal  Protocols used: Back Pain-A-AH

## 2023-10-31 NOTE — Assessment & Plan Note (Addendum)
 Suspect muscle strain, as she has focal tenderness near the rhomboid muscle.  Discussed conservative measures including heat/cold compresses, return gel and NSAIDs. - Continue treatment as above.

## 2023-10-31 NOTE — Assessment & Plan Note (Addendum)
 Tolerating Ozempic  1 mg, lost about 50 to 60 pounds.  Will increase dose to 2 mg/week.

## 2023-10-31 NOTE — Assessment & Plan Note (Addendum)
 Both initial and repeat blood pressures were elevated. At her previous visit, blood pressure was well controlled. Current elevation may be secondary to muscle strain pain; therefore, we will hold off on adjusting her antihypertensive regimen. Outpatient medications include amlodipine 5 mg daily. Plan:  - Check BMP at next office visit. - Continue with amlodipine 5 mg

## 2023-10-31 NOTE — Patient Instructions (Addendum)
 It was a pleasure taking care of you today!    Fue un placer atenderle hoy!  El dolor en la parte superior de su espalda se debe a una distensin muscular. Puede tomar Tylenol  1000 mg hasta 3-4 veces al da para Chief Technology Officer. Tambin puede comprar Voltaren  en gel, que est disponible sin receta, para aplicarlo en la zona ms sensible.  He aumentado su Ozempic  a 2 mg.     Follow up: 3 months   Should you have any questions or concerns please call the internal medicine clinic at [phone].     Missy Sandhoff, MD  [facility]

## 2023-11-01 ENCOUNTER — Other Ambulatory Visit (HOSPITAL_COMMUNITY): Payer: Self-pay

## 2023-11-03 MED ORDER — OZEMPIC (2 MG/DOSE) 8 MG/3ML ~~LOC~~ SOPN
2.0000 mg | PEN_INJECTOR | SUBCUTANEOUS | 6 refills | Status: DC
  Filled 2023-11-03 – 2023-11-18 (×5): qty 3, 28d supply, fill #0
  Filled 2023-12-13: qty 3, 28d supply, fill #1

## 2023-11-04 ENCOUNTER — Other Ambulatory Visit (HOSPITAL_COMMUNITY): Payer: Self-pay

## 2023-11-04 ENCOUNTER — Telehealth: Payer: Self-pay | Admitting: *Deleted

## 2023-11-04 ENCOUNTER — Other Ambulatory Visit: Payer: Self-pay

## 2023-11-04 DIAGNOSIS — E119 Type 2 diabetes mellitus without complications: Secondary | ICD-10-CM

## 2023-11-04 MED ORDER — METFORMIN HCL ER 500 MG PO TB24
500.0000 mg | ORAL_TABLET | Freq: Every day | ORAL | 0 refills | Status: DC
Start: 2023-11-04 — End: 2023-11-18
  Filled 2023-11-04: qty 90, 90d supply, fill #0

## 2023-11-04 NOTE — Progress Notes (Signed)
 Internal Medicine Clinic Attending  I was physically present during the key portions of the resident provided service and participated in the medical decision making of patient's management care. I reviewed pertinent patient test results.  The assessment, diagnosis, and plan were formulated together and I agree with the documentation in the resident's note.  Dickie La, MD

## 2023-11-04 NOTE — Telephone Encounter (Signed)
 Pharmacist reached out about metformin .  Addressed this via secure chat.  They wanted the formulation that was cheaper.

## 2023-11-04 NOTE — Telephone Encounter (Signed)
 All from patient and son to check on Ozempic . Ozempic  has not arrived through patient assistance.  Asking about refill on the Metformin  will sent to pharmacy.  Patient to be call and informed when it arrives in the Clinic.  Refill for the Metformin  was sent to the [facility] at [address].

## 2023-11-05 ENCOUNTER — Other Ambulatory Visit: Payer: Self-pay

## 2023-11-05 NOTE — Progress Notes (Deleted)
 Wisewoman Re-screening   Interpreter- ***   Clinical Measurement: There were no vitals filed for this visit. Fasting Labs Drawn Today, will review with patient when they result.   Medical History: Patient states that she {Blank single:19197::has,does not have,does not know if she has} high cholesterol, {Blank single:19197::has,does not have,does not know if she has} high blood pressure and she {Blank single:19197::has,does not have,does not know if she has} diabetes. Patient states that she {Blank single:19197::has,does not have,does not know if she has} history of gestational hypertension, {Blank single:19197::has,does not have,does not know if she has} history of pre-eclampsia/eclampsia and she {Blank single:19197::has,does not have,does not know if she has} history of gestational diabetes.    Medications: Patient states that she {Blank single:19197::does,does not} take medication to lower cholesterol, blood pressure or blood sugar.  Patient {Blank single:19197::does,does not} take an aspirin a day to help prevent a heart attack or stroke. During the past 7 days patient {Blank single:19197::has,has not,N/A} taken prescribed medication to lower {Blank single:19197::cholesterol,blood pressure,blood sugar} on *** days.   Blood pressure, self measurement: Patient states that she {Blank single:19197::does,does not,does not have the equipment to} measure blood pressure from home. She checks her blood pressure {Blank single:19197::multiple times per day,daily,a few times per week,weekly,monthly,N/A}. She shares her readings with a health care provider: {Blank single:19197::yes,no,N/A}.   Nutrition: Patient states that on average she eats *** cups of fruit and *** cups of vegetables per day. Patient states that she {Blank single:19197::does,does not} eat fish at least 2 times per week. Patient eats {Blank single:19197::less  than half,about half,more than half} servings of whole grains. Patient drinks less than 36 ounces of beverages with added sugar weekly: {Blank single:19197::yes,no}. Patient is currently watching sodium or salt intake: {Blank single:19197::yes,no}. In the past 7 days patient has consumed drinks containing alcohol on *** days. On a day that patient consumes drinks containing alcohol on average *** drinks are consumed.      Physical activity: Patient states that she gets *** minutes of physical activity each week.  Smoking status: Patient states that she has {Blank single:19197::has never smoked,is a former smoker, quit 1-12 months ago,is a former smoker, quit 12+ months ago,is a current smoker} .   Quality of life: Over the past 2 weeks patient states that she had little interest or pleasure in doing things: {Blank single:19197::not at all,several days,more than half,nearly everyday}. She has been feeling down, depressed or hopeless:{Blank single:19197::not at all,several days,more than half,nearly everyday}.   Social Determinants of Health Assessment:   Computer Use: During the last 12 months patient states that she has used any of the following: desktop/laptop, smart phone or tablet/other portable wireless computer: {Blank single:19197::yes,no,don't know}.   Internet Use: During the last 12 months, did you or any member of your household have access to the internet: {Blank single:19197::Yes, by paying a cell phone company or internet service provider,Yes, without paying a cell phone company or internet service provider,No access to internet in this house,apartment or mobile home}.   Food Insecurities: During the last 12 months, where there any times when you were worried that you would run out of food because of a lack of money or other resources: {Blank single:19197::Yes,No}.   Transportation Barriers: During the last 12 months, have you missed a  doctor's appointment because of transportation problems: {Blank single:19197::Yes,No}.   Childcare Barriers: If you are currently using childcare services, please identify  the type of services you use. (If not using childcare services, please select Not applicable): {Blank single:19197::infant (birth  to 11 months,toddler (11 to 36 months),preschool (3 to 5 years),after school care (K-9th grade),not applicable}. During the last 12 months, have you had any barriers to childcare services such as: {Blank single:19197::cost,availability,location,transportation,hours of operation,other,not applicable}.   Housing: What is your housing situation today: {Blank single:19197::I have housing,I have housing, but I am worried about losing my housing,I do not have housing}.   Intimate Partner Violence: During the last 12 months, how often did your partner physically hurt you: {Blank single:19197::never,rarely,sometimes,fairly often,frequently,don't want to answer}. During the last 12 months, how often did your partner insult you or talk down to you: {Blank single:19197::never,rarely,sometimes,fairly often,frequently,don't want to answer}.  Medication Adherence: During the last 12 months, did you ever forget to take your medicine: {Blank single:19197::Yes,No,not applicable}. During the last 12 months, were you careless ar times about taking your medicine: {Blank single:19197::Yes,No,not applicable}. During the last 12 months, when you felt better did you sometimes stop taking your medication: {Blank single:19197::Yes,No,not applicable}. During the last 12 months, sometimes if you felt worse when you took your medicine did you stop taking it: {Blank single:19197::Yes,No,not applicable}.   Risk reduction and counseling:   Health Coaching:  Goal:   Navigation:  I will notify patient of lab results.  Patient is aware of 2 more health  coaching sessions and a follow up.

## 2023-11-06 ENCOUNTER — Inpatient Hospital Stay

## 2023-11-06 ENCOUNTER — Other Ambulatory Visit: Payer: Self-pay

## 2023-11-06 DIAGNOSIS — Z0000 Encounter for general adult medical examination without abnormal findings: Secondary | ICD-10-CM

## 2023-11-13 NOTE — Telephone Encounter (Signed)
 PCP pages signed and faxed 11/13/23

## 2023-11-18 ENCOUNTER — Other Ambulatory Visit: Payer: Self-pay

## 2023-11-18 ENCOUNTER — Other Ambulatory Visit (HOSPITAL_COMMUNITY): Payer: Self-pay

## 2023-11-18 ENCOUNTER — Other Ambulatory Visit: Payer: Self-pay | Admitting: Student

## 2023-11-18 DIAGNOSIS — E119 Type 2 diabetes mellitus without complications: Secondary | ICD-10-CM

## 2023-11-18 MED ORDER — METFORMIN HCL ER 500 MG PO TB24
500.0000 mg | ORAL_TABLET | Freq: Every day | ORAL | 0 refills | Status: DC
  Filled 2023-11-18: qty 90, 90d supply, fill #0

## 2023-11-18 NOTE — Telephone Encounter (Signed)
 Milo Sheela Sprung [phone] (patients son) - reached out regarding mothers Ozempic  pens/ enrollment.   Informed him that her renewal is pending. Says the [facility] pharmacy has one more sample pen they are able to give. I let him know we also have some sample 2mg  dose pens available if she ends up needing more before he shipment arrives.  Will follow up with him as soon as the order arrives, or if we receive anything from the company via mail/fax.

## 2023-11-19 NOTE — Progress Notes (Addendum)
 Wisewoman Re-screening   Interpreter- Bernice Angry, [place]   Clinical Measurement:  Vitals:   11/20/23 0916 11/20/23 0943  BP: 127/76 127/73   Fasting Labs Drawn Today, will review with patient when they result.   Medical History: Patient states that she has high cholesterol, has high blood pressure and she has diabetes. Patient states that she does not have history of gestational hypertension, does not have history of pre-eclampsia/eclampsia and she has history of gestational diabetes.    Medications: Patient states that she does take medication to lower cholesterol, blood pressure or blood sugar.  Patient does not take an aspirin a day to help prevent a heart attack or stroke. During the past 7 days patient has taken prescribed medication to lower cholesterol, blood pressure and blood sugar on 7 days.   Blood pressure, self measurement: Patient states that she does not measure blood pressure from home. She checks her blood pressure N/A. She shares her readings with a health care provider: N/A.   Nutrition: Patient states that on average she eats 1 cups of fruit and 2-3 cups of vegetables per day. Patient states that she does not eat fish at least 2 times per week. Patient eats less than half servings of whole grains. Patient drinks less than 36 ounces of beverages with added sugar weekly: yes. Patient is currently watching sodium or salt intake: yes. In the past 7 days patient has consumed drinks containing alcohol on 0 days. On a day that patient consumes drinks containing alcohol on average 0 drinks are consumed.      Physical activity: Patient states that she gets 0 minutes of physical activity each week.  Smoking status: Patient states that she has is a former smoker, quit 12+ months ago .   Quality of life: Over the past 2 weeks patient states that she had little interest or pleasure in doing things: not at all. She has been feeling down, depressed or hopeless:not at all.   Social  Determinants of Health Assessment:   Computer Use: During the last 12 months patient states that she has used any of the following: desktop/laptop, smart phone or tablet/other portable wireless computer: yes.   Internet Use: During the last 12 months, did you or any member of your household have access to the internet: Yes, by paying a cell phone company or internet service provider.   Food Insecurities: During the last 12 months, where there any times when you were worried that you would run out of food because of a lack of money or other resources: No.   Transportation Barriers: During the last 12 months, have you missed a doctor's appointment because of transportation problems: No.   Childcare Barriers: If you are currently using childcare services, please identify  the type of services you use. (If not using childcare services, please select Not applicable): not applicable. During the last 12 months, have you had any barriers to childcare services such as: not applicable.   Housing: What is your housing situation today: I have housing.   Intimate Partner Violence: During the last 12 months, how often did your partner physically hurt you: no partner. During the last 12 months, how often did your partner insult you or talk down to you: no partner.  Medication Adherence: During the last 12 months, did you ever forget to take your medicine: No. During the last 12 months, were you careless ar times about taking your medicine: No. During the last 12 months, when you felt better did  you sometimes stop taking your medication: No. During the last 12 months, sometimes if you felt worse when you took your medicine did you stop taking it: No.   Risk reduction and counseling:   Health Coaching: Spoke with patient about the daily recommendation for fruits and vegetables. Showed patient what a serving size would look like. Patient does not consume fish often. Explained to patient how fish is a lean  protein and is part of a heart healthy diet. Gave suggestions for fish high in omega-3's (salmon, tuna, mackerel, sardines, sea bass or trout). Patient states that she has not been exercising recently but has had better mobility recently and has been able to move around easier. Patient has just started ozempic  to aid in weight loss per her doctor's recommendation.   Goal: Goal not set. Will FU during her next health coaching session.   Navigation:  I will notify patient of lab results.  Patient is aware of 2 more health coaching sessions and a follow up.

## 2023-11-20 ENCOUNTER — Other Ambulatory Visit: Payer: Self-pay | Admitting: *Deleted

## 2023-11-20 ENCOUNTER — Inpatient Hospital Stay: Attending: Obstetrics and Gynecology | Admitting: *Deleted

## 2023-11-20 ENCOUNTER — Other Ambulatory Visit: Payer: Self-pay

## 2023-11-20 VITALS — BP 127/73 | Ht 67.0 in | Wt 363.2 lb

## 2023-11-20 DIAGNOSIS — Z0000 Encounter for general adult medical examination without abnormal findings: Secondary | ICD-10-CM

## 2023-11-20 DIAGNOSIS — E119 Type 2 diabetes mellitus without complications: Secondary | ICD-10-CM

## 2023-11-20 MED ORDER — METFORMIN HCL ER 500 MG PO TB24
500.0000 mg | ORAL_TABLET | Freq: Every day | ORAL | 0 refills | Status: DC
  Filled 2023-11-20: qty 90, 90d supply, fill #0

## 2023-11-20 MED ORDER — LISINOPRIL 10 MG PO TABS
10.0000 mg | ORAL_TABLET | Freq: Every day | ORAL | 3 refills | Status: DC
  Filled 2023-11-20: qty 30, 30d supply, fill #0

## 2023-11-20 MED ORDER — ROSUVASTATIN CALCIUM 5 MG PO TABS
5.0000 mg | ORAL_TABLET | Freq: Every day | ORAL | 3 refills | Status: DC
  Filled 2023-11-20: qty 30, 30d supply, fill #0

## 2023-11-20 NOTE — Telephone Encounter (Signed)
 Call to patient through [facility] [employer] to see what medications needed to be refilled.  Spoke to patient through [place] [phone].  Patient requested refills on her Lisinopril  amnd her Crestor .  Both were sent to the [facility] at [facility]

## 2023-11-21 ENCOUNTER — Other Ambulatory Visit: Payer: Self-pay

## 2023-11-21 LAB — LIPID PANEL
Chol/HDL Ratio: 1.8 ratio (ref 0.0–4.4)
Cholesterol, Total: 120 mg/dL (ref 100–199)
HDL: 66 mg/dL (ref >39)
LDL Chol Calc (NIH): 39 mg/dL (ref 0–99)
Triglycerides: 77 mg/dL (ref 0–149)
VLDL Cholesterol Cal: 15 mg/dL (ref 5–40)

## 2023-11-21 LAB — GLUCOSE, RANDOM: Glucose: 150 mg/dL — ABNORMAL HIGH (ref 70–99)

## 2023-11-27 ENCOUNTER — Ambulatory Visit: Payer: Self-pay

## 2023-11-27 NOTE — Telephone Encounter (Signed)
 Health coaching 2   interpreter- [employer] [phone]   Labs- 120 cholesterol, 39 LDL cholesterol, 77 triglycerides, 66 HDL cholesterol, 150 mean plasma glucose. Patient understands and is aware of her lab results.   Goals- 1. Continue with medications as prescribed by your doctor. 2. Practice heart healthy diet by watching the amount of fried and fatty foods consumed, daily fruit and vegetable intake, increase heart healthy fish intake and whole grain intake.  3. Continue to watch the amount of sweet and sugary foods and drinks consumed as well as carbohydrate rich foods. 4. Try to get some form of daily exercise in. Even if it is only for a short amount of time.   Navigation:  Patient is aware of 1 more health coaching sessions and a follow up. Patient is scheduled for FU with Internal Medicine on Monday, December 16, 2023 @ 1:15 pm.

## 2023-12-11 NOTE — Telephone Encounter (Signed)
 Per [employer]:   Last shipment processed 11/18/23.  Renewal application hasn't been processed. Application incomplete. Household size and proof of income needed.

## 2023-12-11 NOTE — Telephone Encounter (Signed)
 Medication Samples have been provided to the patient.  Drug name: OZEMPIC        Strength: 2MG  DOSE PENS        Qty: 1PEN  LOT: [id number]  Exp.Date: 09/08/25  Dosing instructions: INJECT 2MG  ONCE WEEKLY  Amanda Santiago 11:48 AM 12/11/2023

## 2023-12-11 NOTE — Telephone Encounter (Signed)
 Patient son Milo called back 12/10/23 to follow up on re-enrollment.   Informed him I will provide his mother up with Ozempic  2mg  samples while I follow up with [employer]ordisk on where her renewal stands.

## 2023-12-13 ENCOUNTER — Other Ambulatory Visit: Payer: Self-pay

## 2023-12-16 ENCOUNTER — Other Ambulatory Visit (HOSPITAL_COMMUNITY)
Admission: RE | Admit: 2023-12-16 | Discharge: 2023-12-16 | Disposition: A | Discharge status: Home / Self Care | Payer: Self-pay | Source: Ambulatory Visit

## 2023-12-16 ENCOUNTER — Other Ambulatory Visit: Payer: Self-pay

## 2023-12-16 ENCOUNTER — Ambulatory Visit: Payer: Self-pay | Admitting: Student

## 2023-12-16 ENCOUNTER — Encounter: Payer: Self-pay | Admitting: Student

## 2023-12-16 VITALS — BP 129/66 | HR 75 | Temp 98.9°F | Ht 67.0 in | Wt 363.0 lb

## 2023-12-16 DIAGNOSIS — Z124 Encounter for screening for malignant neoplasm of cervix: Secondary | ICD-10-CM

## 2023-12-16 DIAGNOSIS — Z23 Encounter for immunization: Secondary | ICD-10-CM

## 2023-12-16 DIAGNOSIS — I10 Essential (primary) hypertension: Secondary | ICD-10-CM

## 2023-12-16 DIAGNOSIS — E119 Type 2 diabetes mellitus without complications: Secondary | ICD-10-CM

## 2023-12-16 LAB — POCT GLYCOSYLATED HEMOGLOBIN (HGB A1C): HbA1c, POC (controlled diabetic range): 7.3 % — AB (ref 0.0–7.0)

## 2023-12-16 LAB — GLUCOSE, CAPILLARY: Glucose-Capillary: 135 mg/dL — ABNORMAL HIGH (ref 70–99)

## 2023-12-16 MED ORDER — ROSUVASTATIN CALCIUM 5 MG PO TABS
5.0000 mg | ORAL_TABLET | Freq: Every day | ORAL | 3 refills | Status: AC
  Filled 2023-12-16: qty 90, 90d supply, fill #0
  Filled 2024-01-29 – 2024-03-24 (×4): qty 90, 90d supply, fill #1

## 2023-12-16 MED ORDER — OZEMPIC (2 MG/DOSE) 8 MG/3ML ~~LOC~~ SOPN
2.0000 mg | PEN_INJECTOR | SUBCUTANEOUS | 6 refills | Status: AC
Start: 2023-12-16 — End: ?
  Filled 2023-12-16 – 2024-03-24 (×3): qty 3, 28d supply, fill #0

## 2023-12-16 MED ORDER — METFORMIN HCL ER 500 MG PO TB24
1000.0000 mg | ORAL_TABLET | Freq: Two times a day (BID) | ORAL | 3 refills | Status: AC
  Filled 2023-12-16 – 2024-01-29 (×2): qty 360, 90d supply, fill #0

## 2023-12-16 MED ORDER — LISINOPRIL 10 MG PO TABS
10.0000 mg | ORAL_TABLET | Freq: Every day | ORAL | 3 refills | Status: AC
  Filled 2023-12-16: qty 30, 30d supply, fill #0
  Filled 2024-01-29: qty 30, 30d supply, fill #1
  Filled 2024-03-02 (×3): qty 30, 30d supply, fill #2
  Filled 2024-04-03 (×2): qty 30, 30d supply, fill #3

## 2023-12-16 NOTE — Assessment & Plan Note (Signed)
 Patient has a past medical history of type 2 diabetes mellitus.  Patient presents for diabetes follow-up.  A1c today is 7.3.  This is improved from 8.0 about 3 months ago.  Still not at goal.  Patient is currently on metformin  500 mg daily and Ozempic  2 mg weekly.  Despite being on Ozempic , patient has not been losing much weight.  She has been 363 pounds for the past 2-3 visits.  Counseled patient on diet and exercise today.  She states she will try to exercise more.  Referred her to nutritionist for proper nutrition.  Recently had lipid panel, with LDL at goal at 39.  Patient is due for ophthalmology exam.  Updated foot exam today.  Plan: - Titrate up metformin  to 1000 mg twice daily - Continue Ozempic  2 mg weekly - Refer to nutritionist Lessie) for education and nutrition counseling - Foot exam updated today - Patient on statin therapy with rosuvastatin  5 mg daily - Patient on ACE therapy with lisinopril  - Patient referred for retinal exam - Counseled patient on lifestyle modification

## 2023-12-16 NOTE — Patient Instructions (Addendum)
 Alvah, Gilder you for allowing me to take part in your care today.  Here are your instructions.  1. Here is how to want to take your metformin    Week 1: Start with metformin  500 mg (1 tablet) with breakfast.   Week 2: Continue to do 500 mg (1 tablet) with breakfast and start taking 500 mg (1 tablet) with dinner.    Week 3: Increase your morning dose to 1000 mg (2 tablets) with breakfast and continue 500 mg (1 tablet) with dinner.   Week 4 and on: Please do 1000 mg (2 tablets) in the morning with breakfast and 1000 mg (2 tablets) with dinner.    2. Please come back in 3 months.  3. We did your pap smear, I will call you with the results   4. I have referred you to our nutritionist   PLEASE BRING YOUR MEDICATIONS TO EVERY APPOINTMENT  Thank you, Dr. Tobie  If you have any other questions please contact the internal medicine clinic at [phone] If it is after hours, please call the [facility] at [phone] and then ask the person who picks up for the resident on call.    Semana 1: Comience con metformina 500 mg (1 comprimido) con el desayuno.  Semana 2: Contine con 500 mg (1 comprimido) con el desayuno y comience con 500 mg (1 comprimido) con la cena.  Semana 3: Aumente su dosis matutina a 1000 mg (2 comprimidos) con el desayuno y contine con 500 mg (1 comprimido) con la cena.  Semana 4 en adelante: Tome 1000 mg (2 comprimidos) por la maana con el desayuno y 1000 mg (2 comprimidos) con la cena.  2. Por favor, vuelva en 3 meses.  3. Le hicimos la citologa vaginal. Le llamar con los [place].  4. Ladora he derivado a nuestra nutricionista.  POR FAVOR, TRAIGA SUS MEDICAMENTOS A TODAS SUS CITA  Gracias, Dr. Tobie  Si tiene alguna otra pregunta, comunquese con la clnica de medicina interna al [phone]. Si es fuera del horario de [place], llame al hospital [facility] al [phone] y luego pregunte a la persona que recoge al residente de  guardia.

## 2023-12-16 NOTE — Telephone Encounter (Signed)
 Spoke with patients son Israel.   Informed him of the addtl info needed, as well as the samples available here at the office.

## 2023-12-16 NOTE — Progress Notes (Signed)
 CC: Diabetes and chronic condition follow-up  HPI:  [name] is a 58 y.o. female with past medical history of hypertension, hepatic steatosis, type 2 diabetes mellitus who presents for [facility] lab follow-up, diabetes follow-up, and chronic condition follow-up.  Please see assessment and plan for full HPI.  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    Fatty liver    Kidney stone 9-10 years ago   Kidney stone    Morbid obesity (HCC) 04/06/2014   BMI 50.6    Pain in joint, ankle and foot 04/06/2014   LEFT MRI 10/1015   Sternal pain 06/21/2014     Current Outpatient Medications:    Blood Glucose Monitoring Suppl (GLUCOCOM BLOOD GLUCOSE MONITOR) DEVI, Check blood sugars as directed by provider., Disp: , Rfl:    glucose blood (ON CALL EXPRESS BLOOD GLUCOSE) test strip, Test blood sugar once daily, Disp: , Rfl:    Lancet Devices (SIMPLE DIAGNOSTICS LANCING DEV) MISC, Use to test blood sugar once daily, Disp: , Rfl:    lisinopril  (ZESTRIL ) 10 MG tablet, Take 1 tablet (10 mg total) by mouth daily., Disp: 90 tablet, Rfl: 3   metFORMIN  (GLUCOPHAGE -XR) 500 MG 24 hr tablet, Take 2 tablets (1,000 mg total) by mouth 2 (two) times daily with a meal., Disp: 360 tablet, Rfl: 3   NON FORMULARY, Joint pain supplement from [place]. F.T.X Plus., Disp: , Rfl:    rosuvastatin  (CRESTOR ) 5 MG tablet, Take 1 tablet (5 mg total) by mouth daily., Disp: 90 tablet, Rfl: 3   Safety Let Lancets MISC, Use to test blood sugar once daily, Disp: , Rfl:    Semaglutide , 2 MG/DOSE, (OZEMPIC , 2 MG/DOSE,) 8 MG/3ML SOPN, Inject 2 mg into the skin once a week.,  Disp: 3 mL, Rfl: 6  Review of Systems:    Negative except for what is stated in HPI  Physical Exam:  Vitals:   12/16/23 1317  BP: 129/66  Pulse: 75  Temp: 98.9 F (37.2 C)  TempSrc: Oral  SpO2: 94%  Weight: (!) 363 lb (164.7 kg)  Height: 5' 7 (1.702 m)   General: Patient is sitting comfortably in the room  Cardio: Regular rate and rhythm, no murmurs, rubs or gallops Pulmonary: Clear to ausculation bilaterally with no rales, rhonchi, and crackles  GU: Normal external genitalia, chaperone present, no obvious drainage or lesions noted on internal exam Extremities: Bilateral lower extremities with sensation intact on monofilament test.  2+ pedal pulses.  No signs of ulcers.   Assessment & Plan:   Assessment & Plan Diabetes mellitus without complication [facility]) Patient has a past medical history of type 2 diabetes mellitus.  Patient presents for diabetes follow-up.  A1c today is 7.3.  This is improved from 8.0 about 3 months ago.  Still not at goal.  Patient is currently on metformin  500 mg daily and Ozempic  2 mg weekly.  Despite being on Ozempic , patient has not been losing much weight.  She has been 363 pounds for the past 2-3 visits.  Counseled patient on diet and exercise today.  She states she will try to exercise more.  Referred her to nutritionist for proper nutrition.  Recently  had lipid panel, with LDL at goal at 39.  Patient is due for ophthalmology exam.  Updated foot exam today.  Plan: - Titrate up metformin  to 1000 mg twice daily - Continue Ozempic  2 mg weekly - Refer to nutritionist Lessie) for education and nutrition counseling - Foot exam updated today - Patient on statin therapy with rosuvastatin  5 mg daily - Patient on ACE therapy with lisinopril  - Patient referred for retinal exam - Counseled patient on lifestyle modification Cervical cancer screening Updated Pap smear today.  Plan: - Follow-up Pap smear result Vaccine for streptococcus pneumoniae and  influenza Patient received pneumonia vaccine and flu vaccine today. Primary hypertension Blood pressure medication at this time include lisinopril  10 mg daily.  Well-controlled at this time.  Blood pressure today was 129/66.  Plan: - Continue lisinopril  10 mg daily   Patient discussed with Dr. Shawn Libby Blanch, DO Internal Medicine Resident PGY-3

## 2023-12-16 NOTE — Progress Notes (Signed)
 Internal Medicine Clinic Attending  Case discussed with the resident at the time of the visit.  We reviewed the resident's history and exam and pertinent patient test results.  I agree with the assessment, diagnosis, and plan of care documented in the resident's note.

## 2023-12-16 NOTE — Assessment & Plan Note (Addendum)
 Blood pressure medication at this time include lisinopril  10 mg daily.  Well-controlled at this time.  Blood pressure today was 129/66.  Plan: - Continue lisinopril  10 mg daily

## 2023-12-17 NOTE — Addendum Note (Signed)
 Addended by: TOBIE GAINES on: 12/17/2023 07:31 AM   Modules accepted: Level of Service

## 2023-12-18 LAB — CYTOLOGY - PAP
Adequacy: ABSENT
Comment: NEGATIVE
Diagnosis: UNDETERMINED — AB
High risk HPV: NEGATIVE

## 2023-12-19 ENCOUNTER — Ambulatory Visit: Payer: Self-pay | Admitting: Student

## 2023-12-19 ENCOUNTER — Other Ambulatory Visit: Payer: Self-pay

## 2023-12-24 NOTE — Telephone Encounter (Signed)
 Will return sample to stock. Patients last shipment arrived.  Renewal still pending.

## 2023-12-26 ENCOUNTER — Telehealth: Payer: Self-pay

## 2023-12-26 NOTE — Telephone Encounter (Signed)
 Last shipment (re-enrollment pending)  4 boxes of Ozempic  2mg  dose pens arrived 12/13/23.

## 2024-01-09 ENCOUNTER — Other Ambulatory Visit: Payer: Self-pay

## 2024-01-09 NOTE — Telephone Encounter (Signed)
 Pt's son picked up 4 boxes of Ozempic  2 mg.

## 2024-01-15 NOTE — Telephone Encounter (Signed)
 Faxed proof of income and household size.  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

## 2024-01-22 NOTE — Telephone Encounter (Signed)
 Followed up on re-enrollment.  Per rep: patient approved 01/22/24 - 12/11/24  Next shipment processes 02/11/24

## 2024-01-29 ENCOUNTER — Other Ambulatory Visit: Payer: Self-pay

## 2024-01-29 ENCOUNTER — Ambulatory Visit: Admitting: Dietician

## 2024-01-29 ENCOUNTER — Other Ambulatory Visit (HOSPITAL_COMMUNITY): Payer: Self-pay

## 2024-02-03 ENCOUNTER — Ambulatory Visit: Admitting: Dietician

## 2024-02-13 ENCOUNTER — Ambulatory Visit: Payer: Self-pay

## 2024-02-13 ENCOUNTER — Ambulatory Visit: Admitting: Dietician

## 2024-02-13 NOTE — Telephone Encounter (Signed)
 FYI Only or Action Required?: FYI only for provider: appointment scheduled on 02/14/24.  Patient was last seen in primary care on 12/16/2023 by Tobie Gaines, DO.  Called Nurse Triage reporting Cough and Generalized Body Aches.  Symptoms began a week ago.  Interventions attempted: OTC medications: Ibuprofen , Acetaminophen .  Symptoms are: gradually worsening.  Triage Disposition: See HCP Within 4 Hours (Or PCP Triage)  Patient/caregiver understands and will follow disposition?: Yes  Reason for Disposition  [1] Fever > 100 F (37.8 C) AND [2] diabetes mellitus or weak immune system (e.g., HIV positive, cancer chemo, splenectomy, organ transplant, chronic steroids)  Answer Assessment - Initial Assessment Questions Triage completed using interpreter services, Amanda Santiago ID# [id number]. Patient calls in to cancel an appt, but states she has a fever and body aches. Patient states these symptoms started last week, but improved a bit. Yesterday when she got home from work she had chills with fever. Later she was nauseous and vomited clear phlegm. Today she has body aches with fever. She has taken Ibuprofen  and Acetaminophen . Pt advised to come in for appt today, but states she is unable to get transportation today. Appt made for tomorrow morning.   1. ONSET: When did the nasal discharge start?      Last week  2. AMOUNT: How much discharge is there?      Moderate  3. COUGH: Do you have a cough? If Yes, ask: Describe the color of your mucus. (e.g., clear, white, yellow, green)     Yes, productive with green mucus  4. RESPIRATORY DISTRESS: Describe your breathing.      No SOB  5. FEVER: Do you have a fever? If Yes, ask: What is your temperature, how was it measured, and when did it start?     Yes, 101F this morning  6. SEVERITY: Overall, how bad are you feeling right now? (e.g., doesn't interfere with normal activities, staying home from school/work, staying in bed)      Staying home  from work  7. OTHER SYMPTOMS: Do you have any other symptoms? (e.g., earache, mouth sores, sore throat, wheezing)     Body aches, nausea  8. PREGNANCY: Is there any chance you are pregnant? When was your last menstrual period?     NA  Protocols used: Common Cold-A-AH   Copied from CRM #[id number]. Topic: Clinical - Red Word Triage >> Feb 13, 2024 12:12 PM Amanda Santiago wrote: [facility] that prompted transfer to Nurse Triage: fever 101 - took medicine and was throwing up. and is coughing a lot.    Pt has appt today but wants to cancel.

## 2024-02-14 ENCOUNTER — Ambulatory Visit: Payer: Self-pay | Admitting: Student

## 2024-03-02 ENCOUNTER — Other Ambulatory Visit: Payer: Self-pay

## 2024-03-11 ENCOUNTER — Other Ambulatory Visit: Payer: Self-pay

## 2024-03-23 ENCOUNTER — Other Ambulatory Visit: Payer: Self-pay

## 2024-03-24 ENCOUNTER — Other Ambulatory Visit: Payer: Self-pay

## 2024-04-03 ENCOUNTER — Other Ambulatory Visit: Payer: Self-pay
